# Patient Record
Sex: Female | Born: 1990 | Race: Black or African American | Hispanic: No | Marital: Single | Smoking: Former smoker
Health system: Southern US, Community
[De-identification: ages and names within clinical notes are randomized; demographics above are authoritative.]

## PROBLEM LIST (undated history)

## (undated) DIAGNOSIS — F329 Major depressive disorder, single episode, unspecified: Secondary | ICD-10-CM

## (undated) DIAGNOSIS — F4310 Post-traumatic stress disorder, unspecified: Secondary | ICD-10-CM

## (undated) DIAGNOSIS — S0300XA Dislocation of jaw, unspecified side, initial encounter: Secondary | ICD-10-CM

## (undated) DIAGNOSIS — J45909 Unspecified asthma, uncomplicated: Secondary | ICD-10-CM

## (undated) DIAGNOSIS — L309 Dermatitis, unspecified: Secondary | ICD-10-CM

## (undated) DIAGNOSIS — F909 Attention-deficit hyperactivity disorder, unspecified type: Secondary | ICD-10-CM

## (undated) DIAGNOSIS — F419 Anxiety disorder, unspecified: Secondary | ICD-10-CM

## (undated) DIAGNOSIS — F32A Depression, unspecified: Secondary | ICD-10-CM

## (undated) HISTORY — PX: FOOT SURGERY: SHX648

## (undated) HISTORY — PX: OTHER SURGICAL HISTORY: SHX169

## (undated) HISTORY — DX: Dermatitis, unspecified: L30.9

## (undated) HISTORY — DX: Major depressive disorder, single episode, unspecified: F32.9

## (undated) HISTORY — DX: Depression, unspecified: F32.A

## (undated) HISTORY — DX: Post-traumatic stress disorder, unspecified: F43.10

## (undated) HISTORY — DX: Anxiety disorder, unspecified: F41.9

## (undated) HISTORY — DX: Attention-deficit hyperactivity disorder, unspecified type: F90.9

## (undated) HISTORY — DX: Dislocation of jaw, unspecified side, initial encounter: S03.00XA

---
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
## 2009-03-04 ENCOUNTER — Ambulatory Visit: Payer: Self-pay | Admitting: Internal Medicine

## 2009-03-19 ENCOUNTER — Ambulatory Visit: Payer: Self-pay | Admitting: Internal Medicine

## 2009-04-17 ENCOUNTER — Ambulatory Visit: Payer: Self-pay | Admitting: Internal Medicine

## 2010-01-17 ENCOUNTER — Emergency Department (HOSPITAL_COMMUNITY): Admission: EM | Admit: 2010-01-17 | Discharge: 2010-01-17 | Payer: Self-pay | Admitting: Emergency Medicine

## 2011-09-14 ENCOUNTER — Telehealth: Payer: Self-pay

## 2011-09-14 NOTE — Telephone Encounter (Signed)
Pt is requesting refill on generic ritalin  Call [phone]

## 2011-09-15 MED ORDER — METHYLPHENIDATE HCL 10 MG PO TABS: 10.0000 mg | ORAL_TABLET | Freq: Two times a day (BID) | ORAL | Status: DC

## 2011-09-15 NOTE — Telephone Encounter (Signed)
LMOM THAT RX IS READY FOR PICKUP 

## 2011-09-15 NOTE — Telephone Encounter (Signed)
Pt last seen for this 07/04/11. OK to RF x 6 mos.

## 2011-09-15 NOTE — Telephone Encounter (Signed)
RF on Ritalin.

## 2011-10-09 ENCOUNTER — Ambulatory Visit (INDEPENDENT_AMBULATORY_CARE_PROVIDER_SITE_OTHER): Payer: 59 | Admitting: Family Medicine

## 2011-10-09 VITALS — BP 126/77 | HR 108 | Temp 99.8°F | Resp 16 | Ht 66.0 in | Wt 233.6 lb

## 2011-10-09 DIAGNOSIS — F329 Major depressive disorder, single episode, unspecified: Secondary | ICD-10-CM

## 2011-10-09 DIAGNOSIS — R059 Cough, unspecified: Secondary | ICD-10-CM

## 2011-10-09 DIAGNOSIS — J310 Chronic rhinitis: Secondary | ICD-10-CM

## 2011-10-09 DIAGNOSIS — F3289 Other specified depressive episodes: Secondary | ICD-10-CM

## 2011-10-09 DIAGNOSIS — R112 Nausea with vomiting, unspecified: Secondary | ICD-10-CM

## 2011-10-09 DIAGNOSIS — F32A Depression, unspecified: Secondary | ICD-10-CM

## 2011-10-09 DIAGNOSIS — J329 Chronic sinusitis, unspecified: Secondary | ICD-10-CM

## 2011-10-09 DIAGNOSIS — R05 Cough: Secondary | ICD-10-CM

## 2011-10-09 MED ORDER — BENZONATATE 100 MG PO CAPS: 100.0000 mg | ORAL_CAPSULE | Freq: Two times a day (BID) | ORAL | Status: AC | PRN

## 2011-10-09 MED ORDER — BUPROPION HCL ER (SR) 150 MG PO TB12: 150.0000 mg | ORAL_TABLET | Freq: Every day | ORAL | Status: DC

## 2011-10-09 MED ORDER — PROMETHAZINE HCL 12.5 MG PO TABS: 12.5000 mg | ORAL_TABLET | Freq: Three times a day (TID) | ORAL | Status: AC | PRN

## 2011-10-09 MED ORDER — AZITHROMYCIN 250 MG PO TABS: ORAL_TABLET | ORAL | Status: AC

## 2011-10-09 NOTE — Progress Notes (Signed)
[facility]:  Office Visit  Chief Complaint:  Chief Complaint  Patient presents with  . Cough  . Emesis  . Medication Problem    side effects from celexa    HPI: Morgan [name] is a 21 y.o. female who complains of   1.Flu like sxs  X 6 days, sore throat, coughing up white mucus, nausea and vomiting, subjective fevers/chills, sinus pressure.  LMP 09/11/11. Has not had sex in last 1 month.  2. She would like to change her Celexa because if sideeffects. She had spoken to Maralyn Sago about this and they ahad agreed that perhaps going back to her Wellbutrin would help with her depression and the SE of weight gain and derease libido with the Celexa. She has been on Wellbutrin before and it seemed to work, does not remember why she was taken off of it. Does not appear to be related to an allergy. Denies mania, SI/HI, hallucinations.   Past Medical History  Diagnosis Date  . Depression   . ADHD (attention deficit hyperactivity disorder)   . Anxiety   . PTSD (post-traumatic stress disorder)   . Eczema   . TMJ (dislocation of temporomandibular joint)    Past Surgical History  Procedure Date  . Addenoid    History   Social History  . Marital Status: Single    Spouse Name: N/A    Number of Children: N/A  . Years of Education: N/A   Social History Main Topics  . Smoking status: Never Smoker   . Smokeless tobacco: None  . Alcohol Use: No  . Drug Use: No  . Sexually Active: Not Currently    Birth Control/ Protection: Condom   Other Topics Concern  . None   Social History Narrative  . None   Family History  Problem Relation Age of Onset  . Depression Mother   . Hypertension Mother   . Hypertension Maternal Grandmother   . Hypertension Maternal Grandfather   . Diabetes Maternal Grandfather   . Hypertension Paternal Grandmother   . Diabetes Paternal Grandmother    Allergies  Allergen Reactions  . Sulfa Antibiotics Itching   Prior to Admission medications     Medication Sig Start Date End Date Taking? Authorizing Provider  citalopram (CELEXA) 20 MG tablet Take 20 mg by mouth daily.   Yes Historical Provider, MD  methylphenidate (RITALIN) 10 MG tablet Take 1 tablet (10 mg total) by mouth 2 (two) times daily. 09/15/11 10/15/11 Yes Rickard Patience, PA-C     ROS: The patient denies fevers, chills, night sweats, unintentional weight loss, chest pain, palpitations, wheezing, dyspnea on exertion, abdominal pain, dysuria, hematuria, melena, numbness, weakness, or tingling. + nausea/vomiting  All other systems have been reviewed and were otherwise negative with the exception of those mentioned in the HPI and as above.    PHYSICAL EXAM: Filed Vitals:   10/09/11 1351  BP: 126/77  Pulse: 108  Temp: 99.8 F (37.7 C)  Resp: 16   Filed Vitals:   10/09/11 1351  Height: 5\' 6"  (1.676 m)  Weight: 233 lb 9.6 oz (105.96 kg)   Body mass index is 37.70 kg/(m^2).  General: Alert, no acute distress, obese HEENT:  Normocephalic, atraumatic, oropharynx patent. TM nl, Sinus tender, boggy nares Cardiovascular:  Regular rate and rhythm, no rubs murmurs or gallops.  No Carotid bruits, radial pulse intact. No pedal edema.  Respiratory: Clear to auscultation bilaterally.  No wheezes, rales, or rhonchi.  No cyanosis, no use of accessory  musculature GI: No organomegaly, abdomen is soft and non-tender, positive bowel sounds.  No masses. Skin: No rashes. Neurologic: Facial musculature symmetric. Psychiatric: Patient is appropriate throughout our interaction. Lymphatic: No cervical lymphadenopathy Musculoskeletal: Gait intact.   LABS: No results found for this or any previous visit.   EKG/XRAY:   Primary read interpreted by Dr. Conley Rolls at [facility].   ASSESSMENT/PLAN: Encounter Diagnoses  Name Primary?  . Sinusitis Yes  . Rhinitis   . Cough   . Nausea & vomiting   . Depression     1. Rx Z pack 2. Nasal flushes 3. Rx Tessalon Perles 4. Rx. Phenergen 5. Change  meds from Celexa to Wellbutrin XR 150 mg daily  F/u in 1 month for reevaluation of Wellbutrin effectiveness and SEs if any   Taylie Helder PHUONG, DO 10/09/2011 2:45 PM

## 2011-10-26 ENCOUNTER — Telehealth: Payer: Self-pay

## 2011-10-26 NOTE — Telephone Encounter (Signed)
PT REQUESTING RITALIN REFILL    BEST PHONE [phone]

## 2011-10-28 MED ORDER — METHYLPHENIDATE HCL 10 MG PO TABS: 10.0000 mg | ORAL_TABLET | Freq: Two times a day (BID) | ORAL | Status: DC

## 2011-10-28 NOTE — Telephone Encounter (Signed)
Ritalin RX is ready.

## 2011-10-28 NOTE — Telephone Encounter (Signed)
Ritalin RX ready

## 2011-10-28 NOTE — Telephone Encounter (Signed)
[facility] notifying patient that rx is in p/up drawer.

## 2011-11-03 ENCOUNTER — Ambulatory Visit (INDEPENDENT_AMBULATORY_CARE_PROVIDER_SITE_OTHER): Payer: 59 | Admitting: Emergency Medicine

## 2011-11-03 VITALS — BP 106/72 | HR 118 | Temp 100.3°F | Resp 16 | Ht 65.5 in | Wt 228.0 lb

## 2011-11-03 DIAGNOSIS — F909 Attention-deficit hyperactivity disorder, unspecified type: Secondary | ICD-10-CM

## 2011-11-03 DIAGNOSIS — R1110 Vomiting, unspecified: Secondary | ICD-10-CM

## 2011-11-03 DIAGNOSIS — F329 Major depressive disorder, single episode, unspecified: Secondary | ICD-10-CM

## 2011-11-03 MED ORDER — ONDANSETRON 8 MG PO TBDP: 8.0000 mg | ORAL_TABLET | Freq: Three times a day (TID) | ORAL | Status: AC | PRN

## 2011-11-03 MED ORDER — ONDANSETRON 4 MG PO TBDP
8.0000 mg | ORAL_TABLET | Freq: Once | ORAL | Status: AC
  Administered 2011-11-03: 8 mg via ORAL

## 2011-11-03 NOTE — Progress Notes (Signed)
  Subjective:    Patient ID: Morgan [name], female    DOB: April 05, 1991, 21 y.o.   MRN: [id number]  HPI patient enters with onset approximately midnight of nausea vomiting followed by diarrhea. Her boyfriend was sick last week with a similar type illness she has not had any recent travel but she did take a course of Zithromax 10/09/1911. She's had frequent loose stools through the day today.    Review of Systems she has a history of ADHD and a history of depression on treatment.     Objective:   Physical Exam  Constitutional:       Patient appears ill but nontoxic. Mucous membranes are moist.  HENT:  Head: Normocephalic.  Right Ear: External ear normal.  Left Ear: External ear normal.  Eyes: Pupils are equal, round, and reactive to light.  Neck: Neck supple. No tracheal deviation present. No thyromegaly present.  Cardiovascular: Normal rate and regular rhythm.   Pulmonary/Chest: Breath sounds normal. No respiratory distress. She has no wheezes. She has no rales. She exhibits no tenderness.  Abdominal: Soft. She exhibits no distension and no mass. There is no tenderness. There is no rebound and no guarding.          Assessment & Plan:   Patient presents with fever myalgias vomiting and diarrhea. I doubt this represents C. difficile. She has not any blood or mucus in her diarrhea and her antibiotic was a Z-Pak. Her boyfriend was ill with a similar illness recently.

## 2011-11-03 NOTE — Patient Instructions (Signed)

## 2011-11-30 ENCOUNTER — Ambulatory Visit (INDEPENDENT_AMBULATORY_CARE_PROVIDER_SITE_OTHER): Payer: 59 | Admitting: Family Medicine

## 2011-11-30 VITALS — BP 115/74 | HR 98 | Temp 98.3°F | Resp 18 | Ht 66.0 in | Wt 232.0 lb

## 2011-11-30 DIAGNOSIS — F4310 Post-traumatic stress disorder, unspecified: Secondary | ICD-10-CM

## 2011-11-30 MED ORDER — LAMOTRIGINE 25 MG PO TABS: 25.0000 mg | ORAL_TABLET | Freq: Two times a day (BID) | ORAL | Status: DC

## 2011-11-30 NOTE — Patient Instructions (Signed)
Post-Traumatic Stress Disorder  If you have been diagnosed with post-traumatic stress disorder (PTSD), you have probably experienced a traumatic event in your life. These events are usually outside of the range of normal human experience and would negatively impact any normal person.   CAUSES   A person can get PTSD after living through or seeing a dangerous event such as:   An automobile accident.   War.   Natural disaster.   Rape.   Domestic violence.   Any event where there has been a threat to life.  PTSD is a real illness. PTSD Can happen to anyone at any age. Children get PTSD too. A doctor, or mental health professional with experience in treating PTSD can help you.  SYMPTOMS   Not all symptoms may be present in any one person.   Distressing dreams.   Flashback: feeling the frightening event is happening again.   Avoiding activities, places, and people that remind you of the event.   Avoiding thoughts and feelings associated with the event.   Having frightening thoughts you cannot control.   Feeling on the edge with increased alertness and vigilance.   Trouble sleeping.   Feeling alone, detached from others.   Angry outbursts.   Feeling worried, guilty, or sad.   Having thoughts of hurting yourself or others.  PTSD may start soon after a frightening event or months or years later. Many war veterans have PTSD. Drinking alcohol or using drugs will not help PTSD and may even make it worse.   TREATMENT   PTSD can be treated. Treatment may include "talk" therapy, medicine, or both. Either a doctor or a mental health professional who is experienced in treating PTSD can help you. Early diagnosis and treatment is best and can show more rapid improvement. Get help if you or a loved one are thinking of hurting yourself. Call your local emergency medical services if you need help immediately.  Document Released: 04/12/2001 Document Revised: 07/07/2011 Document Reviewed: 03/26/2008  [employer] Patient  Information 2012 [employer].
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
## 2011-12-18 ENCOUNTER — Ambulatory Visit (INDEPENDENT_AMBULATORY_CARE_PROVIDER_SITE_OTHER): Payer: 59 | Admitting: Family Medicine

## 2011-12-18 VITALS — BP 118/73 | HR 94 | Temp 98.0°F | Resp 16 | Ht 66.0 in | Wt 235.0 lb

## 2011-12-18 DIAGNOSIS — Z79899 Other long term (current) drug therapy: Secondary | ICD-10-CM

## 2011-12-18 DIAGNOSIS — F4310 Post-traumatic stress disorder, unspecified: Secondary | ICD-10-CM

## 2011-12-18 DIAGNOSIS — G4733 Obstructive sleep apnea (adult) (pediatric): Secondary | ICD-10-CM

## 2011-12-18 DIAGNOSIS — Z5189 Encounter for other specified aftercare: Secondary | ICD-10-CM

## 2011-12-18 MED ORDER — LAMOTRIGINE 25 MG PO TABS: 50.0000 mg | ORAL_TABLET | Freq: Two times a day (BID) | ORAL | Status: DC

## 2011-12-18 NOTE — Progress Notes (Signed)
Patient Name: Morgan [name] Date of Birth: January 20, 1991 Medical Record Number: [id number] Gender: female Date of Encounter: 12/18/2011  History of Present Illness:  Morgan [name] is a 21 y.o. very pleasant female patient who presents with the following:  Here to follow- up on her PTSD/ depression.  She was here on the 1st and started on lamictal.  She has still not called the psychiatrist whose contact information she was given by Dr. Milus Glazier.  She feels that the lamictal is helping to stabilize her moods some, but she still will fluctuate between feeling a bit "up" and then "down" every 3 or 4 days.  However, she does not feel that either her ups or her downs are very severe.  She denies any SI or HI.    Also concernred about snoring- her bed partner has noted that she snores a lot and seems to stop breathing sometimes at night.  She has been told that she had a deviated septum in the past.    Patient Active Problem List  Diagnoses  . ADHD (attention deficit hyperactivity disorder)  . Depression   Past Medical History  Diagnosis Date  . Depression   . ADHD (attention deficit hyperactivity disorder)   . Anxiety   . PTSD (post-traumatic stress disorder)   . Eczema   . TMJ (dislocation of temporomandibular joint)    Past Surgical History  Procedure Date  . Addenoid    History  Substance Use Topics  . Smoking status: Never Smoker   . Smokeless tobacco: Not on file  . Alcohol Use: No   Family History  Problem Relation Age of Onset  . Depression Mother   . Hypertension Mother   . Hypertension Maternal Grandmother   . Hypertension Maternal Grandfather   . Diabetes Maternal Grandfather   . Hypertension Paternal Grandmother   . Diabetes Paternal Grandmother    Allergies  Allergen Reactions  . Sulfa Antibiotics Itching    Medication list has been reviewed and updated.  Review of Systems: As per HPI- otherwise negative.   Physical Examination: Filed Vitals:   12/18/11 1047  BP: 118/73  Pulse: 94  Temp: 98 F (36.7 C)  TempSrc: Oral  Resp: 16  Height: 5\' 6"  (1.676 m)  Weight: 235 lb (106.595 kg)    Body mass index is 37.93 kg/(m^2).  GEN: WDWN, NAD, Non-toxic, A & O x 3, overweight- seems to give the impression of being heavier than she really is due to large breasts HEENT: Atraumatic, Normocephalic. Neck supple. No masses, No LAD.  TM, oropharynx wnl.  She has a small mouth and large tonsils.  Ears and Nose: No external deformity. CV: RRR, No M/G/R. No JVD. No thrill. No extra heart sounds. PULM: CTA B, no wheezes, crackles, rhonchi. No retractions. No resp. distress. No accessory muscle use. EXTR: No c/c/e NEURO Normal gait.  PSYCH: Normally interactive. Conversant. Not depressed or anxious appearing.  Calm demeanor.    Assessment and Plan: 1. PTSD (post-traumatic stress disorder)  lamoTRIgine (LAMICTAL) 25 MG tablet  2. OSA (obstructive sleep apnea)  Nocturnal polysomnography (NPSG)  3. Medication management  Basic metabolic panel   Did increase her lamictal to 50mg  BID today.  I explained that we do need to have a psychiatrist involved in her care, as managing depression with anti- epileptics should be done with at least the input of a psychiatrist.  She did agree to call tomorrow and work on setting up an appointment as soon as she can.  Wanted to  know about adding celexa to her regimen- again I let her know that a psychiatrist would be the best person to ask, and she understood  Check BMP to monitor creatinine (lamictal use) Referral for sleep study- suspect that she may have OSA.  Encouraged to lose a few pounds as well   Follow- up pending labs- Sooner if worse.

## 2011-12-19 LAB — BASIC METABOLIC PANEL
BUN: 10 mg/dL (ref 4–21)
BUN: 10 mg/dL (ref 4–21)
BUN: 10 mg/dL (ref 4–21)
Creatinine: 0.6 mg/dL (ref 0.5–1.1)
Potassium: 4.1 mmol/L (ref 3.4–5.3)

## 2011-12-23 ENCOUNTER — Encounter: Payer: Self-pay | Admitting: Family Medicine

## 2011-12-23 NOTE — Progress Notes (Signed)
These were sent to [facility]- will enter by hand

## 2012-01-30 ENCOUNTER — Telehealth: Payer: Self-pay

## 2012-01-30 MED ORDER — METHYLPHENIDATE HCL 10 MG PO TABS: 10.0000 mg | ORAL_TABLET | Freq: Two times a day (BID) | ORAL | Status: DC

## 2012-01-30 NOTE — Telephone Encounter (Signed)
Left message rx ready to be picked up

## 2012-01-30 NOTE — Telephone Encounter (Signed)
Done and printed

## 2012-01-30 NOTE — Telephone Encounter (Signed)
Pt saw Dr L 11/30/11 and addressed ADD. Pt last RF 10/28/11. Dr Patsy Lager did D/W pt need to start seeing a psychiatrist for her other issues at 5/19 OV.

## 2012-01-30 NOTE — Telephone Encounter (Signed)
PT IN NEED OF HER RITALIN PLEASE CALL [phone]

## 2012-02-11 ENCOUNTER — Encounter: Payer: Self-pay | Admitting: Family Medicine

## 2012-02-11 ENCOUNTER — Ambulatory Visit (INDEPENDENT_AMBULATORY_CARE_PROVIDER_SITE_OTHER): Payer: 59 | Admitting: Family Medicine

## 2012-02-11 VITALS — BP 116/74 | HR 81 | Temp 98.1°F | Resp 16 | Ht 65.5 in | Wt 244.0 lb

## 2012-02-11 DIAGNOSIS — F32A Depression, unspecified: Secondary | ICD-10-CM

## 2012-02-11 DIAGNOSIS — F419 Anxiety disorder, unspecified: Secondary | ICD-10-CM

## 2012-02-11 DIAGNOSIS — F411 Generalized anxiety disorder: Secondary | ICD-10-CM

## 2012-02-11 DIAGNOSIS — L309 Dermatitis, unspecified: Secondary | ICD-10-CM

## 2012-02-11 DIAGNOSIS — F329 Major depressive disorder, single episode, unspecified: Secondary | ICD-10-CM

## 2012-02-11 DIAGNOSIS — L259 Unspecified contact dermatitis, unspecified cause: Secondary | ICD-10-CM

## 2012-02-11 MED ORDER — CITALOPRAM HYDROBROMIDE 20 MG PO TABS: ORAL_TABLET | ORAL | Status: DC

## 2012-02-11 MED ORDER — TRIAMCINOLONE ACETONIDE 0.1 % EX LOTN: TOPICAL_LOTION | Freq: Two times a day (BID) | CUTANEOUS | Status: AC

## 2012-02-11 NOTE — Patient Instructions (Addendum)
Use the lotion twice daily on the areas of eczema. If it is not improving we will refer you to a dermatologist.  Take the Celexa one daily for 2 weeks, then increase to 2 daily if tolerated. In about 3-4 weeks I would like you to followup with Benny Lennert PA who has seen you in the past.  Consider getting some psychological counseling. I am giving you several names you can consider contacting, or else find someone else of your choosing.  Continue getting your regular physical exercise. Urge you to try to be physically, emotionally, relationally., and spiritually healthy.

## 2012-02-11 NOTE — Progress Notes (Signed)
Subjective: Patient has a history of extensive allergies. She's been allergy tested in the past and was positive for "everything". She has been having a lot of eczema in recent months. It is extensively on her right leg and on both thighs. She itches a lot. She does not know what causes the eczema to flare. She has used some triamcinolone cream in the past it appears.  She also is having problems with anxiety and depression. She wants to be back on her Celexa but says that it raised her weight. 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 She does try to walk for 3 miles in the evenings she has been more depressed. She is living with a significant other. She was raised in a Christian home, but apparently has had problems from the legalism from the church she was raised in. She would like a counselor, but doesn't feel she can afford a lot of the practices.  Objective: Eczema on legs as noted above.  Assessment: Eczema Anxiety Depression History of possible ADD  Plan:   Resume the Celexa 20 mg daily, but increase to 40 mg in 2 weeks. Followup with Ricka Burdock in one month. She considers her primary.  Triamcinolone lotion twice a day  If depression gets intensely worse anytime she is to come back. Recommended counseling.

## 2012-05-20 ENCOUNTER — Ambulatory Visit (INDEPENDENT_AMBULATORY_CARE_PROVIDER_SITE_OTHER): Payer: 59 | Admitting: Family Medicine

## 2012-05-20 VITALS — BP 119/77 | HR 90 | Temp 97.9°F | Resp 17 | Ht 66.5 in | Wt 232.0 lb

## 2012-05-20 DIAGNOSIS — J309 Allergic rhinitis, unspecified: Secondary | ICD-10-CM

## 2012-05-20 DIAGNOSIS — N946 Dysmenorrhea, unspecified: Secondary | ICD-10-CM

## 2012-05-20 DIAGNOSIS — Z3009 Encounter for other general counseling and advice on contraception: Secondary | ICD-10-CM

## 2012-05-20 DIAGNOSIS — R5383 Other fatigue: Secondary | ICD-10-CM

## 2012-05-20 DIAGNOSIS — R1114 Bilious vomiting: Secondary | ICD-10-CM

## 2012-05-20 LAB — GLUCOSE, POCT (MANUAL RESULT ENTRY): POC Glucose: 66 mg/dl — AB (ref 70–99)

## 2012-05-20 LAB — POCT CBC
Granulocyte percent: 59.5 %G (ref 37–80)
HCT, POC: 42.4 % (ref 37.7–47.9)
Hemoglobin: 12.9 g/dL (ref 12.2–16.2)
Lymph, poc: 2.6 (ref 0.6–3.4)
MCHC: 30.4 g/dL — AB (ref 31.8–35.4)
MCV: 84.3 fL (ref 80–97)
POC Granulocyte: 4.6 (ref 2–6.9)

## 2012-05-20 LAB — POCT URINE PREGNANCY: Preg Test, Ur: NEGATIVE

## 2012-05-20 MED ORDER — FLUTICASONE PROPIONATE 50 MCG/ACT NA SUSP: 2.0000 | Freq: Every day | NASAL | Status: DC

## 2012-05-20 MED ORDER — ETONOGESTREL-ETHINYL ESTRADIOL 0.12-0.015 MG/24HR VA RING: VAGINAL_RING | VAGINAL | Status: DC

## 2012-05-20 NOTE — Patient Instructions (Addendum)
Call an OB- GYN office of your choice and make an appointment.  You may want to research Implanon and the Mirena and Paraguard IUD prior to your call, as prior approval from your insurance may be required.  That way, when you call you can tell them what you are most interested in using.    [facility] [place].

## 2012-05-20 NOTE — Progress Notes (Signed)
[facility] [address], [place] [address] [phone]  Date:  05/20/2012   Name:  Morgan [name]   DOB:  [birth year]   MRN:  [id number]  PCP:  Tally Due, MD    Chief Complaint: Dizziness, Abdominal Cramping, Generalized Body Aches and Fatigue   History of Present Illness:  Morgan [name] is a 21 y.o. very pleasant female patient who presents with the following:  She has been "feeling terrible" for the last 5 or 6 days.  Her menses started this past Friday, and her flow has been waxing and waning.  Her menses came a couple of days earlier than expected but this is not unusual for her.   She has felt "faint, moody, fatigued and out of it."  She has noted abdominal cramps.   Also has noted diarrhea up to 3 times a day- this also is typical with her menses.    He current constellation of sympotms is similar to the symptoms that she usually has around the time of her menses.  However, this is worse than usual.  "I have not gotten up out of bed in days."   No urinary symtpoms.  No vomiting.  No breast tenderness.  No vaginal symptoms such as discharge.  She uses condoms for contraception.  She would like to go back on hormonal contraception if possible to help reduce her symptoms.    She last used OCP about 5 years ago and did not experience worsening of her headaches. See discussion of HA below.  She would also be interested in a more long- term form on contraception such as an IUD. She feels certain that she will never want to have children, but understands that she probably will not be given a BTL due to her age.    Patient Active Problem List  Diagnosis  . ADHD (attention deficit hyperactivity disorder)  . Depression    Past Medical History  Diagnosis Date  . Depression   . ADHD (attention deficit hyperactivity disorder)   . Anxiety   . PTSD (post-traumatic stress disorder)   . Eczema   . TMJ (dislocation of temporomandibular joint)     Past  Surgical History  Procedure Date  . Addenoid     History  Substance Use Topics  . Smoking status: Never Smoker   . Smokeless tobacco: Not on file  . Alcohol Use: No    Family History  Problem Relation Age of Onset  . Depression Mother   . Hypertension Mother   . Hypertension Maternal Grandmother   . Hypertension Maternal Grandfather   . Diabetes Maternal Grandfather   . Hypertension Paternal Grandmother   . Diabetes Paternal Grandmother     Allergies  Allergen Reactions  . Sulfa Antibiotics Itching    Medication list has been reviewed and updated.  Current Outpatient Prescriptions on File Prior to Visit  Medication Sig Dispense Refill  . citalopram (CELEXA) 20 MG tablet Take 1 daily for 2 weeks, then increase to 2 daily for anxiety and depression  60 tablet  2  . clonazePAM (KLONOPIN) 0.5 MG tablet Take 0.5 mg by mouth 2 (two) times daily as needed.      . Multiple Vitamin (MULTIVITAMIN) tablet Take 1 tablet by mouth daily.      Marland Kitchen triamcinolone lotion (KENALOG) 0.1 % Apply topically 2 (two) times daily.  60 mL  4  . buPROPion (WELLBUTRIN SR) 150 MG 12 hr tablet Take 1 tablet (150 mg total)  by mouth daily.  30 tablet  1  . lamoTRIgine (LAMICTAL) 25 MG tablet Take 2 tablets (50 mg total) by mouth 2 (two) times daily.  120 tablet  2  . methylphenidate (RITALIN) 10 MG tablet Take 1 tablet (10 mg total) by mouth 2 (two) times daily.  60 tablet  0    Review of Systems:  As per HPI- otherwise negative. Interested in the nuva- ring.  She does not smoke, no history of DVT/ PE, cancer or HTN.  She does get migraine HA, but it does not seem that she gets an aura. She does note sensitivity to light with her migraines, but only has visual changes "when I press on my eyes" which she does to relieve the HAs.    Physical Examination: Filed Vitals:   05/20/12 1034  BP: 119/77  Pulse: 90  Temp: 97.9 F (36.6 C)  Resp: 17   Filed Vitals:   05/20/12 1034  Height: 5' 6.5" (1.689  m)  Weight: 232 lb (105.235 kg)   Body mass index is 36.88 kg/(m^2). Ideal Body Weight: Weight in (lb) to have BMI = 25: 156.9   GEN: WDWN, NAD, Non-toxic, A & O x 3, obese HEENT: Atraumatic, Normocephalic. Neck supple. No masses, No LAD. PEERL, EOMI Ears and Nose: No external deformity. CV: RRR, No M/G/R. No JVD. No thrill. No extra heart sounds. PULM: CTA B, no wheezes, crackles, rhonchi. No retractions. No resp. distress. No accessory muscle use. ABD: S, NT, ND +BS. No rebound. No HSM.   abdominal exam is benign at this time EXTR: No c/c/e NEURO Normal gait.  PSYCH: Normally interactive. Conversant. Not depressed or anxious appearing.  Calm demeanor.   Results for orders placed in visit on 05/20/12  POCT CBC      Component Value Range   WBC 7.8  4.6 - 10.2 K/uL   Lymph, poc 2.6  0.6 - 3.4   POC LYMPH PERCENT 33.1  10 - 50 %L   MID (cbc) 0.6  0 - 0.9   POC MID % 7.4  0 - 12 %M   POC Granulocyte 4.6  2 - 6.9   Granulocyte percent 59.5  37 - 80 %G   RBC 5.03  4.04 - 5.48 M/uL   Hemoglobin 12.9  12.2 - 16.2 g/dL   HCT, POC 78.2  95.6 - 47.9 %   MCV 84.3  80 - 97 fL   MCH, POC 25.6 (*) 27 - 31.2 pg   MCHC 30.4 (*) 31.8 - 35.4 g/dL   RDW, POC 21.3     Platelet Count, POC 362  142 - 424 K/uL   MPV 9.9  0 - 99.8 fL  GLUCOSE, POCT (MANUAL RESULT ENTRY)      Component Value Range   POC Glucose 66 (*) 70 - 99 mg/dl  POCT URINE PREGNANCY      Component Value Range   Preg Test, Ur Negative     Given juice to drink in clinic- she felt better.  She is not driving home- she is being picked up.  Cautioned her to eat lunch before she tries to drive.    Assessment and Plan: 1. Fatigue  POCT CBC, POCT glucose (manual entry), POCT urine pregnancy  2. Dysmenorrhea    3. Counseling for birth control, oral contraceptives  etonogestrel-ethinyl estradiol (NUVARING) 0.12-0.015 MG/24HR vaginal ring  4. Allergic rhinitis  fluticasone (FLONASE) 50 MCG/ACT nasal spray   Morgan [name] is here today  with malaise associated with  menstruation.  She wishes to start nuvaring for birth control.  She denies any history of DVT/PE, migraine with aura, cancer, blood clotting disorder or tobacco use.  She can start the ring right away or wait until her menses are over.  Instructed as to importance of taking medication as directed, and encouraged to use condoms as well if at risk for STIs.   See pt instructions, she will talk to OBG about IUD, etc.    Also refilled her flonase per her request.    Abbe Amsterdam, MD

## 2012-06-14 ENCOUNTER — Emergency Department (HOSPITAL_COMMUNITY)
Admission: EM | Admit: 2012-06-14 | Discharge: 2012-06-14 | Disposition: A | Discharge status: Home / Self Care | Payer: 59 | Attending: Emergency Medicine | Admitting: Emergency Medicine

## 2012-06-14 ENCOUNTER — Encounter (HOSPITAL_COMMUNITY): Payer: Self-pay | Admitting: *Deleted

## 2012-06-14 DIAGNOSIS — Z79899 Other long term (current) drug therapy: Secondary | ICD-10-CM | POA: Insufficient documentation

## 2012-06-14 DIAGNOSIS — Y9389 Activity, other specified: Secondary | ICD-10-CM | POA: Insufficient documentation

## 2012-06-14 DIAGNOSIS — F3289 Other specified depressive episodes: Secondary | ICD-10-CM | POA: Insufficient documentation

## 2012-06-14 DIAGNOSIS — F329 Major depressive disorder, single episode, unspecified: Secondary | ICD-10-CM | POA: Insufficient documentation

## 2012-06-14 DIAGNOSIS — F4310 Post-traumatic stress disorder, unspecified: Secondary | ICD-10-CM | POA: Insufficient documentation

## 2012-06-14 DIAGNOSIS — S134XXA Sprain of ligaments of cervical spine, initial encounter: Secondary | ICD-10-CM

## 2012-06-14 DIAGNOSIS — S139XXA Sprain of joints and ligaments of unspecified parts of neck, initial encounter: Secondary | ICD-10-CM | POA: Insufficient documentation

## 2012-06-14 DIAGNOSIS — Z872 Personal history of diseases of the skin and subcutaneous tissue: Secondary | ICD-10-CM | POA: Insufficient documentation

## 2012-06-14 DIAGNOSIS — J45909 Unspecified asthma, uncomplicated: Secondary | ICD-10-CM | POA: Insufficient documentation

## 2012-06-14 DIAGNOSIS — Z8719 Personal history of other diseases of the digestive system: Secondary | ICD-10-CM | POA: Insufficient documentation

## 2012-06-14 DIAGNOSIS — F909 Attention-deficit hyperactivity disorder, unspecified type: Secondary | ICD-10-CM | POA: Insufficient documentation

## 2012-06-14 DIAGNOSIS — IMO0002 Reserved for concepts with insufficient information to code with codable children: Secondary | ICD-10-CM | POA: Insufficient documentation

## 2012-06-14 DIAGNOSIS — F411 Generalized anxiety disorder: Secondary | ICD-10-CM | POA: Insufficient documentation

## 2012-06-14 HISTORY — DX: Unspecified asthma, uncomplicated: J45.909

## 2012-06-14 MED ORDER — CYCLOBENZAPRINE HCL 10 MG PO TABS: 10.0000 mg | ORAL_TABLET | Freq: Two times a day (BID) | ORAL | Status: DC | PRN

## 2012-06-14 MED ORDER — OXYCODONE-ACETAMINOPHEN 5-325 MG PO TABS: 1.0000 | ORAL_TABLET | Freq: Four times a day (QID) | ORAL | Status: DC | PRN

## 2012-06-14 MED ORDER — OXYCODONE-ACETAMINOPHEN 5-325 MG PO TABS
1.0000 | ORAL_TABLET | Freq: Once | ORAL | Status: AC
  Administered 2012-06-14: 1 via ORAL
  Filled 2012-06-14: qty 1

## 2012-06-14 MED ORDER — LORAZEPAM 0.5 MG PO TABS
0.5000 mg | ORAL_TABLET | Freq: Once | ORAL | Status: AC
  Administered 2012-06-14: 0.5 mg via ORAL
  Filled 2012-06-14: qty 1

## 2012-06-14 MED ORDER — METHOCARBAMOL 500 MG PO TABS
500.0000 mg | ORAL_TABLET | Freq: Once | ORAL | Status: AC
  Administered 2012-06-14: 500 mg via ORAL
  Filled 2012-06-14: qty 1
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
## 2012-06-14 NOTE — ED Provider Notes (Signed)
Medical screening examination/treatment/procedure(s) were performed by non-physician practitioner and as supervising physician I was immediately available for consultation/collaboration.   Gwyneth Sprout, MD 06/14/12 1040
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
## 2012-09-09 ENCOUNTER — Ambulatory Visit (INDEPENDENT_AMBULATORY_CARE_PROVIDER_SITE_OTHER): Payer: 59 | Admitting: Family Medicine

## 2012-09-09 VITALS — BP 126/75 | HR 87 | Temp 99.1°F | Resp 16 | Ht 66.0 in | Wt 243.0 lb

## 2012-09-09 DIAGNOSIS — T7800XA Anaphylactic reaction due to unspecified food, initial encounter: Secondary | ICD-10-CM

## 2012-09-09 DIAGNOSIS — F329 Major depressive disorder, single episode, unspecified: Secondary | ICD-10-CM

## 2012-09-09 DIAGNOSIS — F411 Generalized anxiety disorder: Secondary | ICD-10-CM

## 2012-09-09 MED ORDER — CITALOPRAM HYDROBROMIDE 40 MG PO TABS: 60.0000 mg | ORAL_TABLET | Freq: Every day | ORAL | Status: DC

## 2012-09-09 MED ORDER — CLONAZEPAM 0.5 MG PO TABS: 0.5000 mg | ORAL_TABLET | Freq: Two times a day (BID) | ORAL | Status: DC | PRN

## 2012-09-09 MED ORDER — EPINEPHRINE 0.3 MG/0.3ML IJ DEVI: 0.3000 mg | Freq: Once | INTRAMUSCULAR | Status: DC

## 2012-09-09 NOTE — Progress Notes (Signed)
Subjective:    Patient ID: Morgan [name], female    DOB: 1990/09/17, 22 y.o.   MRN: [id number]  HPI  Has been seeing pyschaitrist - Trey Paula - at [facility] with Dr. Dub Mikes.   He increased her up to citalopram 60mg . She has not seen him recently due to expense. Does not think her medicaiton needs to be adjusted as she is doing a lot better since they increased her citalopram dose in Oct.  She still has occ anxiety and struggles w/ ADHD - still has trouble concentrated but coping.  Using klonopoin bid prn which just really depends on stress level in life. Does not sleep well but does get 8 hrs regularly - toss and turns all night and legs don't moving.  No improvement when she takes her klonopin at night.  Has tried melatonin which sometimes works but she is just used to it. Living with boyfriend of 3 yrs. Working full time as Manufacturing engineer.    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  Has bads periods - nuvaring and depo did not work from her. Wants to consider an IUD and will make an appt w/ gyn  No prob w/ asthma  Past Medical History  Diagnosis Date  . Depression   . ADHD (attention deficit hyperactivity disorder)   . Anxiety   . PTSD (post-traumatic stress disorder)   . Eczema   . TMJ (dislocation of temporomandibular joint)   . Asthma    Current Outpatient Prescriptions on File Prior to Visit  Medication Sig Dispense Refill  . citalopram (CELEXA) 40 MG tablet Take 60 mg by mouth daily.      . clonazePAM (KLONOPIN) 0.5 MG tablet Take 0.5 mg by mouth 2 (two) times daily as needed. For anxiety      . cyclobenzaprine (FLEXERIL) 10 MG tablet Take 1 tablet (10 mg total) by mouth 2 (two) times daily as needed for muscle spasms.  20 tablet  0  . cyclobenzaprine (FLEXERIL) 10 MG tablet Take 1 tablet (10 mg total) by mouth 2 (two) times daily as needed for muscle spasms.  20 tablet  0  . etonogestrel-ethinyl estradiol (NUVARING)  0.12-0.015 MG/24HR vaginal ring Insert vaginally and leave in place for 3 consecutive weeks, then remove for 1 week.  1 each  12  . fluticasone (FLONASE) 50 MCG/ACT nasal spray Place 2 sprays into the nose daily as needed. For allergies      . oxyCODONE-acetaminophen (PERCOCET) 5-325 MG per tablet Take 1 tablet by mouth every 6 (six) hours as needed for pain.  10 tablet  0  . oxyCODONE-acetaminophen (PERCOCET) 5-325 MG per tablet Take 1 tablet by mouth every 6 (six) hours as needed for pain.  15 tablet  0  . triamcinolone lotion (KENALOG) 0.1 % Apply topically 2 (two) times daily.  60 mL  4   No current facility-administered medications on file prior to visit.   Allergies  Allergen Reactions  . Food Anaphylaxis    Melons: any type in the melon family with cause anaphylaxis   . Peanut-Containing Drug Products Anaphylaxis  . Lactose Intolerance (Gi) Other (See Comments)    Reaction: GI upset  . Vitamin C Itching    Most food which have vitamin c causes itching  . Sulfa Antibiotics Rash    Childhood allergy      Review of Systems  Constitutional: Positive for fatigue. Negative for fever, chills, diaphoresis,  activity change, appetite change and unexpected weight change.  Respiratory: Negative for cough, shortness of breath and wheezing.   Cardiovascular: Negative for chest pain.  Gastrointestinal: Negative for nausea and vomiting.  Genitourinary: Positive for vaginal bleeding and menstrual problem.  Allergic/Immunologic: Positive for food allergies.  Psychiatric/Behavioral: Positive for sleep disturbance and decreased concentration. Negative for behavioral problems, confusion, dysphoric mood and agitation. The patient is nervous/anxious and is hyperactive.       BP 126/75  Pulse 87  Temp(Src) 99.1 F (37.3 C) (Oral)  Resp 16  Ht 5\' 6"  (1.676 m)  Wt 243 lb (110.224 kg)  BMI 39.24 kg/m2  SpO2 99% Objective:   Physical Exam  Constitutional: She is oriented to person, place, and  time. She appears well-developed and well-nourished. No distress.  HENT:  Head: Normocephalic and atraumatic.  Right Ear: External ear normal.  Left Ear: External ear normal.  Eyes: Conjunctivae are normal. No scleral icterus.  Neck: Normal range of motion. Neck supple. No thyromegaly present.  Cardiovascular: Normal rate, regular rhythm, normal heart sounds and intact distal pulses.   Pulmonary/Chest: Effort normal and breath sounds normal. No respiratory distress.  Musculoskeletal: She exhibits no edema.  Lymphadenopathy:    She has no cervical adenopathy.  Neurological: She is alert and oriented to person, place, and time.  Skin: Skin is warm and dry. She is not diaphoretic. No erythema.  Psychiatric: She has a normal mood and affect. Her behavior is normal.      Assessment & Plan:   1. Depression  citalopram (CELEXA) 40 MG tablet   citalopram (CELEXA) 40 MG tablet  2. Anxiety state, unspecified  clonazePAM (KLONOPIN) 0.5 MG tablet   clonazePAM (KLONOPIN) 0.5 MG tablet  3. Anaphylactic reaction due to food  EPINEPHrine (EPIPEN) 0.3 mg/0.3 mL DEVI   EPINEPHrine (EPIPEN) 0.3 mg/0.3 mL DEVI   Meds ordered this encounter  Medications  . citalopram (CELEXA) 40 MG tablet    Sig: Take 1.5 tablets (60 mg total) by mouth daily.    Dispense:  140 tablet    Refill:  1  . clonazePAM (KLONOPIN) 0.5 MG tablet    Sig: Take 1 tablet (0.5 mg total) by mouth 2 (two) times daily as needed. For anxiety    Dispense:  60 tablet    Refill:  3  . EPINEPHrine (EPIPEN) 0.3 mg/0.3 mL DEVI    Sig: Inject 0.3 mLs (0.3 mg total) into the muscle once.    Dispense:  2 Device    Refill:  2   Make appt w/ gyn for well-woman care and discuss IUD. If she needs meds adjusted, f/u w/ psych. If she needs refills, f/u in 6 mos.

## 2012-09-17 ENCOUNTER — Telehealth: Payer: Self-pay

## 2012-09-17 NOTE — Telephone Encounter (Signed)
I had completed a prior auth over the phone for pt's citalopram 40 mg, take 1 1/2 tabs QD on 09/12/12. Received denial of coverage for quantity over recommended dose. I have place denial notice in Dr Alver Fisher box for review.

## 2012-09-18 NOTE — Telephone Encounter (Signed)
Checked [facility] $4 list and Citalopram 40 mg is on their list as well.  Called pt and she stated that she could not really talk to me about her Rx at the time. Pt will call back when she is able to talk.

## 2012-09-18 NOTE — Telephone Encounter (Signed)
Citalopram is on the $4 list at [facility] - a likely [facility] as well so hopefully pt can just pay for it out of pocket - would be much easier than trying to taper her off and onto something else which might not work as well.

## 2012-09-26 NOTE — Telephone Encounter (Signed)
Called her again  Left detailed message

## 2012-11-20 ENCOUNTER — Telehealth: Payer: Self-pay

## 2012-11-20 NOTE — Telephone Encounter (Signed)
[facility] request refill on Klonopin 0.5 mg.

## 2012-11-20 NOTE — Telephone Encounter (Signed)
Pt given a 4 mo supply on 2/9 so shouldn't need refill until 6/9.  Has she been taking more than rx'ed or did she not know she had refills? If so, needs to f/u here or with psych to adjust dose and discuss importance of following directions on med bottle.

## 2012-11-21 NOTE — Telephone Encounter (Signed)
Called pharmacy and was advised that they did not have the RFs in the system. Pt has 2 RFs and they will fill it for her.

## 2013-04-05 ENCOUNTER — Ambulatory Visit (INDEPENDENT_AMBULATORY_CARE_PROVIDER_SITE_OTHER): Payer: 59 | Admitting: Emergency Medicine

## 2013-04-05 VITALS — BP 122/80 | HR 98 | Temp 99.8°F | Resp 16 | Ht 66.25 in | Wt 244.0 lb

## 2013-04-05 DIAGNOSIS — F329 Major depressive disorder, single episode, unspecified: Secondary | ICD-10-CM

## 2013-04-05 DIAGNOSIS — N912 Amenorrhea, unspecified: Secondary | ICD-10-CM

## 2013-04-05 DIAGNOSIS — F411 Generalized anxiety disorder: Secondary | ICD-10-CM

## 2013-04-05 LAB — POCT CBC
Hemoglobin: 12.1 g/dL — AB (ref 12.2–16.2)
MCH, POC: 25.9 pg — AB (ref 27–31.2)
MPV: 9.7 fL (ref 0–99.8)
POC MID %: 6.9 %M (ref 0–12)
RBC: 4.67 M/uL (ref 4.04–5.48)
WBC: 8.3 10*3/uL (ref 4.6–10.2)

## 2013-04-05 LAB — POCT URINE PREGNANCY: Preg Test, Ur: NEGATIVE

## 2013-04-05 MED ORDER — CLONAZEPAM 0.5 MG PO TABS: 0.5000 mg | ORAL_TABLET | Freq: Two times a day (BID) | ORAL | Status: DC | PRN

## 2013-04-05 MED ORDER — CITALOPRAM HYDROBROMIDE 40 MG PO TABS: 40.0000 mg | ORAL_TABLET | Freq: Every day | ORAL | Status: DC
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
## 2013-04-09 LAB — COMPREHENSIVE METABOLIC PANEL
AST: 13 IU/L (ref 0–40)
Albumin/Globulin Ratio: 1.5 (ref 1.1–2.5)
Albumin: 4.1 g/dL (ref 3.5–5.5)
BUN: 8 mg/dL (ref 6–20)
Calcium: 9.7 mg/dL (ref 8.7–10.2)
Creatinine, Ser: 0.71 mg/dL (ref 0.57–1.00)
GFR calc non Af Amer: 120 mL/min/{1.73_m2} (ref >59)
Globulin, Total: 2.8 g/dL (ref 1.5–4.5)
Sodium: 140 mmol/L (ref 134–144)
Total Protein: 6.9 g/dL (ref 6.0–8.5)

## 2013-04-16 ENCOUNTER — Ambulatory Visit (INDEPENDENT_AMBULATORY_CARE_PROVIDER_SITE_OTHER): Payer: 59 | Admitting: Family Medicine

## 2013-04-16 VITALS — BP 128/92 | HR 87 | Temp 98.8°F | Resp 18 | Ht 66.5 in | Wt 241.8 lb

## 2013-04-16 DIAGNOSIS — F32A Depression, unspecified: Secondary | ICD-10-CM

## 2013-04-16 DIAGNOSIS — F329 Major depressive disorder, single episode, unspecified: Secondary | ICD-10-CM

## 2013-04-16 DIAGNOSIS — F411 Generalized anxiety disorder: Secondary | ICD-10-CM

## 2013-04-16 NOTE — Progress Notes (Signed)
22 yo [employer] worker who lives with boyfriend.  She's had two months of insomnia, fatigue, and teary days.  No boyfriend problem.  No new stressors.  Her psychiatrist moved out of town, but she is still seeing a Veterinary surgeon.  She has psychiatric evaluation on Thursday.    She just cannot get motivated to go to work.  Positive F/H depression  Objective:  Crying during the interview. Results for orders placed in visit on 04/05/13  COMPREHENSIVE METABOLIC PANEL      Result Value Range   Glucose 70  65 - 99 mg/dL   BUN 8  6 - 20 mg/dL   Creatinine, Ser 1.61  0.57 - 1.00 mg/dL   GFR calc non Af Amer 120  >59 mL/min/1.73   GFR calc Af Amer 138  >59 mL/min/1.73   BUN/Creatinine Ratio 11  8 - 20   Sodium 140  134 - 144 mmol/L   Potassium 3.9  3.5 - 5.2 mmol/L   Chloride 103  97 - 108 mmol/L   CO2 23  18 - 29 mmol/L   Calcium 9.7  8.7 - 10.2 mg/dL   Total Protein 6.9  6.0 - 8.5 g/dL   Albumin 4.1  3.5 - 5.5 g/dL   Globulin, Total 2.8  1.5 - 4.5 g/dL   Albumin/Globulin Ratio 1.5  1.1 - 2.5   Total Bilirubin 0.2  0.0 - 1.2 mg/dL   Alkaline Phosphatase 76  39 - 117 IU/L   AST 13  0 - 40 IU/L   ALT 13  0 - 32 IU/L  TSH      Result Value Range   TSH 0.592  0.450 - 4.500 uIU/mL  POCT URINE PREGNANCY      Result Value Range   Preg Test, Ur Negative    POCT CBC      Result Value Range   WBC 8.3  4.6 - 10.2 K/uL   Lymph, poc 2.6  0.6 - 3.4   POC LYMPH PERCENT 31.1  10 - 50 %L   MID (cbc) 0.6  0 - 0.9   POC MID % 6.9  0 - 12 %M   POC Granulocyte 5.1  2 - 6.9   Granulocyte percent 62.0  37 - 80 %G   RBC 4.67  4.04 - 5.48 M/uL   Hemoglobin 12.1 (*) 12.2 - 16.2 g/dL   HCT, POC 09.6  04.5 - 47.9 %   MCV 83.6  80 - 97 fL   MCH, POC 25.9 (*) 27 - 31.2 pg   MCHC 31.0 (*) 31.8 - 35.4 g/dL   RDW, POC 40.9     Platelet Count, POC 319  142 - 424 K/uL   MPV 9.7  0 - 99.8 fL   Denies suicidal ideation  Assessment:  Major depression  Plan: psychiatric evaluation on Thursday OOW until  Monday.  Signed,  [name],  MD

## 2013-04-16 NOTE — Patient Instructions (Signed)
Results for orders placed in visit on 04/05/13  COMPREHENSIVE METABOLIC PANEL      Result Value Range   Glucose 70  65 - 99 mg/dL   BUN 8  6 - 20 mg/dL   Creatinine, Ser 1.61  0.57 - 1.00 mg/dL   GFR calc non Af Amer 120  >59 mL/min/1.73   GFR calc Af Amer 138  >59 mL/min/1.73   BUN/Creatinine Ratio 11  8 - 20   Sodium 140  134 - 144 mmol/L   Potassium 3.9  3.5 - 5.2 mmol/L   Chloride 103  97 - 108 mmol/L   CO2 23  18 - 29 mmol/L   Calcium 9.7  8.7 - 10.2 mg/dL   Total Protein 6.9  6.0 - 8.5 g/dL   Albumin 4.1  3.5 - 5.5 g/dL   Globulin, Total 2.8  1.5 - 4.5 g/dL   Albumin/Globulin Ratio 1.5  1.1 - 2.5   Total Bilirubin 0.2  0.0 - 1.2 mg/dL   Alkaline Phosphatase 76  39 - 117 IU/L   AST 13  0 - 40 IU/L   ALT 13  0 - 32 IU/L  TSH      Result Value Range   TSH 0.592  0.450 - 4.500 uIU/mL  POCT URINE PREGNANCY      Result Value Range   Preg Test, Ur Negative    POCT CBC      Result Value Range   WBC 8.3  4.6 - 10.2 K/uL   Lymph, poc 2.6  0.6 - 3.4   POC LYMPH PERCENT 31.1  10 - 50 %L   MID (cbc) 0.6  0 - 0.9   POC MID % 6.9  0 - 12 %M   POC Granulocyte 5.1  2 - 6.9   Granulocyte percent 62.0  37 - 80 %G   RBC 4.67  4.04 - 5.48 M/uL   Hemoglobin 12.1 (*) 12.2 - 16.2 g/dL   HCT, POC 09.6  04.5 - 47.9 %   MCV 83.6  80 - 97 fL   MCH, POC 25.9 (*) 27 - 31.2 pg   MCHC 31.0 (*) 31.8 - 35.4 g/dL   RDW, POC 40.9     Platelet Count, POC 319  142 - 424 K/uL   MPV 9.7  0 - 99.8 fL   Depression, Adult Depression refers to feeling sad, low, down in the dumps, blue, gloomy, or empty. In general, there are two kinds of depression: 1. Depression that we all experience from time to time because of upsetting life experiences, including the loss of a job or the ending of a relationship (normal sadness or normal grief). This kind of depression is considered normal, is short lived, and resolves within a few days to 2 weeks. (Depression experienced after the loss of a loved one is called  bereavement. Bereavement often lasts longer than 2 weeks but normally gets better with time.) 2. Clinical depression, which lasts longer than normal sadness or normal grief or interferes with your ability to function at home, at work, and in school. It also interferes with your personal relationships. It affects almost every aspect of your life. Clinical depression is an illness. Symptoms of depression also can be caused by conditions other than normal sadness and grief or clinical depression. Examples of these conditions are listed as follows:  Physical illness Some physical illnesses, including underactive thyroid gland (hypothyroidism), severe anemia, specific types of cancer, diabetes, uncontrolled seizures, heart and lung problems, strokes,  and chronic pain are commonly associated with symptoms of depression.  Side effects of some prescription medicine In some people, certain types of prescription medicine can cause symptoms of depression.  Substance abuse Abuse of alcohol and illicit drugs can cause symptoms of depression. SYMPTOMS Symptoms of normal sadness and normal grief include the following:  Feeling sad or crying for short periods of time.  Not caring about anything (apathy).  Difficulty sleeping or sleeping too much.  No longer able to enjoy the things you used to enjoy.  Desire to be by oneself all the time (social isolation).  Lack of energy or motivation.  Difficulty concentrating or remembering.  Change in appetite or weight.  Restlessness or agitation. Symptoms of clinical depression include the same symptoms of normal sadness or normal grief and also the following symptoms:  Feeling sad or crying all the time.  Feelings of guilt or worthlessness.  Feelings of hopelessness or helplessness.  Thoughts of suicide or the desire to harm yourself (suicidal ideation).  Loss of touch with reality (psychotic symptoms). Seeing or hearing things that are not real  (hallucinations) or having false beliefs about your life or the people around you (delusions and paranoia). DIAGNOSIS  The diagnosis of clinical depression usually is based on the severity and duration of the symptoms. Your caregiver also will ask you questions about your medical history and substance use to find out if physical illness, use of prescription medicine, or substance abuse is causing your depression. Your caregiver also may order blood tests. TREATMENT  Typically, normal sadness and normal grief do not require treatment. However, sometimes antidepressant medicine is prescribed for bereavement to ease the depressive symptoms until they resolve. The treatment for clinical depression depends on the severity of your symptoms but typically includes antidepressant medicine, counseling with a mental health professional, or a combination of both. Your caregiver will help to determine what treatment is best for you. Depression caused by physical illness usually goes away with appropriate medical treatment of the illness. If prescription medicine is causing depression, talk with your caregiver about stopping the medicine, decreasing the dose, or substituting another medicine. Depression caused by abuse of alcohol or illicit drugs abuse goes away with abstinence from these substances. Some adults need professional help in order to stop drinking or using drugs. SEEK IMMEDIATE CARE IF:  You have thoughts about hurting yourself or others.  You lose touch with reality (have psychotic symptoms).  You are taking medicine for depression and have a serious side effect. FOR MORE INFORMATION National Alliance on Mental Illness: www.nami.[employer]: http://www.maynard.net/ Document Released: 07/15/2000 Document Revised: 01/17/2012 Document Reviewed: 10/17/2011 [employer] Patient Information 2014 [place], [place].

## 2013-04-23 ENCOUNTER — Ambulatory Visit (INDEPENDENT_AMBULATORY_CARE_PROVIDER_SITE_OTHER): Payer: 59 | Admitting: Family Medicine

## 2013-04-23 VITALS — BP 114/72 | HR 112 | Temp 99.3°F | Resp 17 | Ht 66.0 in | Wt 238.0 lb

## 2013-04-23 DIAGNOSIS — F319 Bipolar disorder, unspecified: Secondary | ICD-10-CM

## 2013-04-23 DIAGNOSIS — R5383 Other fatigue: Secondary | ICD-10-CM

## 2013-04-23 DIAGNOSIS — R5381 Other malaise: Secondary | ICD-10-CM

## 2013-04-23 NOTE — Progress Notes (Signed)
22 yo woman with anxiety and depression issues.  Unable to go back to work or even drive.  She was recently put on Geodon 60 mg and she feels tired and "out of it"  all the time now.  Finds that she is more forgetful.  Seeing Dr. Tressie Stalker for bipolar disorder.  Still taking some clonazepam occasionally.  Next appt is October 7th.  She is seeing lights flicker and hearing her name being called.  Objective: NAD No more suicidal thoughts. HEENT:  Unremarkable Chest:  Clear Heart:  Reg, no murmur or gallop Skin:  Clear Patient is crying and quite upset  Assessment:  Overmedicated  Plan:  Discontinue the clonazepam and the citalopram.  Call [facility] and ask to reduce the Geodon. Recheck Saturday.  Signed, [name], MD   Note;  fmla FORMS COMPLETED

## 2013-04-23 NOTE — Patient Instructions (Addendum)
Discontinue clonazepam and citalopram Do not take more than 20 mg of Geodon in any 24 hour period.

## 2013-04-27 ENCOUNTER — Ambulatory Visit (INDEPENDENT_AMBULATORY_CARE_PROVIDER_SITE_OTHER): Payer: 59 | Admitting: Family Medicine

## 2013-04-27 VITALS — BP 130/80 | HR 66 | Temp 99.3°F | Resp 16 | Ht 66.0 in | Wt 234.0 lb

## 2013-04-27 DIAGNOSIS — F32A Depression, unspecified: Secondary | ICD-10-CM

## 2013-04-27 DIAGNOSIS — R5381 Other malaise: Secondary | ICD-10-CM

## 2013-04-27 DIAGNOSIS — F329 Major depressive disorder, single episode, unspecified: Secondary | ICD-10-CM

## 2013-04-27 DIAGNOSIS — IMO0001 Reserved for inherently not codable concepts without codable children: Secondary | ICD-10-CM

## 2013-04-27 LAB — POCT SEDIMENTATION RATE: POCT SED RATE: 45 mm/hr — AB (ref 0–22)

## 2013-04-27 MED ORDER — MELOXICAM 7.5 MG PO TABS: 7.5000 mg | ORAL_TABLET | Freq: Every day | ORAL | Status: DC

## 2013-04-27 NOTE — Progress Notes (Signed)
22 yo [employer] worker with recent overmedication syndrome.  She is still seeing geometric figures and hearing her name being called.  Her hypersomnolence is continuing despite quitting the Geodon 5 days ago.    Diffuse myalgia back and legs.  She couldn't sleep Tuesday night.  She continues on Celexa and depression continues.  Objective:  Much more alert, appropriate.   Assessment:  Much more alert.  Diffuse myalgia.  Plan: recheck Thursday at 12:30 pm.  Stay out of work until cleared by me. Myalgia and myositis - Plan: POCT SEDIMENTATION RATE, Comprehensive metabolic panel, CK, Rheumatoid factor, Follicle Stimulating Hormone, TSH  Other malaise and fatigue - Plan: POCT SEDIMENTATION RATE, Comprehensive metabolic panel, CK, Rheumatoid factor, Follicle Stimulating Hormone, TSH  Depression - Plan: POCT SEDIMENTATION RATE, Comprehensive metabolic panel, CK, Rheumatoid factor, Follicle Stimulating Hormone, TSH  Signed, [name], MD

## 2013-04-27 NOTE — Patient Instructions (Addendum)
12:30 pm Thursday October 2nd at [address]

## 2013-04-28 NOTE — Addendum Note (Signed)
Addended by: Johnnette Litter on: 04/28/2013 11:49 AM   Modules accepted: Orders

## 2013-04-30 LAB — COMPREHENSIVE METABOLIC PANEL
ALT: 16 IU/L (ref 0–32)
AST: 14 IU/L (ref 0–40)
Albumin/Globulin Ratio: 1.4 (ref 1.1–2.5)
Albumin: 4.3 g/dL (ref 3.5–5.5)
Alkaline Phosphatase: 76 IU/L (ref 39–117)
BUN/Creatinine Ratio: 15 (ref 8–20)
BUN: 10 mg/dL (ref 6–20)
CO2: 23 mmol/L (ref 18–29)
Calcium: 9.7 mg/dL (ref 8.7–10.2)
Chloride: 101 mmol/L (ref 97–108)
Creatinine, Ser: 0.67 mg/dL (ref 0.57–1.00)
GFR calc Af Amer: 142 mL/min/{1.73_m2} (ref >59)
GFR calc non Af Amer: 123 mL/min/{1.73_m2} (ref >59)
Globulin, Total: 3 g/dL (ref 1.5–4.5)
Glucose: 86 mg/dL (ref 65–99)
Potassium: 4 mmol/L (ref 3.5–5.2)
Sodium: 140 mmol/L (ref 134–144)
Total Bilirubin: 0.1 mg/dL (ref 0.0–1.2)
Total Protein: 7.3 g/dL (ref 6.0–8.5)

## 2013-04-30 LAB — ANA: Anti Nuclear Antibody(ANA): NEGATIVE

## 2013-04-30 LAB — FOLLICLE STIMULATING HORMONE: FSH: 1 m[IU]/mL

## 2013-04-30 LAB — TSH: TSH: 0.99 u[IU]/mL (ref 0.450–4.500)

## 2013-04-30 LAB — RHEUMATOID FACTOR: Rhuematoid fact SerPl-aCnc: 7.7 IU/mL (ref 0.0–13.9)

## 2013-04-30 LAB — CK: Total CK: 116 U/L (ref 24–173)

## 2013-05-01 NOTE — Progress Notes (Signed)
Overbook appt made for 10/2 at 12:30 as per provider.

## 2013-05-02 ENCOUNTER — Encounter: Payer: Self-pay | Admitting: Family Medicine

## 2013-05-02 ENCOUNTER — Ambulatory Visit (INDEPENDENT_AMBULATORY_CARE_PROVIDER_SITE_OTHER): Payer: 59 | Admitting: Family Medicine

## 2013-05-02 VITALS — BP 128/70 | HR 92 | Temp 97.9°F | Resp 16 | Ht 65.5 in | Wt 240.0 lb

## 2013-05-02 DIAGNOSIS — M797 Fibromyalgia: Secondary | ICD-10-CM

## 2013-05-02 DIAGNOSIS — G4700 Insomnia, unspecified: Secondary | ICD-10-CM

## 2013-05-02 DIAGNOSIS — IMO0001 Reserved for inherently not codable concepts without codable children: Secondary | ICD-10-CM

## 2013-05-02 DIAGNOSIS — Z23 Encounter for immunization: Secondary | ICD-10-CM

## 2013-05-02 MED ORDER — LORAZEPAM 0.5 MG PO TABS: 0.5000 mg | ORAL_TABLET | Freq: Every evening | ORAL | Status: DC | PRN

## 2013-05-02 NOTE — Progress Notes (Signed)
22 year old woman who works at [employer] who has multiple muscle soreness areas there is continued despite discontinuation of highly sedating medication. She's much more alert now and is now problematic.  She has taken the ativan before.  The diffuse myalgias continue in her impairing her activity. Her depression is lifting and she continues on the Celexa.  Objective: Much more alert, cheerful and appropriate HEENT: Unremarkable Neck: Supple no adenopathy or thyromegaly Chest: Clear Heart: Regular no murmur Gait: Stable  Patient sedimentation rate was 45, her ANA and CK and rheumatoid factors were all within normal limits.  Assessment: Patient is to have a fibromyalgia type picture and the elevated sedimentation rate suggests some moderate degree of inflammation in the connective tissues. I recommended that she stay out of work another 2 weeks at which time we will reevaluate and consider getting her back to work. I want her to continue the Celexa and if the depression is not significantly better, we can consider other medicines such as Abilify or Cymbalta.  Plan: Out of work for next 2 weeks. Continue Celexa, discontinue meloxicam. Recheck in 2 weeks.  We may need to consider a sleep apnea study if symptoms do not improve. Need for prophylactic vaccination and inoculation against influenza - Plan: Flu Vaccine QUAD 36+ mos IM  Insomnia - Plan: LORazepam (ATIVAN) 0.5 MG tablet  Fibromyalgia - Plan: LORazepam (ATIVAN) 0.5 MG tablet  Signed, [name], MD

## 2013-05-09 ENCOUNTER — Telehealth: Payer: Self-pay

## 2013-05-09 NOTE — Telephone Encounter (Signed)
[place] Disability paperwork is in Dr. Ellis Parents box to be comleted.

## 2013-05-16 ENCOUNTER — Ambulatory Visit: Payer: 59 | Admitting: Family Medicine

## 2013-05-19 ENCOUNTER — Ambulatory Visit (INDEPENDENT_AMBULATORY_CARE_PROVIDER_SITE_OTHER): Payer: 59 | Admitting: Family Medicine

## 2013-05-19 VITALS — BP 114/78 | HR 100 | Temp 98.7°F | Resp 18 | Ht 65.5 in | Wt 239.0 lb

## 2013-05-19 DIAGNOSIS — R351 Nocturia: Secondary | ICD-10-CM

## 2013-05-19 DIAGNOSIS — L299 Pruritus, unspecified: Secondary | ICD-10-CM

## 2013-05-19 DIAGNOSIS — L659 Nonscarring hair loss, unspecified: Secondary | ICD-10-CM

## 2013-05-19 DIAGNOSIS — E669 Obesity, unspecified: Secondary | ICD-10-CM

## 2013-05-19 DIAGNOSIS — R630 Anorexia: Secondary | ICD-10-CM

## 2013-05-19 DIAGNOSIS — F319 Bipolar disorder, unspecified: Secondary | ICD-10-CM

## 2013-05-19 DIAGNOSIS — R3581 Nocturnal polyuria: Secondary | ICD-10-CM

## 2013-05-19 LAB — POCT URINALYSIS DIPSTICK
Bilirubin, UA: NEGATIVE
Blood, UA: NEGATIVE
Glucose, UA: NEGATIVE
Ketones, UA: 15
Leukocytes, UA: NEGATIVE
Nitrite, UA: NEGATIVE
Protein, UA: NEGATIVE
Spec Grav, UA: >=1.03
Urobilinogen, UA: 0.2
pH, UA: 5.5

## 2013-05-19 LAB — POCT UA - MICROSCOPIC ONLY
Casts, Ur, LPF, POC: NEGATIVE
Crystals, Ur, HPF, POC: NEGATIVE
RBC, urine, microscopic: NEGATIVE
Yeast, UA: NEGATIVE

## 2013-05-19 LAB — POCT CBC
Granulocyte percent: 50.9 %G (ref 37–80)
HCT, POC: 41.8 % (ref 37.7–47.9)
Hemoglobin: 12.8 g/dL (ref 12.2–16.2)
Lymph, poc: 3.8 — AB (ref 0.6–3.4)
MCH, POC: 26 pg — AB (ref 27–31.2)
MCHC: 30.6 g/dL — AB (ref 31.8–35.4)
MCV: 84.7 fL (ref 80–97)
MID (cbc): 0.9 (ref 0–0.9)
MPV: 9.8 fL (ref 0–99.8)
POC Granulocyte: 4.8 (ref 2–6.9)
POC LYMPH PERCENT: 39.6 %L (ref 10–50)
POC MID %: 9.5 %M (ref 0–12)
Platelet Count, POC: 312 10*3/uL (ref 142–424)
RBC: 4.93 M/uL (ref 4.04–5.48)
RDW, POC: 16.5 %
WBC: 9.5 10*3/uL (ref 4.6–10.2)

## 2013-05-19 LAB — GLUCOSE, POCT (MANUAL RESULT ENTRY): POC Glucose: 88 mg/dl (ref 70–99)

## 2013-05-19 LAB — POCT SEDIMENTATION RATE: POCT SED RATE: 41 mm/hr — AB (ref 0–22)

## 2013-05-19 MED ORDER — PREDNISONE 20 MG PO TABS: ORAL_TABLET | ORAL | Status: DC

## 2013-05-19 NOTE — Progress Notes (Signed)
Subjective:  This chart was scribed for Morgan Sidle, MD by Carl Best, Medical Scribe. This patient was seen in Room 4 care was started at 10:24 AM.    Patient ID: Morgan [name], female    DOB: [birth year], 22 y.o.   MRN: [id number]  HPI HPI Comments: Morgan [name] is a 22 y.o. female who presents to [facility] complaining of constant itching, hair loss, an overactive bladder, and palpitations.  She states that she does not think that the itching is allergy-related.  The patient states that she has taken 8 Benadryl for her symptoms with no relief.  The patient denies rhinorrhea and itchy eyes as associated symptoms.  She lists intermittent sore throat and tingling in her arms and feet as a associated symptoms.  She states that she will get up about 4 times a night to use the bathroom.  She states that she is getting more sleep but is still very tired.  She states that she was seen in another doctor's office for her bipolar disorder and was prescribed lituda on Thursday.  She states that the lituda curbs her appetite and causes her to become drowzy. She states that her muscle soreness has not improved.  She states that she has very sensitive skin and allergies.  She denies any changes in her soap or detergent.  She states that she feels hot all the time.  The patient states that her psychiatrist is a Freight forwarder at Dr. Runell Gess office.  She states that she has a family history of DM.  She also states that her sugar will drop.    Past Medical History  Diagnosis Date  . Depression   . ADHD (attention deficit hyperactivity disorder)   . Anxiety   . PTSD (post-traumatic stress disorder)   . Eczema   . TMJ (dislocation of temporomandibular joint)   . Asthma    Past Surgical History  Procedure Laterality Date  . Addenoid    . Foot surgery     History   Social History  . Marital Status: Single    Spouse Name: N/A    Number of Children: N/A  . Years of Education: N/A   Occupational  History  . Not on file.   Social History Main Topics  . Smoking status: Never Smoker   . Smokeless tobacco: Never Used  . Alcohol Use: Yes     Comment: social  . Drug Use: No  . Sexual Activity: Yes    Birth Control/ Protection: Inserts   Other Topics Concern  . Not on file   Social History Narrative  . No narrative on file   Allergies  Allergen Reactions  . Food Anaphylaxis    Melons: any type in the melon family with cause anaphylaxis   . Peanut-Containing Drug Products Anaphylaxis  . Geodon [Ziprasidone Hcl] Other (See Comments)    Hypersomnolence   . Lactose Intolerance (Gi) Other (See Comments)    Reaction: GI upset  . Soy Allergy   . Vitamin C Itching    Most food which have vitamin c causes itching  . Sulfa Antibiotics Rash    Childhood allergy   Current outpatient prescriptions:citalopram (CELEXA) 40 MG tablet, Take 40 mg by mouth daily., Disp: , Rfl: ;  EPINEPHrine (EPIPEN) 0.3 mg/0.3 mL DEVI, Inject 0.3 mLs (0.3 mg total) into the muscle once., Disp: 2 Device, Rfl: 2;  fluticasone (FLONASE) 50 MCG/ACT nasal spray, Place 2 sprays into the nose daily as needed. For allergies, Disp: ,  Rfl:  LORazepam (ATIVAN) 0.5 MG tablet, Take 1 tablet (0.5 mg total) by mouth at bedtime as needed for anxiety., Disp: 30 tablet, Rfl: 1;  Lurasidone HCl (LATUDA) 20 MG TABS, Take 20 mg by mouth daily., Disp: , Rfl: ;  etonogestrel-ethinyl estradiol (NUVARING) 0.12-0.015 MG/24HR vaginal ring, Insert vaginally and leave in place for 3 consecutive weeks, then remove for 1 week., Disp: 1 each, Rfl: 12  Filed Vitals:   05/19/13 0856  BP: 114/78  Pulse: 100  Temp: 98.7 F (37.1 C)  TempSrc: Oral  Resp: 18  Height: 5' 5.5" (1.664 m)  Weight: 239 lb (108.41 kg)  SpO2: 100%     Review of Systems  HENT: Positive for sore throat. Negative for rhinorrhea.   Cardiovascular: Positive for palpitations.  Genitourinary: Positive for frequency.  Skin: Positive for rash.   Psychiatric/Behavioral: Positive for sleep disturbance.       Objective:   Physical Exam  Nursing note and vitals reviewed. Constitutional: She is oriented to person, place, and time. She appears well-developed and well-nourished.  HENT:  Head: Normocephalic and atraumatic.  Eyes: Conjunctivae and EOM are normal. Pupils are equal, round, and reactive to light.  Neck: Normal range of motion. Neck supple.  Cardiovascular: Normal rate, regular rhythm and normal heart sounds.   Pulmonary/Chest: Effort normal and breath sounds normal.  Abdominal: Soft. Bowel sounds are normal.  Musculoskeletal: Normal range of motion.  Neurological: She is alert and oriented to person, place, and time.  Skin: Skin is warm and dry. Rash noted.  Psychiatric: She has a normal mood and affect.   Results for orders placed in visit on 05/19/13  POCT CBC      Result Value Range   WBC 9.5  4.6 - 10.2 K/uL   Lymph, poc 3.8 (*) 0.6 - 3.4   POC LYMPH PERCENT 39.6  10 - 50 %L   MID (cbc) 0.9  0 - 0.9   POC MID % 9.5  0 - 12 %M   POC Granulocyte 4.8  2 - 6.9   Granulocyte percent 50.9  37 - 80 %G   RBC 4.93  4.04 - 5.48 M/uL   Hemoglobin 12.8  12.2 - 16.2 g/dL   HCT, POC 16.1  09.6 - 47.9 %   MCV 84.7  80 - 97 fL   MCH, POC 26.0 (*) 27 - 31.2 pg   MCHC 30.6 (*) 31.8 - 35.4 g/dL   RDW, POC 04.5     Platelet Count, POC 312  142 - 424 K/uL   MPV 9.8  0 - 99.8 fL  POCT UA - MICROSCOPIC ONLY      Result Value Range   WBC, Ur, HPF, POC 0-3     RBC, urine, microscopic neg     Bacteria, U Microscopic trace     Mucus, UA trace     Epithelial cells, urine per micros 6-15     Crystals, Ur, HPF, POC neg     Casts, Ur, LPF, POC neg     Yeast, UA neg    POCT URINALYSIS DIPSTICK      Result Value Range   Color, UA yellow     Clarity, UA clear     Glucose, UA neg     Bilirubin, UA neg     Ketones, UA 15     Spec Grav, UA >=1.030     Blood, UA neg     pH, UA 5.5     Protein, UA neg  Urobilinogen, UA 0.2      Nitrite, UA neg     Leukocytes, UA Negative    GLUCOSE, POCT (MANUAL RESULT ENTRY)      Result Value Range   POC Glucose 88  70 - 99 mg/dl       DIAGNOSTIC STUDIES: Oxygen Saturation is 100% on room air, normal by my interpretation.    COORDINATION OF CARE: 10:31 AM- Discussed obtaining blood work to check the patient's thyroid and a UA to rule out the clinical suspicion of infection.        Assessment & Plan:   Meds ordered this encounter  Medications  . Lurasidone HCl (LATUDA) 20 MG TABS    Sig: Take 20 mg by mouth daily.    I personally performed the services described in this documentation, which was scribed in my presence. The recorded information has been reviewed and is accurate.  Nocturnal polyuria - Plan: POCT CBC, POCT SEDIMENTATION RATE, POCT UA - Microscopic Only, POCT urinalysis dipstick, Comprehensive metabolic panel, TSH, POCT glucose (manual entry), CANCELED: Comprehensive metabolic panel, CANCELED: TSH, CANCELED: Glucose (CBG)  Pruritic condition - Plan: POCT CBC, POCT SEDIMENTATION RATE, POCT UA - Microscopic Only, POCT urinalysis dipstick, Comprehensive metabolic panel, TSH, POCT glucose (manual entry), predniSONE (DELTASONE) 20 MG tablet, CANCELED: Comprehensive metabolic panel, CANCELED: TSH, CANCELED: Glucose (CBG)  Loss of appetite - Plan: Comprehensive metabolic panel, TSH, POCT glucose (manual entry), CANCELED: Glucose (CBG)  Bipolar disorder, unspecified Follow up 8 days Signed, Morgan Sidle, MD

## 2013-05-21 LAB — COMPREHENSIVE METABOLIC PANEL
ALT: 14 IU/L (ref 0–32)
AST: 14 IU/L (ref 0–40)
Albumin/Globulin Ratio: 1.4 (ref 1.1–2.5)
Albumin: 4.3 g/dL (ref 3.5–5.5)
Alkaline Phosphatase: 77 IU/L (ref 39–117)
BUN/Creatinine Ratio: 16 (ref 8–20)
BUN: 11 mg/dL (ref 6–20)
CO2: 27 mmol/L (ref 18–29)
Calcium: 10.2 mg/dL (ref 8.7–10.2)
Chloride: 101 mmol/L (ref 97–108)
Creatinine, Ser: 0.7 mg/dL (ref 0.57–1.00)
GFR calc Af Amer: 140 mL/min/{1.73_m2} (ref >59)
GFR calc non Af Amer: 122 mL/min/{1.73_m2} (ref >59)
Globulin, Total: 3 g/dL (ref 1.5–4.5)
Glucose: 87 mg/dL (ref 65–99)
Potassium: 4.2 mmol/L (ref 3.5–5.2)
Sodium: 140 mmol/L (ref 134–144)
Total Bilirubin: <0.2 mg/dL (ref 0.0–1.2)
Total Protein: 7.3 g/dL (ref 6.0–8.5)

## 2013-05-21 LAB — TSH: TSH: 1.87 u[IU]/mL (ref 0.450–4.500)

## 2013-05-23 ENCOUNTER — Ambulatory Visit: Payer: 59 | Admitting: Family Medicine

## 2013-05-27 ENCOUNTER — Ambulatory Visit (INDEPENDENT_AMBULATORY_CARE_PROVIDER_SITE_OTHER): Payer: 59 | Admitting: Family Medicine

## 2013-05-27 VITALS — BP 126/70 | HR 88 | Temp 98.0°F | Resp 16 | Ht 65.0 in | Wt 239.0 lb

## 2013-05-27 DIAGNOSIS — R5381 Other malaise: Secondary | ICD-10-CM

## 2013-05-27 DIAGNOSIS — R5383 Other fatigue: Secondary | ICD-10-CM

## 2013-05-27 DIAGNOSIS — R0683 Snoring: Secondary | ICD-10-CM

## 2013-05-27 DIAGNOSIS — R0609 Other forms of dyspnea: Secondary | ICD-10-CM

## 2013-05-27 DIAGNOSIS — IMO0001 Reserved for inherently not codable concepts without codable children: Secondary | ICD-10-CM

## 2013-05-27 DIAGNOSIS — M791 Myalgia, unspecified site: Secondary | ICD-10-CM

## 2013-05-27 NOTE — Progress Notes (Signed)
22 yo [employer] worker with recent   22 yo [employer] worker with persisting achiness and tiredness.  She is on FMLA.  She has been out of work for about a month now.  She notes some clicking in knees, R>L .  Also notes some fasciculations in chest.  She notes occasional numbness in hands and feet.  Visual problems have resolved.  Prednisone didn't really help that much.  Patient recently tested positive for allergies to multiple agents and is waiting for approval for allergy shot treatment.  Not sleeping well.  Seeing psychologist.  Objective:  NAD Skin:  Clear Heart:  Reg, no murmur Lungs: clear Extrem:  Some crepitus right  Knee, no rash or edema   Plan:  Ok to extend FMLA two more weeks. Myalgia - Plan: Ambulatory referral to Rheumatology  Fatigue - Plan: Ambulatory referral to Rheumatology, Nocturnal polysomnography (NPSG)  Snoring - Plan: Nocturnal polysomnography (NPSG)     Signed, [name], MD

## 2013-05-29 DIAGNOSIS — Z0271 Encounter for disability determination: Secondary | ICD-10-CM

## 2013-06-10 ENCOUNTER — Other Ambulatory Visit: Payer: Self-pay | Admitting: Radiology

## 2013-06-10 DIAGNOSIS — R5381 Other malaise: Secondary | ICD-10-CM

## 2013-06-10 DIAGNOSIS — R0683 Snoring: Secondary | ICD-10-CM

## 2013-06-11 ENCOUNTER — Ambulatory Visit: Payer: 59

## 2013-06-11 ENCOUNTER — Ambulatory Visit (INDEPENDENT_AMBULATORY_CARE_PROVIDER_SITE_OTHER): Payer: 59 | Admitting: Family Medicine

## 2013-06-11 VITALS — BP 124/76 | HR 108 | Temp 98.5°F | Resp 17 | Ht 66.0 in | Wt 244.0 lb

## 2013-06-11 DIAGNOSIS — R079 Chest pain, unspecified: Secondary | ICD-10-CM

## 2013-06-11 MED ORDER — OMEPRAZOLE 20 MG PO CPDR: 20.0000 mg | DELAYED_RELEASE_CAPSULE | Freq: Every day | ORAL | Status: DC

## 2013-06-11 MED ORDER — CYCLOBENZAPRINE HCL 5 MG PO TABS: 5.0000 mg | ORAL_TABLET | Freq: Every day | ORAL | Status: DC

## 2013-06-11 NOTE — Progress Notes (Signed)
°  Subjective:    Patient ID: Morgan [name], female    DOB: 12-19-1990, 22 y.o.   MRN: [id number]  HPI  Morgan [name] is a 22 y.o. female who presents to office complaining of intermittent chest pain that she has described as something sitting on her chest. She describes the pain as chest wall tightness. She complains of associated shortness of breath with the episodes of chest pain. She states the pain began right after she increased the dose of her Latuda to 40 mg. She is not currently taking the medication. She also has had myalgias, fatigue and body stiffness recently. She has tried Aleve, Ibuprofen and Aspirin without relief. She denies any abdominal pain or decrease in appetite. She has an appointment with her Rheumatologist on 11/17.  She works at [employer], but has not been working for the past week due to the stiffness and fatigue.   Review of Systems  Constitutional: Positive for fatigue. Negative for fever.  HENT: Negative for drooling.   Eyes: Negative for discharge.  Respiratory: Positive for chest tightness and shortness of breath. Negative for cough.   Cardiovascular: Positive for chest pain. Negative for leg swelling.  Gastrointestinal: Negative for vomiting.  Endocrine: Negative for polyuria.  Genitourinary: Negative for hematuria.  Musculoskeletal: Positive for myalgias. Negative for gait problem.  Skin: Negative for rash.  Allergic/Immunologic: Negative for immunocompromised state.  Neurological: Negative for speech difficulty.  Hematological: Negative for adenopathy.  Psychiatric/Behavioral: Negative for confusion.       Objective:   Physical Exam  Nursing note and vitals reviewed. Constitutional: She is oriented to person, place, and time. She appears well-developed and well-nourished.  HENT:  Head: Normocephalic and atraumatic.  Eyes: EOM are normal. Pupils are equal, round, and reactive to light.  Neck: Normal range of motion. Neck supple.  Cardiovascular:  Normal rate, regular rhythm, normal heart sounds and intact distal pulses.   Pulmonary/Chest: Effort normal and breath sounds normal.  Abdominal: Bowel sounds are normal. She exhibits no distension. There is no tenderness.  Musculoskeletal: Normal range of motion. She exhibits no edema and no tenderness.  Neurological: She is alert and oriented to person, place, and time. She has normal strength. No cranial nerve deficit or sensory deficit.  Skin: Skin is warm and dry. No rash noted.  Psychiatric: She has a normal mood and affect.    [facility] reading (PRIMARY) by  Dr. Milus Glazier: CXR normal.        Assessment & Plan:   I personally performed the services described in this documentation, which was scribed in my presence. The recorded information has been reviewed and is accurate.  Chest pain, unspecified - Plan: DG Chest 2 View, cyclobenzaprine (FLEXERIL) 5 MG tablet, omeprazole (PRILOSEC) 20 MG capsule Keep rheumatologist appt.  Signed, [name], MD

## 2013-06-24 ENCOUNTER — Ambulatory Visit (INDEPENDENT_AMBULATORY_CARE_PROVIDER_SITE_OTHER): Payer: 59 | Admitting: Family Medicine

## 2013-06-24 VITALS — BP 122/70 | HR 101 | Temp 98.6°F | Resp 18 | Ht 66.5 in | Wt 244.0 lb

## 2013-06-24 DIAGNOSIS — IMO0001 Reserved for inherently not codable concepts without codable children: Secondary | ICD-10-CM

## 2013-06-24 DIAGNOSIS — M797 Fibromyalgia: Secondary | ICD-10-CM

## 2013-06-24 DIAGNOSIS — M549 Dorsalgia, unspecified: Secondary | ICD-10-CM | POA: Insufficient documentation

## 2013-06-24 MED ORDER — DULOXETINE HCL 30 MG PO CPEP: ORAL_CAPSULE | ORAL | Status: DC

## 2013-06-24 NOTE — Patient Instructions (Signed)

## 2013-06-24 NOTE — Progress Notes (Signed)
Morgan [name] is a 22 y.o. female who presents to office complaining of intermittent chest pain that she has described as something sitting on her chest. She describes the pain as chest wall tightness. She complains of associated shortness of breath with the episodes of chest pain. She states the pain began right after she increased the dose of her Latuda to 40 mg. She is not currently taking the medication. She also has had myalgias, fatigue and body stiffness recently. She has tried Aleve, Ibuprofen and Aspirin without relief. She denies any abdominal pain or decrease in appetite. She had an appointment with her Rheumatologist on 11/17.

## 2013-06-24 NOTE — Progress Notes (Signed)
Subjective:    Patient ID: Morgan [name], female    DOB: April 04, 1991, 22 y.o.   MRN: [id number] This chart was scribed for [name], MD by Valera Castle, ED Scribe. This patient was seen in room 9 and the patient's care was started at 9:35 AM.  HPI Morgan [name] is a 22 y.o. female  Per 06/11/2013 visit with Dr. Milus Glazier: Morgan [name] is a 22 y.o. female who presents to office complaining of intermittent chest pain that she has described as something sitting on her chest. She describes the pain as chest wall tightness. She complains of associated shortness of breath with the episodes of chest pain. She states the pain began right after she increased the dose of her Latuda to 40 mg. She is not currently taking the medication. She also has had myalgias, fatigue and body stiffness recently. She has tried Aleve, Ibuprofen and Aspirin without relief. She denies any abdominal pain or decrease in appetite. She had an appointment with her Rheumatologist on 11/17.  Current visit 06/24/2013: She reports visiting the Rheumatologist and being told she may have fibromyalgia. She states that the Rheumatologist informed her she was going to have to live with the pain. She is requesting some kind of pain medication to deal with the pain.   She reports her h/o depression flares up every now and then, and states she is wanting a different depression aid. She reports she has been on the anti-depressant for 8 years, and thinks she has not been responding to it for a some time now.  She is attempting to get back to work. She states she works at [employer] job, and is aiming to get back on Friday, 11/28. She reports working 40 hours a week. She requests a note to be able to return to work.   She is agreeable to f/u next week.   PCP [name], MD  Patient Active Problem List   Diagnosis Date Noted   ADHD (attention deficit hyperactivity disorder) 11/03/2011   Depression 11/03/2011   Past  Medical History  Diagnosis Date   Depression    ADHD (attention deficit hyperactivity disorder)    Anxiety    PTSD (post-traumatic stress disorder)    Eczema    TMJ (dislocation of temporomandibular joint)    Asthma    Past Surgical History  Procedure Laterality Date   Addenoid     Foot surgery     Allergies  Allergen Reactions   Food Anaphylaxis    Melons: any type in the melon family with cause anaphylaxis    Peanut-Containing Drug Products Anaphylaxis   Geodon [Ziprasidone Hcl] Other (See Comments)    Hypersomnolence    Lactose Intolerance (Gi) Other (See Comments)    Reaction: GI upset   Soy Allergy    Vitamin C Itching    Most food which have vitamin c causes itching   Sulfa Antibiotics Rash    Childhood allergy   Prior to Admission medications   Medication Sig Start Date End Date Taking? Authorizing Provider  citalopram (CELEXA) 40 MG tablet Take 40 mg by mouth daily.   Yes Historical Provider, MD  cyclobenzaprine (FLEXERIL) 5 MG tablet Take 1 tablet (5 mg total) by mouth at bedtime. 06/11/13  Yes [name], MD  EPINEPHrine (EPIPEN) 0.3 mg/0.3 mL DEVI Inject 0.3 mLs (0.3 mg total) into the muscle once. 09/09/12  Yes Sherren Mocha, MD  fluticasone (FLONASE) 50 MCG/ACT nasal spray Place 2 sprays into  the nose daily as needed. For allergies   Yes Historical Provider, MD  LORazepam (ATIVAN) 0.5 MG tablet Take 1 tablet (0.5 mg total) by mouth at bedtime as needed for anxiety. 05/02/13  Yes [name], MD  omeprazole (PRILOSEC) 20 MG capsule Take 1 capsule (20 mg total) by mouth daily. 06/11/13  Yes [name], MD  etonogestrel-ethinyl estradiol (NUVARING) 0.12-0.015 MG/24HR vaginal ring Insert vaginally and leave in place for 3 consecutive weeks, then remove for 1 week. 05/20/12   Gwenlyn Found Copland, MD  Lurasidone HCl (LATUDA) 20 MG TABS Take 20 mg by mouth daily.    Historical Provider, MD   Results for orders placed in visit on 05/19/13    COMPREHENSIVE METABOLIC PANEL      Result Value Range   Glucose 87  65 - 99 mg/dL   BUN 11  6 - 20 mg/dL   Creatinine, Ser 1.61  0.57 - 1.00 mg/dL   GFR calc non Af Amer 122  >59 mL/min/1.73   GFR calc Af Amer 140  >59 mL/min/1.73   BUN/Creatinine Ratio 16  8 - 20   Sodium 140  134 - 144 mmol/L   Potassium 4.2  3.5 - 5.2 mmol/L   Chloride 101  97 - 108 mmol/L   CO2 27  18 - 29 mmol/L   Calcium 10.2  8.7 - 10.2 mg/dL   Total Protein 7.3  6.0 - 8.5 g/dL   Albumin 4.3  3.5 - 5.5 g/dL   Globulin, Total 3.0  1.5 - 4.5 g/dL   Albumin/Globulin Ratio 1.4  1.1 - 2.5   Total Bilirubin <0.2  0.0 - 1.2 mg/dL   Alkaline Phosphatase 77  39 - 117 IU/L   AST 14  0 - 40 IU/L   ALT 14  0 - 32 IU/L  TSH      Result Value Range   TSH 1.870  0.450 - 4.500 uIU/mL  POCT CBC      Result Value Range   WBC 9.5  4.6 - 10.2 K/uL   Lymph, poc 3.8 (*) 0.6 - 3.4   POC LYMPH PERCENT 39.6  10 - 50 %L   MID (cbc) 0.9  0 - 0.9   POC MID % 9.5  0 - 12 %M   POC Granulocyte 4.8  2 - 6.9   Granulocyte percent 50.9  37 - 80 %G   RBC 4.93  4.04 - 5.48 M/uL   Hemoglobin 12.8  12.2 - 16.2 g/dL   HCT, POC 09.6  04.5 - 47.9 %   MCV 84.7  80 - 97 fL   MCH, POC 26.0 (*) 27 - 31.2 pg   MCHC 30.6 (*) 31.8 - 35.4 g/dL   RDW, POC 40.9     Platelet Count, POC 312  142 - 424 K/uL   MPV 9.8  0 - 99.8 fL  POCT SEDIMENTATION RATE      Result Value Range   POCT SED RATE 41 (*) 0 - 22 mm/hr  POCT UA - MICROSCOPIC ONLY      Result Value Range   WBC, Ur, HPF, POC 0-3     RBC, urine, microscopic neg     Bacteria, U Microscopic trace     Mucus, UA trace     Epithelial cells, urine per micros 6-15     Crystals, Ur, HPF, POC neg     Casts, Ur, LPF, POC neg     Yeast, UA neg    POCT URINALYSIS  DIPSTICK      Result Value Range   Color, UA yellow     Clarity, UA clear     Glucose, UA neg     Bilirubin, UA neg     Ketones, UA 15     Spec Grav, UA >=1.030     Blood, UA neg     pH, UA 5.5     Protein, UA neg      Urobilinogen, UA 0.2     Nitrite, UA neg     Leukocytes, UA Negative    GLUCOSE, POCT (MANUAL RESULT ENTRY)      Result Value Range   POC Glucose 88  70 - 99 mg/dl   Review of Systems  Constitutional: Positive for fatigue. Negative for appetite change.  Respiratory: Positive for chest tightness and shortness of breath.   Cardiovascular: Positive for chest pain.  Gastrointestinal: Negative for abdominal pain.  Musculoskeletal: Positive for myalgias (generalized).  Psychiatric/Behavioral: Negative for dysphoric mood.      Objective:   Physical Exam  Nursing note and vitals reviewed. Constitutional: She is oriented to person, place, and time. She appears well-developed and well-nourished. No distress.  HENT:  Head: Normocephalic and atraumatic.  Eyes: EOM are normal.  Neck: Neck supple. No tracheal deviation present.  Cardiovascular: Normal rate.   Pulmonary/Chest: Effort normal. No respiratory distress.  Musculoskeletal: Normal range of motion.  Neurological: She is alert and oriented to person, place, and time.  Skin: Skin is warm and dry.  Psychiatric: She has a normal mood and affect. Her behavior is normal.    BP 122/70   Pulse 101   Temp(Src) 98.6 F (37 C) (Oral)   Resp 18   Ht 5' 6.5" (1.689 m)   Wt 244 lb (110.678 kg)   BMI 38.80 kg/m2   SpO2 98%   LMP 06/02/2013    Assessment & Plan:   Fibromyalgia - Plan: DULoxetine (CYMBALTA) 30 MG capsule  Back pain  Recheck one week See work note    I personally performed the services described in this documentation, which was scribed in my presence. The recorded information has been reviewed and is accurate.

## 2013-08-08 ENCOUNTER — Encounter (HOSPITAL_COMMUNITY): Payer: Self-pay | Admitting: Emergency Medicine

## 2013-08-08 ENCOUNTER — Emergency Department (HOSPITAL_COMMUNITY)
Admission: EM | Admit: 2013-08-08 | Discharge: 2013-08-08 | Disposition: A | Discharge status: Other Acute Inpatient Hospital | Payer: 59 | Attending: Emergency Medicine | Admitting: Emergency Medicine

## 2013-08-08 ENCOUNTER — Encounter (HOSPITAL_COMMUNITY): Payer: Self-pay

## 2013-08-08 ENCOUNTER — Inpatient Hospital Stay (HOSPITAL_COMMUNITY)
Admission: AD | Admit: 2013-08-08 | Discharge: 2013-08-12 | DRG: 885 | Disposition: A | Discharge status: Home / Self Care | Payer: 59 | Source: Intra-hospital | Attending: Psychiatry | Admitting: Psychiatry

## 2013-08-08 DIAGNOSIS — J45909 Unspecified asthma, uncomplicated: Secondary | ICD-10-CM | POA: Insufficient documentation

## 2013-08-08 DIAGNOSIS — R450 Nervousness: Secondary | ICD-10-CM | POA: Insufficient documentation

## 2013-08-08 DIAGNOSIS — F909 Attention-deficit hyperactivity disorder, unspecified type: Secondary | ICD-10-CM | POA: Diagnosis present

## 2013-08-08 DIAGNOSIS — F319 Bipolar disorder, unspecified: Secondary | ICD-10-CM | POA: Diagnosis present

## 2013-08-08 DIAGNOSIS — T43502A Poisoning by unspecified antipsychotics and neuroleptics, intentional self-harm, initial encounter: Secondary | ICD-10-CM | POA: Insufficient documentation

## 2013-08-08 DIAGNOSIS — IMO0002 Reserved for concepts with insufficient information to code with codable children: Secondary | ICD-10-CM | POA: Insufficient documentation

## 2013-08-08 DIAGNOSIS — T438X2A Poisoning by other psychotropic drugs, intentional self-harm, initial encounter: Secondary | ICD-10-CM

## 2013-08-08 DIAGNOSIS — L259 Unspecified contact dermatitis, unspecified cause: Secondary | ICD-10-CM | POA: Diagnosis present

## 2013-08-08 DIAGNOSIS — F410 Panic disorder [episodic paroxysmal anxiety] without agoraphobia: Secondary | ICD-10-CM | POA: Diagnosis present

## 2013-08-08 DIAGNOSIS — F332 Major depressive disorder, recurrent severe without psychotic features: Secondary | ICD-10-CM | POA: Diagnosis present

## 2013-08-08 DIAGNOSIS — F32A Depression, unspecified: Secondary | ICD-10-CM

## 2013-08-08 DIAGNOSIS — R45851 Suicidal ideations: Secondary | ICD-10-CM

## 2013-08-08 DIAGNOSIS — F314 Bipolar disorder, current episode depressed, severe, without psychotic features: Principal | ICD-10-CM | POA: Diagnosis present

## 2013-08-08 DIAGNOSIS — F4310 Post-traumatic stress disorder, unspecified: Secondary | ICD-10-CM | POA: Diagnosis present

## 2013-08-08 DIAGNOSIS — T48204A Poisoning by unspecified drugs acting on muscles, undetermined, initial encounter: Secondary | ICD-10-CM | POA: Insufficient documentation

## 2013-08-08 DIAGNOSIS — F411 Generalized anxiety disorder: Secondary | ICD-10-CM | POA: Insufficient documentation

## 2013-08-08 DIAGNOSIS — M549 Dorsalgia, unspecified: Secondary | ICD-10-CM

## 2013-08-08 DIAGNOSIS — Z833 Family history of diabetes mellitus: Secondary | ICD-10-CM

## 2013-08-08 DIAGNOSIS — F3289 Other specified depressive episodes: Secondary | ICD-10-CM | POA: Insufficient documentation

## 2013-08-08 DIAGNOSIS — Z8719 Personal history of other diseases of the digestive system: Secondary | ICD-10-CM | POA: Insufficient documentation

## 2013-08-08 DIAGNOSIS — F329 Major depressive disorder, single episode, unspecified: Secondary | ICD-10-CM | POA: Insufficient documentation

## 2013-08-08 DIAGNOSIS — R000 Tachycardia, unspecified: Secondary | ICD-10-CM | POA: Insufficient documentation

## 2013-08-08 DIAGNOSIS — Z8249 Family history of ischemic heart disease and other diseases of the circulatory system: Secondary | ICD-10-CM

## 2013-08-08 DIAGNOSIS — T50992A Poisoning by other drugs, medicaments and biological substances, intentional self-harm, initial encounter: Secondary | ICD-10-CM | POA: Insufficient documentation

## 2013-08-08 DIAGNOSIS — Z79899 Other long term (current) drug therapy: Secondary | ICD-10-CM | POA: Insufficient documentation

## 2013-08-08 DIAGNOSIS — Z3202 Encounter for pregnancy test, result negative: Secondary | ICD-10-CM | POA: Insufficient documentation

## 2013-08-08 DIAGNOSIS — G4700 Insomnia, unspecified: Secondary | ICD-10-CM | POA: Diagnosis present

## 2013-08-08 DIAGNOSIS — G4710 Hypersomnia, unspecified: Secondary | ICD-10-CM | POA: Diagnosis present

## 2013-08-08 DIAGNOSIS — K219 Gastro-esophageal reflux disease without esophagitis: Secondary | ICD-10-CM | POA: Diagnosis present

## 2013-08-08 DIAGNOSIS — Z87891 Personal history of nicotine dependence: Secondary | ICD-10-CM

## 2013-08-08 DIAGNOSIS — T424X4A Poisoning by benzodiazepines, undetermined, initial encounter: Secondary | ICD-10-CM | POA: Insufficient documentation

## 2013-08-08 DIAGNOSIS — T50902A Poisoning by unspecified drugs, medicaments and biological substances, intentional self-harm, initial encounter: Secondary | ICD-10-CM

## 2013-08-08 DIAGNOSIS — Z872 Personal history of diseases of the skin and subcutaneous tissue: Secondary | ICD-10-CM | POA: Insufficient documentation

## 2013-08-08 LAB — COMPREHENSIVE METABOLIC PANEL
ALT: 22 U/L (ref 0–35)
AST: 19 U/L (ref 0–37)
Albumin: 3.9 g/dL (ref 3.5–5.2)
Alkaline Phosphatase: 85 U/L (ref 39–117)
BUN: 8 mg/dL (ref 6–23)
CALCIUM: 9.7 mg/dL (ref 8.4–10.5)
CO2: 25 meq/L (ref 19–32)
CREATININE: 0.65 mg/dL (ref 0.50–1.10)
Chloride: 100 mEq/L (ref 96–112)
GFR calc Af Amer: >90 mL/min (ref >90)
GLUCOSE: 86 mg/dL (ref 70–99)
Potassium: 3.5 mEq/L — ABNORMAL LOW (ref 3.7–5.3)
SODIUM: 139 meq/L (ref 137–147)
TOTAL PROTEIN: 8.3 g/dL (ref 6.0–8.3)
Total Bilirubin: <0.2 mg/dL — ABNORMAL LOW (ref 0.3–1.2)

## 2013-08-08 LAB — POCT PREGNANCY, URINE: Preg Test, Ur: NEGATIVE

## 2013-08-08 LAB — CBC
HCT: 38.7 % (ref 36.0–46.0)
HEMOGLOBIN: 12.9 g/dL (ref 12.0–15.0)
MCH: 27.1 pg (ref 26.0–34.0)
MCHC: 33.3 g/dL (ref 30.0–36.0)
MCV: 81.3 fL (ref 78.0–100.0)
Platelets: 356 10*3/uL (ref 150–400)
RBC: 4.76 MIL/uL (ref 3.87–5.11)
RDW: 14.9 % (ref 11.5–15.5)
WBC: 10.9 10*3/uL — ABNORMAL HIGH (ref 4.0–10.5)

## 2013-08-08 LAB — RAPID URINE DRUG SCREEN, HOSP PERFORMED
AMPHETAMINES: NOT DETECTED
BARBITURATES: NOT DETECTED
BENZODIAZEPINES: NOT DETECTED
COCAINE: NOT DETECTED
Opiates: NOT DETECTED
TETRAHYDROCANNABINOL: NOT DETECTED

## 2013-08-08 LAB — SALICYLATE LEVEL: Salicylate Lvl: <2 mg/dL — ABNORMAL LOW (ref 2.8–20.0)

## 2013-08-08 LAB — GLUCOSE, CAPILLARY
GLUCOSE-CAPILLARY: 104 mg/dL — AB (ref 70–99)
Glucose-Capillary: 83 mg/dL (ref 70–99)

## 2013-08-08 LAB — ACETAMINOPHEN LEVEL
Acetaminophen (Tylenol), Serum: <15 ug/mL (ref 10–30)
Acetaminophen (Tylenol), Serum: <15 ug/mL (ref 10–30)

## 2013-08-08 LAB — ETHANOL: Alcohol, Ethyl (B): <11 mg/dL (ref 0–11)

## 2013-08-08 MED ORDER — PANTOPRAZOLE SODIUM 40 MG PO TBEC: 40.0000 mg | DELAYED_RELEASE_TABLET | Freq: Every day | ORAL | Status: DC

## 2013-08-08 MED ORDER — ACETAMINOPHEN 325 MG PO TABS: 650.0000 mg | ORAL_TABLET | ORAL | Status: DC | PRN

## 2013-08-08 MED ORDER — NICOTINE 21 MG/24HR TD PT24
21.0000 mg | MEDICATED_PATCH | Freq: Every day | TRANSDERMAL | Status: DC
  Administered 2013-08-08: 21 mg via TRANSDERMAL
  Filled 2013-08-08: qty 1

## 2013-08-08 MED ORDER — IBUPROFEN 200 MG PO TABS: 600.0000 mg | ORAL_TABLET | Freq: Three times a day (TID) | ORAL | Status: DC | PRN

## 2013-08-08 MED ORDER — ZOLPIDEM TARTRATE 5 MG PO TABS: 5.0000 mg | ORAL_TABLET | Freq: Every evening | ORAL | Status: DC | PRN

## 2013-08-08 MED ORDER — ONDANSETRON HCL 4 MG PO TABS: 4.0000 mg | ORAL_TABLET | Freq: Three times a day (TID) | ORAL | Status: DC | PRN

## 2013-08-08 MED ORDER — MONTELUKAST SODIUM 10 MG PO TABS: 10.0000 mg | ORAL_TABLET | Freq: Every day | ORAL | Status: DC

## 2013-08-08 MED ORDER — HYDROXYZINE HCL 25 MG PO TABS: 25.0000 mg | ORAL_TABLET | Freq: Three times a day (TID) | ORAL | Status: DC | PRN

## 2013-08-08 MED ORDER — DULOXETINE HCL 30 MG PO CPEP: 30.0000 mg | ORAL_CAPSULE | Freq: Every day | ORAL | Status: DC

## 2013-08-08 MED ORDER — LORAZEPAM 0.5 MG PO TABS: 0.5000 mg | ORAL_TABLET | Freq: Every evening | ORAL | Status: DC | PRN

## 2013-08-08 NOTE — ED Notes (Signed)
Bed: WA11 Expected date:  Expected time:  Means of arrival:  Comments: EMS-OD? 

## 2013-08-08 NOTE — ED Provider Notes (Signed)
CSN: [id number]     Arrival date & time 08/08/13  1110 History   First MD Initiated Contact with Patient 08/08/13 1116     Chief Complaint  Patient presents with  . Suicidal  . Ingestion   (Consider location/radiation/quality/duration/timing/severity/associated sxs/prior Treatment) HPI Morgan [name] is a 23 y.o. female who presents to emergency department with complaint of overdose and suicidal ideations. Patient states that she has had a "incident with my boyfriend" and states she got upset with him and this morning took 25 of a 0.5 mg lorazepam since 7 of 10 mg cyclobenzaprine this. Patient states that she took these medications because "I do not want to live anymore." Patient states she has been battling depression for the last 6 years. She reports prior incidence of suicidal ideations. Patient denies any physical complaints. She states she's currently on Cymbalta and reports that she is taking it. She denies any homicidal ideations. She denies any other complaints.  Past Medical History  Diagnosis Date  . Depression   . ADHD (attention deficit hyperactivity disorder)   . Anxiety   . PTSD (post-traumatic stress disorder)   . Eczema   . TMJ (dislocation of temporomandibular joint)   . Asthma    Past Surgical History  Procedure Laterality Date  . Addenoid    . Foot surgery     Family History  Problem Relation Age of Onset  . Depression Mother   . Hypertension Mother   . Hypertension Maternal Grandmother   . Hypertension Maternal Grandfather   . Diabetes Maternal Grandfather   . Hypertension Paternal Grandmother   . Diabetes Paternal Grandmother   . Heart murmur Mother    History  Substance Use Topics  . Smoking status: Never Smoker   . Smokeless tobacco: Never Used  . Alcohol Use: Yes     Comment: social   OB History   Grav Para Term Preterm Abortions TAB SAB Ect Mult Living                 Review of Systems  Constitutional: Negative for fever and chills.   Respiratory: Negative for cough, chest tightness and shortness of breath.   Cardiovascular: Negative for chest pain, palpitations and leg swelling.  Gastrointestinal: Negative for nausea, vomiting, abdominal pain and diarrhea.  Genitourinary: Negative for dysuria, flank pain, vaginal bleeding, vaginal discharge, vaginal pain and pelvic pain.  Musculoskeletal: Negative for arthralgias, myalgias, neck pain and neck stiffness.  Skin: Negative for rash.  Neurological: Negative for dizziness, weakness and headaches.  Psychiatric/Behavioral: Positive for suicidal ideas and self-injury. The patient is nervous/anxious.   All other systems reviewed and are negative.    Allergies  Food; Peanut-containing drug products; Geodon; Lactose intolerance (gi); Soy allergy; Vitamin c; and Sulfa antibiotics  Home Medications   Current Outpatient Rx  Name  Route  Sig  Dispense  Refill  . cyclobenzaprine (FLEXERIL) 5 MG tablet   Oral   Take 1 tablet (5 mg total) by mouth at bedtime.   30 tablet   1   . LORazepam (ATIVAN) 0.5 MG tablet   Oral   Take 1 tablet (0.5 mg total) by mouth at bedtime as needed for anxiety.   30 tablet   1   . citalopram (CELEXA) 40 MG tablet   Oral   Take 40 mg by mouth daily.         . DULoxetine (CYMBALTA) 30 MG capsule      One daily for a week, then two daily  60 capsule   3   . EPINEPHrine (EPIPEN) 0.3 mg/0.3 mL DEVI   Intramuscular   Inject 0.3 mLs (0.3 mg total) into the muscle once.   2 Device   2   . etonogestrel-ethinyl estradiol (NUVARING) 0.12-0.015 MG/24HR vaginal ring      Insert vaginally and leave in place for 3 consecutive weeks, then remove for 1 week.   1 each   12   . fluticasone (FLONASE) 50 MCG/ACT nasal spray   Nasal   Place 2 sprays into the nose daily as needed. For allergies         . Lurasidone HCl (LATUDA) 20 MG TABS   Oral   Take 20 mg by mouth daily.         Marland Kitchen. omeprazole (PRILOSEC) 20 MG capsule   Oral   Take 1  capsule (20 mg total) by mouth daily.   30 capsule   3    BP 136/90  Pulse 110  Temp(Src) 99.1 F (37.3 C) (Oral)  Resp 20  SpO2 100% Physical Exam  Nursing note and vitals reviewed. Constitutional: She appears well-developed and well-nourished. No distress.  Appears drwosy  HENT:  Head: Normocephalic.  Eyes: Conjunctivae are normal.  Neck: Neck supple.  Cardiovascular: Regular rhythm and normal heart sounds.  Tachycardia present.   Pulmonary/Chest: Effort normal and breath sounds normal. No respiratory distress. She has no wheezes. She has no rales.  Abdominal: Soft. Bowel sounds are normal. She exhibits no distension. There is no tenderness. There is no rebound.  Musculoskeletal: She exhibits no edema.  Neurological: She is alert.  Skin: Skin is warm and dry.  Psychiatric:  Flat affect. Avoiding eye contact    ED Course  Procedures (including critical care time) Labs Review Labs Reviewed  CBC - Abnormal; Notable for the following:    WBC 10.9 (*)    All other components within normal limits  COMPREHENSIVE METABOLIC PANEL - Abnormal; Notable for the following:    Potassium 3.5 (*)    Total Bilirubin <0.2 (*)    All other components within normal limits  SALICYLATE LEVEL - Abnormal; Notable for the following:    Salicylate Lvl <2.0 (*)    All other components within normal limits  ETHANOL  ACETAMINOPHEN LEVEL  URINE RAPID DRUG SCREEN (HOSP PERFORMED)  GLUCOSE, CAPILLARY  ACETAMINOPHEN LEVEL  POCT PREGNANCY, URINE   Imaging Review No results found.  EKG Interpretation   None       MDM   1. Suicidal overdose, initial encounter      12:02 PM  Discussed with pharmacy. Advised acetaminophen level at 4 hrs, obs on the monitor for 4-6. If continues to be drowsy or appropriate, can be moved to psych.   3:57 PM Pt's 4 hr tylenol negative. Pt monitored for 5 hrs. Now medically cleared.   Filed Vitals:   08/08/13 1110 08/08/13 1516  BP: 136/90 139/79   Pulse: 110 100  Temp: 99.1 F (37.3 C) 98.9 F (37.2 C)  TempSrc: Oral Oral  Resp: 20 20  SpO2: 100% 100%     Myriam Jacobsonatyana A Amare Bail, PA-C 08/08/13 1559

## 2013-08-08 NOTE — ED Notes (Signed)
Pt. Received very sleepy but rousable, however difficult to keep awake.  Unable to fully assess at this time due to patient's ability to stay awake.  Pt. Does report intermittent SI, but contracts for safety.

## 2013-08-08 NOTE — ED Notes (Signed)
[facility]

## 2013-08-08 NOTE — ED Notes (Signed)
Pt endorses SI but agrees to contract.
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
## 2013-08-08 NOTE — Progress Notes (Signed)
Patient has been referred to [facility].  [facility] will seek placement at other hospitals.   

## 2013-08-08 NOTE — ED Notes (Addendum)
Pt arrived via EMS pt took 25 lorazepam and 7 cyclobenzaprine.Pt took these medications around 10am 08/08/2013.  Pt sts that she was SI and "did not want to live anymore". Pt had a recent argument with her boyfriend. Pt was ambulatory to ED. No distress noted.

## 2013-08-09 ENCOUNTER — Encounter (HOSPITAL_COMMUNITY): Payer: Self-pay | Admitting: Psychiatry

## 2013-08-09 DIAGNOSIS — F332 Major depressive disorder, recurrent severe without psychotic features: Secondary | ICD-10-CM

## 2013-08-09 DIAGNOSIS — F319 Bipolar disorder, unspecified: Secondary | ICD-10-CM | POA: Diagnosis present

## 2013-08-09 DIAGNOSIS — F4310 Post-traumatic stress disorder, unspecified: Secondary | ICD-10-CM | POA: Diagnosis present

## 2013-08-09 MED ORDER — NICOTINE 21 MG/24HR TD PT24
21.0000 mg | MEDICATED_PATCH | Freq: Every day | TRANSDERMAL | Status: DC
  Administered 2013-08-09 – 2013-08-11 (×2): 21 mg via TRANSDERMAL
  Filled 2013-08-09 (×6): qty 1

## 2013-08-09 MED ORDER — ALUM & MAG HYDROXIDE-SIMETH 200-200-20 MG/5ML PO SUSP: 30.0000 mL | ORAL | Status: DC | PRN

## 2013-08-09 MED ORDER — LITHIUM CARBONATE ER 300 MG PO TBCR
300.0000 mg | EXTENDED_RELEASE_TABLET | Freq: Two times a day (BID) | ORAL | Status: DC
  Administered 2013-08-09 – 2013-08-12 (×7): 300 mg via ORAL
  Filled 2013-08-09 (×5): qty 1
  Filled 2013-08-09: qty 28
  Filled 2013-08-09 (×3): qty 1
  Filled 2013-08-09: qty 28
  Filled 2013-08-09: qty 1

## 2013-08-09 MED ORDER — DULOXETINE HCL 30 MG PO CPEP
30.0000 mg | ORAL_CAPSULE | Freq: Every day | ORAL | Status: DC
  Administered 2013-08-09 – 2013-08-12 (×4): 30 mg via ORAL
  Filled 2013-08-09 (×2): qty 1
  Filled 2013-08-09: qty 14
  Filled 2013-08-09 (×3): qty 1

## 2013-08-09 MED ORDER — ACETAMINOPHEN 325 MG PO TABS
650.0000 mg | ORAL_TABLET | Freq: Four times a day (QID) | ORAL | Status: DC | PRN
Start: 2013-08-09 — End: 2013-08-12

## 2013-08-09 MED ORDER — MONTELUKAST SODIUM 10 MG PO TABS
10.0000 mg | ORAL_TABLET | Freq: Every day | ORAL | Status: DC
  Administered 2013-08-09 – 2013-08-11 (×2): 10 mg via ORAL
  Filled 2013-08-09 (×7): qty 1

## 2013-08-09 MED ORDER — PANTOPRAZOLE SODIUM 40 MG PO TBEC
40.0000 mg | DELAYED_RELEASE_TABLET | Freq: Every day | ORAL | Status: DC
  Administered 2013-08-09 – 2013-08-12 (×4): 40 mg via ORAL
  Filled 2013-08-09 (×6): qty 1

## 2013-08-09 MED ORDER — ONDANSETRON 4 MG PO TBDP
4.0000 mg | ORAL_TABLET | Freq: Three times a day (TID) | ORAL | Status: DC | PRN
  Filled 2013-08-09: qty 1

## 2013-08-09 MED ORDER — HYDROXYZINE HCL 25 MG PO TABS
25.0000 mg | ORAL_TABLET | Freq: Four times a day (QID) | ORAL | Status: DC | PRN
  Administered 2013-08-10: 25 mg via ORAL
  Filled 2013-08-09: qty 20
  Filled 2013-08-09: qty 1

## 2013-08-09 MED ORDER — ZOLPIDEM TARTRATE 10 MG PO TABS
10.0000 mg | ORAL_TABLET | Freq: Every evening | ORAL | Status: DC | PRN
Start: 2013-08-09 — End: 2013-08-12
  Administered 2013-08-09 – 2013-08-11 (×2): 10 mg via ORAL
  Filled 2013-08-09 (×2): qty 1

## 2013-08-09 MED ORDER — MAGNESIUM HYDROXIDE 400 MG/5ML PO SUSP: 30.0000 mL | Freq: Every day | ORAL | Status: DC | PRN
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
## 2013-08-09 NOTE — Progress Notes (Signed)
D.  Pt not feeling well went to bed early.  Pt did not feel well enough to attend evening AA group.  Interacting appropriately within milieu.  Complaint of insomnia, no other complaints noted.  Denies SI/HI/hallucinations at this time.  A.  Support and encouragement offered  R.  Pt remains safe on unit, will continue to monitor.

## 2013-08-09 NOTE — Tx Team (Signed)
Initial Interdisciplinary Treatment Plan  PATIENT STRENGTHS: (choose at least two) Active sense of humor Average or above average intelligence General fund of knowledge Motivation for treatment/growth Supportive family/friends  PATIENT STRESSORS: Health problems Marital or family conflict argument with BF that provokoked this admission   PROBLEM LIST: Problem List/Patient Goals Date to be addressed Date deferred Reason deferred Estimated date of resolution  Depression 1/9     Anxiety 1/9     Increased risk for SI 1/9                                          DISCHARGE CRITERIA:  Improved stabilization in mood, thinking, and/or behavior Motivation to continue treatment in a less acute level of care Need for constant or close observation no longer present Verbal commitment to aftercare and medication compliance  PRELIMINARY DISCHARGE PLAN: Attend PHP/IOP  PATIENT/FAMIILY INVOLVEMENT: This treatment plan has been presented to and reviewed with the patient, Morgan [name].  The patient and family have been given the opportunity to ask questions and make suggestions.  [name] A 08/09/2013, 6:47 AM

## 2013-08-09 NOTE — [facility] Group Notes (Signed)
Adult Psychoeducational Group Note  Date:  08/09/2013 Time:  10:42 PM  Group Topic/Focus:  Wrap-Up Group:   The focus of this group is to help patients review their daily goal of treatment and discuss progress on daily workbooks.  Participation Level:  Active  Participation Quality:  Appropriate  Affect:  Flat  Cognitive:  Appropriate  Insight: Appropriate  Engagement in Group:  Engaged  Modes of Intervention:  Discussion  Additional Comments:  Morgan [name] attended wrap-up group on 500.  Morgan RancherLindsay, Morgan [name] 08/09/2013, 10:42 PM
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
## 2013-08-09 NOTE — ED Provider Notes (Signed)
Medical screening examination/treatment/procedure(s) were performed by non-physician practitioner and as supervising physician I was immediately available for consultation/collaboration.  EKG Interpretation   None        Juliet RudeNathan R. Rubin PayorPickering, MD 08/09/13 [id number]
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
## 2013-08-09 NOTE — [facility] Group Notes (Signed)
[facility] Note   08/09/2013 11:50 AM  Participation Quality:  DID NOT ATTEND-pt meeting with MD during group time.   Smart, American FinancialHeather LCSWA

## 2013-08-09 NOTE — Progress Notes (Signed)
D Morgan StanfordJenna is seen OOB UAL on the 300 hall today...tolerated fair. She requests her nicotine be DC'd and this is done.     A SHe completes her AM self inventory and on it she writes she is positive for SI within the past 24 hrs, she rates her depression and hopelessness "10/9" and she writes  Her DC plan is " doctor  And go to therapy"    R Safety is in place and poc cont.

## 2013-08-09 NOTE — [facility] Group Notes (Signed)
[facility] LCSW Group Therapy  08/09/2013 3:08 PM  Type of Therapy:  Group Therapy  Participation Level:  Did Not Attend-pt should be programming on 500 hall.    Smart, Simren Popson LCSWA  08/09/2013, 3:08 PM

## 2013-08-09 NOTE — [facility] Suicide Risk Assessment (Signed)
[facility] INPATIENT:  Family/Significant Other Suicide Prevention Education  Suicide Prevention Education:  Education Completed; Mitzi HansenChris Walters (pt's boyfriend) [phone] has been identified by the patient as the family member/significant other with whom the patient will be residing, and identified as the person(s) who will aid the patient in the event of a mental health crisis (suicidal ideations/suicide attempt).  With written consent from the patient, the family member/significant other has been provided the following suicide prevention education, prior to the and/or following the discharge of the patient.  The suicide prevention education provided includes the following:  Suicide risk factors  Suicide prevention and interventions  National Suicide Hotline telephone number  [facility] assessment telephone number  [facility] Emergency Assistance 911  [facility] and/or Residential Mobile Crisis Unit telephone number  Request made of family/significant other to:  Remove weapons (e.g., guns, rifles, knives), all items previously/currently identified as safety concern.    Remove drugs/medications (over-the-counter, prescriptions, illicit drugs), all items previously/currently identified as a safety concern.  The family member/significant other verbalizes understanding of the suicide prevention education information provided.  The family member/significant other agrees to remove the items of safety concern listed above.  Smart, Georgeanna Radziewicz LCSWA  08/09/2013, 12:58 PM

## 2013-08-10 DIAGNOSIS — M549 Dorsalgia, unspecified: Secondary | ICD-10-CM

## 2013-08-10 DIAGNOSIS — F909 Attention-deficit hyperactivity disorder, unspecified type: Secondary | ICD-10-CM

## 2013-08-10 DIAGNOSIS — F3289 Other specified depressive episodes: Secondary | ICD-10-CM

## 2013-08-10 DIAGNOSIS — F329 Major depressive disorder, single episode, unspecified: Secondary | ICD-10-CM

## 2013-08-10 DIAGNOSIS — F4310 Post-traumatic stress disorder, unspecified: Secondary | ICD-10-CM

## 2013-08-10 DIAGNOSIS — F319 Bipolar disorder, unspecified: Secondary | ICD-10-CM

## 2013-08-10 NOTE — Progress Notes (Signed)
Seen and agreed. Neria Procter, MD 

## 2013-08-10 NOTE — Progress Notes (Signed)
Spoke with pt 1:1 who is flat, sad and depressed. Reports prn vistaril she received after dinner did not really help. "I have had bad thoughts. I have PTSD and a lot of flashbacks." Endorses passive SI but contracts for safety. Pt given support, encouragement. Medicated per orders. Denies AVH/HI and remains safe. Elected not to attend evening group. Presently resting in bed. Lawrence MarseillesFriedman, Nancee Brownrigg Eakes

## 2013-08-10 NOTE — [facility] Group Notes (Signed)
[facility] Group Notes: (Clinical Social Work)   08/10/2013      Type of Therapy:  Group Therapy   Participation Level:  Did Not Attend    Ambrose MantleMareida Grossman-Orr, LCSW 08/10/2013, 12:48 PM

## 2013-08-10 NOTE — [facility] Group Notes (Signed)
[facility] Group Notes:  (Nursing/MHT/Case Management/Adjunct)  Date:  08/10/2013  Time:  0900  Type of Therapy:  Nurse Education  Participation Level:  Did Not Attend  Participation Quality:  did not attend  Affect:  did not attend  Cognitive:  did not attend  Insight:  None  Engagement in Group:  did not attend  Modes of Intervention:  did not attend  Summary of Progress/Problems:  Morgan [name], Morgan [name] 08/10/2013, 10:40 AM

## 2013-08-10 NOTE — Progress Notes (Signed)
Patient ID: [name], female   DOB: [birth year], 23 y.o.   MRN: [id number]  D: Patient pleasant on approach. Reports depression "9" and hopelessness "8" at present. Reported some passive SI in the past 24 hours but denies at present. A: Staff will monitor on q 15 minute checks, follow treatment plan, and give meds as ordered. R: Cooperative at present.

## 2013-08-10 NOTE — Progress Notes (Signed)
[facility]  08/10/2013 12:35 PM Morgan [name]  MRN:  [id number] Subjective:  Patient was smiling but obviously depressed.  She became tearful and started crying when this was discussed.  Morgan [name] has poor self-esteem and feels "unlovable".   Diagnosis:   DSM5:  Axis I: ADHD, hyperactive type, Anxiety Disorder NOS, Bipolar, Depressed and Post Traumatic Stress Disorder Axis II: Deferred Axis III:  Past Medical History  Diagnosis Date  . Eczema   . TMJ (dislocation of temporomandibular joint)   . Asthma   . Anxiety   . Depression   . ADHD (attention deficit hyperactivity disorder)   . PTSD (post-traumatic stress disorder)    Axis IV: other psychosocial or environmental problems, problems related to social environment and problems with primary support group Axis V: 41-50 serious symptoms  ADL's:  Intact  Sleep: Fair  Appetite:  Fair  Suicidal Ideation:  Plan:  overdose Intent:  yes Means:  yes Homicidal Ideation:  Denies   Psychiatric Specialty Exam: Review of Systems  Constitutional: Negative.   HENT: Negative.   Eyes: Negative.   Respiratory: Negative.   Cardiovascular: Negative.   Gastrointestinal: Negative.   Genitourinary: Negative.   Musculoskeletal: Negative.   Skin: Negative.   Neurological: Negative.   Endo/Heme/Allergies: Negative.   Psychiatric/Behavioral: Positive for depression and suicidal ideas. The patient is nervous/anxious.     Blood pressure 117/82, pulse 99, temperature 98.1 F (36.7 C), temperature source Oral, resp. rate 16, last menstrual period 08/04/2013.There is no weight on file to calculate BMI.  General Appearance: Casual  Eye Contact::  Fair  Speech:  Slow  Volume:  Decreased  Mood:  Anxious and Depressed  Affect:  Non-Congruent  Thought Process:  Coherent  Orientation:  Full (Time, Place, and Person)  Thought Content:  Rumination  Suicidal Thoughts:  Yes.  with intent/plan  Homicidal Thoughts:  No  Memory:  Immediate;    Fair Recent;   Fair Remote;   Fair  Judgement:  Poor  Insight:  Lacking  Psychomotor Activity:  Decreased  Concentration:  Fair  Recall:  Fair  Akathisia:  No  Handed:  Right  AIMS (if indicated):     Assets:  Physical Health Resilience Social Support  Sleep:  Number of Hours: 6.75   Current Medications: Current Facility-Administered Medications  Medication Dose Route Frequency Provider Last Rate Last Dose  . acetaminophen (TYLENOL) tablet 650 mg  650 mg Oral Q6H PRN Kristeen MansFran E Hobson, NP      . alum & mag hydroxide-simeth (MAALOX/MYLANTA) 200-200-20 MG/5ML suspension 30 mL  30 mL Oral Q4H PRN Kristeen MansFran E Hobson, NP      . DULoxetine (CYMBALTA) DR capsule 30 mg  30 mg Oral Daily Kristeen MansFran E Hobson, NP   30 mg at 08/10/13 0816  . hydrOXYzine (ATARAX/VISTARIL) tablet 25 mg  25 mg Oral Q6H PRN Rachael FeeIrving A Lugo, MD      . lithium carbonate (LITHOBID) CR tablet 300 mg  300 mg Oral Q12H Rachael FeeIrving A Lugo, MD   300 mg at 08/10/13 0817  . magnesium hydroxide (MILK OF MAGNESIA) suspension 30 mL  30 mL Oral Daily PRN Kristeen MansFran E Hobson, NP      . montelukast (SINGULAIR) tablet 10 mg  10 mg Oral QHS Kristeen MansFran E Hobson, NP   10 mg at 08/09/13 2146  . nicotine (NICODERM CQ - dosed in mg/24 hours) patch 21 mg  21 mg Transdermal Daily Kristeen MansFran E Hobson, NP   21 mg at 08/09/13 0800  .  ondansetron (ZOFRAN-ODT) disintegrating tablet 4 mg  4 mg Oral Q8H PRN Kristeen Mans, NP      . pantoprazole (PROTONIX) EC tablet 40 mg  40 mg Oral Daily Kristeen Mans, NP   40 mg at 08/10/13 0816  . zolpidem (AMBIEN) tablet 10 mg  10 mg Oral QHS PRN Rachael Fee, MD   10 mg at 08/09/13 2146    Lab Results:  Results for orders placed during the hospital encounter of 08/08/13 (from the past 48 hour(s))  GLUCOSE, CAPILLARY     Status: Abnormal   Collection Time    08/08/13 11:39 PM      Result Value Range   Glucose-Capillary 104 (*) 70 - 99 mg/dL    Physical Findings: AIMS:  , ,  ,  ,    CIWA:  CIWA-Ar Total: 0 COWS:     Treatment Plan  Summary: Daily contact with patient to assess and evaluate symptoms and progress in treatment Medication management  Plan:  Review of chart, vital signs, medications, and notes. 1-Individual and group therapy 2-Medication management for depression and anxiety:  Medications reviewed with the patient and she stated no untoward effects, no changes made 3-Coping skills for depression, anxiety 4-Continue crisis stabilization and management 5-Address health issues--monitoring vital signs, stable 6-Treatment plan in progress to prevent relapse of depression and anxiety  Medical Decision Making Problem Points:  Established problem, stable/improving (1) and Review of psycho-social stressors (1) Data Points:  Review of medication regiment & side effects (2)  I certify that inpatient services furnished can reasonably be expected to improve the patient's condition.   Nanine Means, PMH-NP 08/10/2013, 12:35 PM

## 2013-08-10 NOTE — Progress Notes (Signed)
Psychoeducational Group Note  Date:  08/10/2013 Time: 2100  Group Topic/Focus:  wrap up group  Participation Level: Did Not Attend  Participation Quality:  Not Applicable  Affect:  Not Applicable  Cognitive:  Not Applicable  Insight:  Not Applicable  Engagement in Group: Not Applicable  Additional Comments:  Pt was notified that group was beginning but pt remained in bed.   Shelah LewandowskySquires, Madeeha Costantino Carol 08/10/2013, 10:01 PM

## 2013-08-11 NOTE — Progress Notes (Signed)
Patient ID: Morgan [name], female   DOB: 28-Aug-1990, 23 y.o.   MRN: [id number] [name] MD Progress Note  08/11/2013 7:00 PM Morgan [name]  MRN:  [id number]  Subjective:  Morgan [name] reports, "My PTSD is causing me a lot of flash backs . My mood is up and down over the last few days. But, I'm feeling some improvement today in my mood".  Diagnosis:   DSM5: Axis I: ADHD, hyperactive type, Anxiety Disorder NOS, Bipolar, Depressed and Post Traumatic Stress Disorder Axis II: Deferred Axis III:  Past Medical History  Diagnosis Date  . Eczema   . TMJ (dislocation of temporomandibular joint)   . Asthma   . Anxiety   . Depression   . ADHD (attention deficit hyperactivity disorder)   . PTSD (post-traumatic stress disorder)    Axis IV: other psychosocial or environmental problems, problems related to social environment and problems with primary support group Axis V: 41-50 serious symptoms  ADL's:  Intact  Sleep: Fair  Appetite:  Fair  Suicidal Ideation:  Plan:  overdose Intent:  yes Means:  yes Homicidal Ideation:  Denies   Psychiatric Specialty Exam: Review of Systems  Constitutional: Negative.   HENT: Negative.   Eyes: Negative.   Respiratory: Negative.   Cardiovascular: Negative.   Gastrointestinal: Negative.   Genitourinary: Negative.   Musculoskeletal: Negative.   Skin: Negative.   Neurological: Negative.   Endo/Heme/Allergies: Negative.   Psychiatric/Behavioral: Positive for depression and suicidal ideas. The patient is nervous/anxious.     Blood pressure 121/80, pulse 96, temperature 98.3 F (36.8 C), temperature source Oral, resp. rate 16, last menstrual period 08/04/2013.There is no weight on file to calculate BMI.  General Appearance: Casual  Eye Contact::  Fair  Speech:  Slow  Volume:  Decreased  Mood:  Anxious and Depressed  Affect:  Non-Congruent  Thought Process:  Coherent  Orientation:  Full (Time, Place, and Person)  Thought Content:  Rumination  Suicidal  Thoughts:  Yes.  with intent/plan  Homicidal Thoughts:  No  Memory:  Immediate;   Fair Recent;   Fair Remote;   Fair  Judgement:  Poor  Insight:  Lacking  Psychomotor Activity:  Decreased  Concentration:  Fair  Recall:  Fair  Akathisia:  No  Handed:  Right  AIMS (if indicated):     Assets:  Physical Health Resilience Social Support  Sleep:  Number of Hours: 6.5   Current Medications: Current Facility-Administered Medications  Medication Dose Route Frequency Provider Last Rate Last Dose  . acetaminophen (TYLENOL) tablet 650 mg  650 mg Oral Q6H PRN Kristeen Mans, NP      . alum & mag hydroxide-simeth (MAALOX/MYLANTA) 200-200-20 MG/5ML suspension 30 mL  30 mL Oral Q4H PRN Kristeen Mans, NP      . DULoxetine (CYMBALTA) DR capsule 30 mg  30 mg Oral Daily Kristeen Mans, NP   30 mg at 08/11/13 0806  . hydrOXYzine (ATARAX/VISTARIL) tablet 25 mg  25 mg Oral Q6H PRN Rachael Fee, MD   25 mg at 08/10/13 1817  . lithium carbonate (LITHOBID) CR tablet 300 mg  300 mg Oral Q12H Rachael Fee, MD   300 mg at 08/11/13 0806  . magnesium hydroxide (MILK OF MAGNESIA) suspension 30 mL  30 mL Oral Daily PRN Kristeen Mans, NP      . montelukast (SINGULAIR) tablet 10 mg  10 mg Oral QHS Kristeen Mans, NP   10 mg at 08/09/13 2146  . nicotine (NICODERM  CQ - dosed in mg/24 hours) patch 21 mg  21 mg Transdermal Daily Kristeen MansFran E Hobson, NP   21 mg at 08/11/13 0806  . ondansetron (ZOFRAN-ODT) disintegrating tablet 4 mg  4 mg Oral Q8H PRN Kristeen MansFran E Hobson, NP      . pantoprazole (PROTONIX) EC tablet 40 mg  40 mg Oral Daily Kristeen MansFran E Hobson, NP   40 mg at 08/11/13 0806  . zolpidem (AMBIEN) tablet 10 mg  10 mg Oral QHS PRN Rachael FeeIrving A Lugo, MD   10 mg at 08/09/13 2146    Lab Results:  No results found for this or any previous visit (from the past 48 hour(s)).  Physical Findings: AIMS:  , ,  ,  ,    CIWA:  CIWA-Ar Total: 0 COWS:     Treatment Plan Summary: Daily contact with patient to assess and evaluate symptoms and  progress in treatment Medication management  Plan:  Supportive approach/coping skills/relapse prevention. Encouraged out of room, participation in group sessions and application of coping skills when distressed. Will continue to monitor response to/adverse effects of medications in use to assure effectiveness. Continue to monitor mood, behavior and interaction with staff and other patients. Continue current plan of care.  Medical Decision Making Problem Points:  Established problem, stable/improving (1) and Review of psycho-social stressors (1) Data Points:  Review of medication regiment & side effects (2)  I certify that inpatient services furnished can reasonably be expected to improve the patient's condition.   Armandina Stammerwoko, Cleto Claggett I, PMH-NP 08/11/2013, 7:00 PM

## 2013-08-11 NOTE — Progress Notes (Signed)
Patient did attend the evening speaker AA meeting.  
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
## 2013-08-11 NOTE — Progress Notes (Signed)
Spoke with pt 1:1 who is smiling and more relaxed than last evening. Boyfriend who was visiting just left. She endorses flashbacks but overall feels her mood is improving. Some passive SI earlier in the day but is able to deny now. Pt offered support, encouragement. Verbally contracts for safety with sustained eye contact. No HI/AVH and pt is safe. Lawrence MarseillesFriedman, Mikisha Roseland Eakes

## 2013-08-11 NOTE — [facility] Group Notes (Signed)
[facility] Group Notes: (Clinical Social Work)   08/11/2013      Type of Therapy:  Group Therapy   Participation Level:  Did Not Attend    Ambrose MantleMareida Grossman-Orr, LCSW 08/11/2013, 12:45 PM

## 2013-08-12 MED ORDER — MONTELUKAST SODIUM 10 MG PO TABS: 10.0000 mg | ORAL_TABLET | Freq: Every day | ORAL | Status: DC

## 2013-08-12 MED ORDER — ZOLPIDEM TARTRATE 10 MG PO TABS: 10.0000 mg | ORAL_TABLET | Freq: Every evening | ORAL | Status: DC | PRN

## 2013-08-12 MED ORDER — DULOXETINE HCL 30 MG PO CPEP: 30.0000 mg | ORAL_CAPSULE | Freq: Every day | ORAL | Status: DC

## 2013-08-12 MED ORDER — LITHIUM CARBONATE ER 300 MG PO TBCR: 300.0000 mg | EXTENDED_RELEASE_TABLET | Freq: Two times a day (BID) | ORAL | Status: DC

## 2013-08-12 MED ORDER — HYDROXYZINE HCL 25 MG PO TABS: 25.0000 mg | ORAL_TABLET | Freq: Four times a day (QID) | ORAL | Status: DC | PRN

## 2013-08-12 MED ORDER — FLUTICASONE PROPIONATE 50 MCG/ACT NA SUSP: 2.0000 | Freq: Every day | NASAL | Status: DC | PRN

## 2013-08-12 MED ORDER — OMEPRAZOLE 20 MG PO CPDR: 20.0000 mg | DELAYED_RELEASE_CAPSULE | Freq: Every day | ORAL | Status: DC
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
## 2013-08-12 NOTE — [facility] Group Notes (Signed)
[facility]   08/12/2013 9:55 AM  Participation Quality:  Appropriate   Mood/Affect:  Appropriate  Depression Rating:  2  Anxiety Rating:  2  Thoughts of Suicide:  No Will you contract for safety?   NA  Current AVH:  No  Plan for Discharge/Comments:  Pt reports that she is d/cing today and plans to return home with her boyfriend. Pt to follow up with therapist from [facility] and will see Dr. Jannifer FranklinAkintayo for med management.   Transportation Means: boyfriend   Supports: boyfriend/family supports.   Smart, American FinancialHeather LCSWA
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
## 2013-08-12 NOTE — [facility] Group Notes (Signed)
[facility] LCSW Group Therapy  08/12/2013 2:24 PM  Type of Therapy:  Group Therapy  Participation Level:  Did Not Attend-pt in process of discharging/meeting with MD/RN  [name], Morgan Saye LCSWA  08/12/2013, 2:24 PM

## 2013-08-12 NOTE — Progress Notes (Signed)
[facility] :  Will you be returning to the same living situation after discharge: Yes,  home At discharge, do you have transportation home?:Yes,  boyfriend Do you have the ability to pay for your medications:Yes,  [employer]  Release of information consent forms completed and submitted to Medical Records by CSW.   Patient to Follow up at: Follow-up Information   Follow up with [facility] . (Voicemail left with Malcolm MetroJokena Islam. Social worker or Alexis FrockJokena will contact you direclty with appt date and time for therapy.  )    Contact information:   [address] [place], [place] [address] Phone: [phone] Fax: ?      Follow up with [facility]-Dr. Thedore MinsMojeed Akintayo. (The office will call you directly with appointment time and date. )    Contact information:   [address] [place], [place] [address] Phone: [phone] Fax: [phone]      Patient denies SI/HI:   Yes,  during group/self report.    Safety Planning and Suicide Prevention discussed:  Yes,  SPE completed with pt's boyfriend. Pt also provided with SPI pamphlet and encouraged to share information with support network, ask questions, and talk about any concerns relating to SPE.   Smart, Brindle Leyba LCSWA  08/12/2013, 11:08 AM

## 2013-08-12 NOTE — Progress Notes (Signed)
Pt is D/C home. Pt denies SI/HI/AV. Pt follow-up and medications were reviewed and pt verbalized understanding. Pt pleasant and cooperative. Pt belongings were returned.   

## 2013-08-16 NOTE — Progress Notes (Addendum)
Patient Discharge Instructions:  After Visit Summary (AVS):   Faxed to:  08/16/13 Discharge Summary Note:   Faxed to:  08/16/13 Psychiatric Admission Assessment Note:   Faxed to:  08/16/13 Suicide Risk Assessment - Discharge Assessment:   Faxed to:  08/16/13 Faxed/Sent to the Next Level Care provider:  08/16/13 Faxed to [facility] @ [phone] Faxed to [facility] @ [phone]  Jerelene ReddenSheena E Avon, 08/16/2013, 3:06 PM

## 2013-08-17 ENCOUNTER — Ambulatory Visit (INDEPENDENT_AMBULATORY_CARE_PROVIDER_SITE_OTHER): Payer: 59 | Admitting: Family Medicine

## 2013-08-17 VITALS — BP 156/90 | HR 101 | Temp 97.9°F | Resp 18 | Ht 65.5 in | Wt 251.6 lb

## 2013-08-17 DIAGNOSIS — F411 Generalized anxiety disorder: Secondary | ICD-10-CM

## 2013-08-17 DIAGNOSIS — Z79899 Other long term (current) drug therapy: Secondary | ICD-10-CM

## 2013-08-17 LAB — POCT CBC
Granulocyte percent: 68.5 %G (ref 37–80)
HCT, POC: 42.3 % (ref 37.7–47.9)
Hemoglobin: 12.9 g/dL (ref 12.2–16.2)
Lymph, poc: 3 (ref 0.6–3.4)
MCH, POC: 26.4 pg — AB (ref 27–31.2)
MCHC: 30.5 g/dL — AB (ref 31.8–35.4)
MCV: 86.6 fL (ref 80–97)
MID (cbc): 0.6 (ref 0–0.9)
MPV: 9.4 fL (ref 0–99.8)
POC Granulocyte: 7.9 — AB (ref 2–6.9)
POC LYMPH PERCENT: 26.1 %L (ref 10–50)
POC MID %: 5.4 %M (ref 0–12)
Platelet Count, POC: 344 10*3/uL (ref 142–424)
RBC: 4.88 M/uL (ref 4.04–5.48)
RDW, POC: 15.5 %
WBC: 11.6 10*3/uL — AB (ref 4.6–10.2)

## 2013-08-17 MED ORDER — BUSPIRONE HCL 10 MG PO TABS: 10.0000 mg | ORAL_TABLET | Freq: Three times a day (TID) | ORAL | Status: DC

## 2013-08-17 NOTE — Progress Notes (Signed)
23 yo woman who had recent suicide attempt with hospitalization for 3 days and discharged on lithium 300 mg bid  4 days ago.  She has a follow up appointment on Feb 9th (4 weeks after discharge)  Sleep:  poor Emotions:   numb Thinking:  Having flashbacks, recurring thoughts of the suicide and negative thoughts about self. Appetite:  Varies  Social:  Back with boyfriend.  Renting apartment.  Boyfriend works at [employer].  Has not had any lithium level drawn.  She works at [employer] and needs short term disability forms filled out  She is curious about a betablocker for flashbacks.  Objective:  Appropriate.  Denies suicidal ideation  Chest:  Clear Heart: reg, no murmur  Assessment:  23 yo with significant psychiatric problems and apparently loose follow up plans.    Plan:  Recheck Wednesday. High risk medication use - Plan: Lithium level, busPIRone (BUSPAR) 10 MG tablet  Anxiety state, unspecified - Plan: busPIRone (BUSPAR) 10 MG tablet  Signed, Elvina SidleKurt Javante Nilsson, MD

## 2013-08-21 ENCOUNTER — Encounter: Payer: Self-pay | Admitting: Family Medicine

## 2013-08-21 LAB — LITHIUM LEVEL: Lithium Lvl: 0.4 mmol/L — ABNORMAL LOW (ref 0.6–1.4)

## 2013-10-03 DIAGNOSIS — Z0271 Encounter for disability determination: Secondary | ICD-10-CM

## 2013-10-09 ENCOUNTER — Ambulatory Visit: Payer: 59

## 2013-10-09 ENCOUNTER — Ambulatory Visit (INDEPENDENT_AMBULATORY_CARE_PROVIDER_SITE_OTHER): Payer: 59 | Admitting: Family Medicine

## 2013-10-09 VITALS — BP 132/76 | HR 84 | Temp 98.8°F | Resp 18 | Ht 66.0 in | Wt 250.0 lb

## 2013-10-09 DIAGNOSIS — R079 Chest pain, unspecified: Secondary | ICD-10-CM

## 2013-10-09 DIAGNOSIS — F319 Bipolar disorder, unspecified: Secondary | ICD-10-CM

## 2013-10-09 DIAGNOSIS — Z9119 Patient's noncompliance with other medical treatment and regimen: Secondary | ICD-10-CM

## 2013-10-09 DIAGNOSIS — F410 Panic disorder [episodic paroxysmal anxiety] without agoraphobia: Secondary | ICD-10-CM

## 2013-10-09 DIAGNOSIS — Z91199 Patient's noncompliance with other medical treatment and regimen due to unspecified reason: Secondary | ICD-10-CM

## 2013-10-09 DIAGNOSIS — H61009 Unspecified perichondritis of external ear, unspecified ear: Secondary | ICD-10-CM

## 2013-10-09 LAB — POCT CBC
Granulocyte percent: 70 %G (ref 37–80)
HEMATOCRIT: 37.7 % (ref 37.7–47.9)
HEMOGLOBIN: 11.7 g/dL — AB (ref 12.2–16.2)
LYMPH, POC: 2.5 (ref 0.6–3.4)
MCH: 26.1 pg — AB (ref 27–31.2)
MCHC: 31 g/dL — AB (ref 31.8–35.4)
MCV: 83.9 fL (ref 80–97)
MID (cbc): 0.5 (ref 0–0.9)
MPV: 10.1 fL (ref 0–99.8)
POC Granulocyte: 7 — AB (ref 2–6.9)
POC LYMPH PERCENT: 25.1 %L (ref 10–50)
POC MID %: 4.9 %M (ref 0–12)
Platelet Count, POC: 285 10*3/uL (ref 142–424)
RBC: 4.49 M/uL (ref 4.04–5.48)
RDW, POC: 14.6 %
WBC: 10 10*3/uL (ref 4.6–10.2)

## 2013-10-09 MED ORDER — CLONAZEPAM 0.5 MG PO TABS: 0.5000 mg | ORAL_TABLET | Freq: Two times a day (BID) | ORAL | Status: DC | PRN

## 2013-10-09 NOTE — Patient Instructions (Addendum)
Your EKG, blood count and xray appear ok tonight. You likely are having some anxiety or panic attacks, but this is complicated by coming off your usual psychiatric medications.  I will write for a few days of klonopin until you can be seen by your psychiatrist and therapist, but you need to call their offices tomorrow to arrange follow up and to advise them that you stopped your other medicines and to discuss the plan. Return to the clinic or go to the nearest emergency room if any of your symptoms worsen or new symptoms occur. If any worsening of chest pain, suicidal thoughts, or any other worsening symptoms - go to an emergency room.   Panic Attacks Panic attacks are sudden, short-livedsurges of severe anxiety, fear, or discomfort. They may occur for no reason when you are relaxed, when you are anxious, or when you are sleeping. Panic attacks may occur for a number of reasons:   Healthy people occasionally have panic attacks in extreme, life-threatening situations, such as war or natural disasters. Normal anxiety is a protective mechanism of the body that helps [place] react to danger (fight or flight response).  Panic attacks are often seen with anxiety disorders, such as panic disorder, social anxiety disorder, generalized anxiety disorder, and phobias. Anxiety disorders cause excessive or uncontrollable anxiety. They may interfere with your relationships or other life activities.  Panic attacks are sometimes seen with other mental illnesses such as depression and posttraumatic stress disorder.  Certain medical conditions, prescription medicines, and drugs of abuse can cause panic attacks. SYMPTOMS  Panic attacks start suddenly, peak within 20 minutes, and are accompanied by four or more of the following symptoms:  Pounding heart or fast heart rate (palpitations).  Sweating.  Trembling or shaking.  Shortness of breath or feeling smothered.  Feeling choked.  Chest pain or  discomfort.  Nausea or strange feeling in your stomach.  Dizziness, lightheadedness, or feeling like you will faint.  Chills or hot flushes.  Numbness or tingling in your lips or hands and feet.  Feeling that things are not real or feeling that you are not yourself.  Fear of losing control or going crazy.  Fear of dying. Some of these symptoms can mimic serious medical conditions. For example, you may think you are having a heart attack. Although panic attacks can be very scary, they are not life threatening. DIAGNOSIS  Panic attacks are diagnosed through an assessment by your health care provider. Your health care provider will ask questions about your symptoms, such as where and when they occurred. Your health care provider will also ask about your medical history and use of alcohol and drugs, including prescription medicines. Your health care provider may order blood tests or other studies to rule out a serious medical condition. Your health care provider may refer you to a mental health professional for further evaluation. TREATMENT   Most healthy people who have one or two panic attacks in an extreme, life-threatening situation will not require treatment.  The treatment for panic attacks associated with anxiety disorders or other mental illness typically involves counseling with a mental health professional, medicine, or a combination of both. Your health care provider will help determine what treatment is best for you.  Panic attacks due to physical illness usually goes away with treatment of the illness. If prescription medicine is causing panic attacks, talk with your health care provider about stopping the medicine, decreasing the dose, or substituting another medicine.  Panic attacks due to alcohol or  drug abuse goes away with abstinence. Some adults need professional help in order to stop drinking or using drugs. HOME CARE INSTRUCTIONS   Take all your medicines as prescribed.    Check with your health care provider before starting new prescription or over-the-counter medicines.  Keep all follow up appointments with your health care provider. SEEK MEDICAL CARE IF:  You are not able to take your medicines as prescribed.  Your symptoms do not improve or get worse. SEEK IMMEDIATE MEDICAL CARE IF:   You experience panic attack symptoms that are different than your usual symptoms.  You have serious thoughts about hurting yourself or others.  You are taking medicine for panic attacks and have a serious side effect. MAKE SURE YOU:  Understand these instructions.  Will watch your condition.  Will get help right away if you are not doing well or get worse. Document Released: 07/18/2005 Document Revised: 05/08/2013 Document Reviewed: 03/01/2013 [facility] Patient Information 2014 [place], [place].   Chest Pain (Nonspecific) It is often hard to give a specific diagnosis for the cause of chest pain. There is always a chance that your pain could be related to something serious, such as a heart attack or a blood clot in the lungs. You need to follow up with your caregiver for further evaluation. CAUSES   Heartburn.  Pneumonia or bronchitis.  Anxiety or stress.  Inflammation around your heart (pericarditis) or lung (pleuritis or pleurisy).  A blood clot in the lung.  A collapsed lung (pneumothorax). It can develop suddenly on its own (spontaneous pneumothorax) or from injury (trauma) to the chest.  Shingles infection (herpes zoster virus). The chest wall is composed of bones, muscles, and cartilage. Any of these can be the source of the pain.  The bones can be bruised by injury.  The muscles or cartilage can be strained by coughing or overwork.  The cartilage can be affected by inflammation and become sore (costochondritis). DIAGNOSIS  Lab tests or other studies, such as X-rays, electrocardiography, stress testing, or cardiac imaging, may be needed to  find the cause of your pain.  TREATMENT   Treatment depends on what may be causing your chest pain. Treatment may include:  Acid blockers for heartburn.  Anti-inflammatory medicine.  Pain medicine for inflammatory conditions.  Antibiotics if an infection is present.  You may be advised to change lifestyle habits. This includes stopping smoking and avoiding alcohol, caffeine, and chocolate.  You may be advised to keep your head raised (elevated) when sleeping. This reduces the chance of acid going backward from your stomach into your esophagus.  Most of the time, nonspecific chest pain will improve within 2 to 3 days with rest and mild pain medicine. HOME CARE INSTRUCTIONS   If antibiotics were prescribed, take your antibiotics as directed. Finish them even if you start to feel better.  For the next few days, avoid physical activities that bring on chest pain. Continue physical activities as directed.  Do not smoke.  Avoid drinking alcohol.  Only take over-the-counter or prescription medicine for pain, discomfort, or fever as directed by your caregiver.  Follow your caregiver's suggestions for further testing if your chest pain does not go away.  Keep any follow-up appointments you made. If you do not go to an appointment, you could develop lasting (chronic) problems with pain. If there is any problem keeping an appointment, you must call to reschedule. SEEK MEDICAL CARE IF:   You think you are having problems from the medicine you are  taking. Read your medicine instructions carefully.  Your chest pain does not go away, even after treatment.  You develop a rash with blisters on your chest. SEEK IMMEDIATE MEDICAL CARE IF:   You have increased chest pain or pain that spreads to your arm, neck, jaw, back, or abdomen.  You develop shortness of breath, an increasing cough, or you are coughing up blood.  You have severe back or abdominal pain, feel nauseous, or vomit.  You  develop severe weakness, fainting, or chills.  You have a fever. THIS IS AN EMERGENCY. Do not wait to see if the pain will go away. Get medical help at once. Call your local emergency services (911 in [place] Do not drive yourself to the hospital. MAKE SURE YOU:   Understand these instructions.  Will watch your condition.  Will get help right away if you are not doing well or get worse. Document Released: 04/27/2005 Document Revised: 10/10/2011 Document Reviewed: 02/21/2008 [facility] Patient Information 2014 [place], [place].

## 2013-10-09 NOTE — Progress Notes (Addendum)
This chart was scribed for Meredith Staggers, MD by Luisa Dago, ED Scribe. This patient was seen in room 13 and the patient's care was started at 6:40 PM. Subjective:    Patient ID: Morgan [name], female    DOB: 03-Aug-1990, 23 y.o.   MRN: [id number]  Chief Complaint  Patient presents with  . Chest Pain    jaw pain x yesterday and today got worse  . Nausea    HPI Morgan [name] is a 23 y.o. female PCP: [name], MD  Pt was last seen by Dr. Milus Glazier on 08/17/2013 complaining of suicidal ideation. She was hospitalized for a suicidal attempt in January. She reports taking a mouthful of pills. Pt states that the pills were hers.   Today, pt is complaining of chest pain that started 2 days ago. Pt states that she was anxious when the pain started. She describes the pain as a burning sensation. The pain is located in the center of her chest. She is also complaining of jaw and bilateral arm paraesthesia. Pt states that when the onset started the pain would last about 30 minutes and go away. She states that she has been quite anxious lately. Reports trying to get off of Cymbalta for 3 weeks. She states that she was having associated suicidal ideations from the prescribed Cymbalta. She was prescribed Cymbalta for depression and fibromyalgia. Pt has a history of bipolar disorder. But she has been off of Lithium for 3 weeks. She states that she plans on getting back to her Lithium. Her psychiatrist does not know about her recent stoppage of prescribed medications. Advised pt to call her psychiatrist as soon as possible for evaluations.   She has been prescribed Ativan and Xanax in the past; she states that these drugs make her feel worse. Her last prescription for these drugs were 6 weeks ago. Pt states that her mother has a history of hypertension and heart murmur but not stents. Denies any family history of MIs. Pt denies any personal history of heart murmur. Morgan [name] has a history of PTSD  and she is currently taking Beta blockers to control her symptoms.   Pt states that she is working with a Veterinary surgeon. She states that due to finances she is unable to keep up with her normal drug regiment. Denies any alcohol use. Denies any IV drug use. She also denies smoking tobacco or other recreational drugs.   Psychiatrist: Dr. Laurian Brim    Patient Active Problem List   Diagnosis Date Noted  . Recurrent major depression-severe 08/09/2013  . Bipolar disorder, unspecified 08/09/2013  . PTSD (post-traumatic stress disorder) 08/09/2013  . Fibromyalgia 06/24/2013  . Back pain 06/24/2013  . ADHD (attention deficit hyperactivity disorder) 11/03/2011  . Depression 11/03/2011   Past Medical History  Diagnosis Date  . Eczema   . TMJ (dislocation of temporomandibular joint)   . Asthma   . Anxiety   . Depression   . ADHD (attention deficit hyperactivity disorder)   . PTSD (post-traumatic stress disorder)    Past Surgical History  Procedure Laterality Date  . Addenoid    . Foot surgery     Allergies  Allergen Reactions  . Food Anaphylaxis    Melons: any type in the melon family with cause anaphylaxis   . Peanut-Containing Drug Products Anaphylaxis  . Geodon [Ziprasidone Hcl] Other (See Comments)    Hypersomnolence   . Lactose Intolerance (Gi) Other (See Comments)    Reaction: GI upset  . Soy Allergy   .  Vitamin C Itching    Most food which have vitamin c causes itching  . Sulfa Antibiotics Rash    Childhood allergy   Prior to Admission medications   Medication Sig Start Date End Date Taking? Authorizing Provider  fluticasone (FLONASE) 50 MCG/ACT nasal spray Place 2 sprays into both nostrils daily as needed. For allergies 08/12/13  Yes Sanjuana KavaAgnes I Nwoko, NP  hydrOXYzine (ATARAX/VISTARIL) 25 MG tablet Take 1 tablet (25 mg total) by mouth every 6 (six) hours as needed for itching or anxiety. 08/12/13  Yes Sanjuana KavaAgnes I Nwoko, NP  montelukast (SINGULAIR) 10 MG tablet Take 1 tablet (10 mg  total) by mouth at bedtime. For asthma 08/12/13  Yes Sanjuana KavaAgnes I Nwoko, NP  omeprazole (PRILOSEC) 20 MG capsule Take 1 capsule (20 mg total) by mouth daily. For acid reflux 08/12/13  Yes Sanjuana KavaAgnes I Nwoko, NP  zolpidem (AMBIEN) 5 MG tablet Take 5 mg by mouth at bedtime as needed for sleep.   Yes Historical Provider, MD  zolpidem (AMBIEN) 10 MG tablet Take 1 tablet (10 mg total) by mouth at bedtime as needed for sleep. 08/12/13 09/11/13  Sanjuana KavaAgnes I Nwoko, NP   History   Social History  . Marital Status: Single    Spouse Name: N/A    Number of Children: N/A  . Years of Education: N/A   Occupational History  . Not on file.   Social History Main Topics  . Smoking status: Former Smoker -- 1.00 packs/day    Types: Cigarettes  . Smokeless tobacco: Never Used  . Alcohol Use: Yes     Comment: social  . Drug Use: No     Comment: occasional etoh use  . Sexual Activity: Yes    Birth Control/ Protection: Inserts   Other Topics Concern  . Not on file   Social History Narrative  . No narrative on file   Review of Systems  Constitutional: Negative for fatigue and unexpected weight change.  HENT: Positive for facial swelling (Jaw pain).   Respiratory: Negative for chest tightness.   Cardiovascular: Positive for chest pain. Negative for leg swelling.  Gastrointestinal: Negative for blood in stool.  Genitourinary: Negative for frequency.  Neurological: Negative for syncope and light-headedness.       Objective:   Physical Exam  Vitals reviewed. Constitutional: She is oriented to person, place, and time. She appears well-developed and well-nourished.  HENT:  Head: Normocephalic and atraumatic.  Eyes: Conjunctivae and EOM are normal. Pupils are equal, round, and reactive to light.  Neck: Carotid bruit is not present.  Cardiovascular: Normal rate, regular rhythm, normal heart sounds and intact distal pulses.  Exam reveals no gallop and no friction rub.   No murmur heard. Pulmonary/Chest: Effort  normal and breath sounds normal. No respiratory distress. She has no wheezes. She has no rales.  Abdominal: Soft. She exhibits no pulsatile midline mass. There is no tenderness.  Neurological: She is alert and oriented to person, place, and time.  Skin: Skin is warm and dry.  Psychiatric: She has a normal mood and affect. Her behavior is normal.    Last TSH normal 1.8 on 05/2013 Lithium level 0.4 on 1/17 EKG: Sinus Rhythm no acute findings, rate 75.   Filed Vitals:   10/09/13 1815  BP: 132/76  Pulse: 84  Temp: 98.8 F (37.1 C)  TempSrc: Oral  Resp: 18  Height: 5\' 6"  (1.676 m)  Weight: 250 lb (113.399 kg)  SpO2: 100%   Results for orders placed in visit on  10/09/13  POCT CBC      Result Value Ref Range   WBC 10.0  4.6 - 10.2 K/uL   Lymph, poc 2.5  0.6 - 3.4   POC LYMPH PERCENT 25.1  10 - 50 %L   MID (cbc) 0.5  0 - 0.9   POC MID % 4.9  0 - 12 %M   POC Granulocyte 7.0 (*) 2 - 6.9   Granulocyte percent 70.0  37 - 80 %G   RBC 4.49  4.04 - 5.48 M/uL   Hemoglobin 11.7 (*) 12.2 - 16.2 g/dL   HCT, POC 16.1  09.6 - 47.9 %   MCV 83.9  80 - 97 fL   MCH, POC 26.1 (*) 27 - 31.2 pg   MCHC 31.0 (*) 31.8 - 35.4 g/dL   RDW, POC 04.5     Platelet Count, POC 285  142 - 424 K/uL   MPV 10.1  0 - 99.8 fL   [facility] reading (PRIMARY) by  Dr. Neva Seat: CXR: NAD.      Assessment & Plan:   Morgan [name] is a 23 y.o. female Chest pain - Plan: POCT CBC, TSH, EKG 12-Lead, DG Chest 2 View  Panic attacks - Plan: clonazePAM (KLONOPIN) 0.5 MG tablet  Bipolar disorder, unspecified  History of nonadherence to medical treatment  Hx of Bipolar d/o and depression with prior suicidal attempt. Denies SI in office or recently.  Suspect panic attacks, or anxiety attacks as cause of chest sx's as accompanied by hand and perioral dysesthesias, along with association to stress. Complicated by self discontinuation of routine psychiatric meds including mood stabilizer and SNRI. Stressed importance of discussing  any concerns in medicine with her care providers, prior to stopping on own in future.  Plans on calling their office tomorrow to discuss meds and follow up. Will prescribe short course of klonopin or anxiety sx's as no SI and plans to discuss plan with psychiatrist tomorrow.  ER/RTC precautions discussed and understanding expressed.    Meds ordered this encounter  Medications  . zolpidem (AMBIEN) 5 MG tablet    Sig: Take 5 mg by mouth at bedtime as needed for sleep.  . clonazePAM (KLONOPIN) 0.5 MG tablet    Sig: Take 1 tablet (0.5 mg total) by mouth 2 (two) times daily as needed for anxiety.    Dispense:  10 tablet    Refill:  0   Patient Instructions  Your EKG, blood count and xray appear ok tonight. You likely are having some anxiety or panic attacks, but this is complicated by coming off your usual psychiatric medications.  I will write for a few days of klonopin until you can be seen by your psychiatrist and therapist, but you need to call their offices tomorrow to arrange follow up and to advise them that you stopped your other medicines and to discuss the plan. Return to the clinic or go to the nearest emergency room if any of your symptoms worsen or new symptoms occur. If any worsening of chest pain, suicidal thoughts, or any other worsening symptoms - go to an emergency room.   Panic Attacks Panic attacks are sudden, short-livedsurges of severe anxiety, fear, or discomfort. They may occur for no reason when you are relaxed, when you are anxious, or when you are sleeping. Panic attacks may occur for a number of reasons:   Healthy people occasionally have panic attacks in extreme, life-threatening situations, such as war or natural disasters. Normal anxiety is a protective mechanism  of the body that helps [place] react to danger (fight or flight response).  Panic attacks are often seen with anxiety disorders, such as panic disorder, social anxiety disorder, generalized anxiety disorder, and  phobias. Anxiety disorders cause excessive or uncontrollable anxiety. They may interfere with your relationships or other life activities.  Panic attacks are sometimes seen with other mental illnesses such as depression and posttraumatic stress disorder.  Certain medical conditions, prescription medicines, and drugs of abuse can cause panic attacks. SYMPTOMS  Panic attacks start suddenly, peak within 20 minutes, and are accompanied by four or more of the following symptoms:  Pounding heart or fast heart rate (palpitations).  Sweating.  Trembling or shaking.  Shortness of breath or feeling smothered.  Feeling choked.  Chest pain or discomfort.  Nausea or strange feeling in your stomach.  Dizziness, lightheadedness, or feeling like you will faint.  Chills or hot flushes.  Numbness or tingling in your lips or hands and feet.  Feeling that things are not real or feeling that you are not yourself.  Fear of losing control or going crazy.  Fear of dying. Some of these symptoms can mimic serious medical conditions. For example, you may think you are having a heart attack. Although panic attacks can be very scary, they are not life threatening. DIAGNOSIS  Panic attacks are diagnosed through an assessment by your health care provider. Your health care provider will ask questions about your symptoms, such as where and when they occurred. Your health care provider will also ask about your medical history and use of alcohol and drugs, including prescription medicines. Your health care provider may order blood tests or other studies to rule out a serious medical condition. Your health care provider may refer you to a mental health professional for further evaluation. TREATMENT   Most healthy people who have one or two panic attacks in an extreme, life-threatening situation will not require treatment.  The treatment for panic attacks associated with anxiety disorders or other mental illness  typically involves counseling with a mental health professional, medicine, or a combination of both. Your health care provider will help determine what treatment is best for you.  Panic attacks due to physical illness usually goes away with treatment of the illness. If prescription medicine is causing panic attacks, talk with your health care provider about stopping the medicine, decreasing the dose, or substituting another medicine.  Panic attacks due to alcohol or drug abuse goes away with abstinence. Some adults need professional help in order to stop drinking or using drugs. HOME CARE INSTRUCTIONS   Take all your medicines as prescribed.   Check with your health care provider before starting new prescription or over-the-counter medicines.  Keep all follow up appointments with your health care provider. SEEK MEDICAL CARE IF:  You are not able to take your medicines as prescribed.  Your symptoms do not improve or get worse. SEEK IMMEDIATE MEDICAL CARE IF:   You experience panic attack symptoms that are different than your usual symptoms.  You have serious thoughts about hurting yourself or others.  You are taking medicine for panic attacks and have a serious side effect. MAKE SURE YOU:  Understand these instructions.  Will watch your condition.  Will get help right away if you are not doing well or get worse. Document Released: 07/18/2005 Document Revised: 05/08/2013 Document Reviewed: 03/01/2013 [facility] Patient Information 2014 [place], [place].   Chest Pain (Nonspecific) It is often hard to give a specific diagnosis for the cause  of chest pain. There is always a chance that your pain could be related to something serious, such as a heart attack or a blood clot in the lungs. You need to follow up with your caregiver for further evaluation. CAUSES   Heartburn.  Pneumonia or bronchitis.  Anxiety or stress.  Inflammation around your heart (pericarditis) or lung  (pleuritis or pleurisy).  A blood clot in the lung.  A collapsed lung (pneumothorax). It can develop suddenly on its own (spontaneous pneumothorax) or from injury (trauma) to the chest.  Shingles infection (herpes zoster virus). The chest wall is composed of bones, muscles, and cartilage. Any of these can be the source of the pain.  The bones can be bruised by injury.  The muscles or cartilage can be strained by coughing or overwork.  The cartilage can be affected by inflammation and become sore (costochondritis). DIAGNOSIS  Lab tests or other studies, such as X-rays, electrocardiography, stress testing, or cardiac imaging, may be needed to find the cause of your pain.  TREATMENT   Treatment depends on what may be causing your chest pain. Treatment may include:  Acid blockers for heartburn.  Anti-inflammatory medicine.  Pain medicine for inflammatory conditions.  Antibiotics if an infection is present.  You may be advised to change lifestyle habits. This includes stopping smoking and avoiding alcohol, caffeine, and chocolate.  You may be advised to keep your head raised (elevated) when sleeping. This reduces the chance of acid going backward from your stomach into your esophagus.  Most of the time, nonspecific chest pain will improve within 2 to 3 days with rest and mild pain medicine. HOME CARE INSTRUCTIONS   If antibiotics were prescribed, take your antibiotics as directed. Finish them even if you start to feel better.  For the next few days, avoid physical activities that bring on chest pain. Continue physical activities as directed.  Do not smoke.  Avoid drinking alcohol.  Only take over-the-counter or prescription medicine for pain, discomfort, or fever as directed by your caregiver.  Follow your caregiver's suggestions for further testing if your chest pain does not go away.  Keep any follow-up appointments you made. If you do not go to an appointment, you could  develop lasting (chronic) problems with pain. If there is any problem keeping an appointment, you must call to reschedule. SEEK MEDICAL CARE IF:   You think you are having problems from the medicine you are taking. Read your medicine instructions carefully.  Your chest pain does not go away, even after treatment.  You develop a rash with blisters on your chest. SEEK IMMEDIATE MEDICAL CARE IF:   You have increased chest pain or pain that spreads to your arm, neck, jaw, back, or abdomen.  You develop shortness of breath, an increasing cough, or you are coughing up blood.  You have severe back or abdominal pain, feel nauseous, or vomit.  You develop severe weakness, fainting, or chills.  You have a fever. THIS IS AN EMERGENCY. Do not wait to see if the pain will go away. Get medical help at once. Call your local emergency services (911 in [place] Do not drive yourself to the hospital. MAKE SURE YOU:   Understand these instructions.  Will watch your condition.  Will get help right away if you are not doing well or get worse. Document Released: 04/27/2005 Document Revised: 10/10/2011 Document Reviewed: 02/21/2008 [facility] Patient Information 2014 [place], [place].       I personally performed the services described in  this documentation, which was scribed in my presence. The recorded information has been reviewed and considered, and addended by me as needed.

## 2013-10-15 LAB — TSH: TSH: 1.36 u[IU]/mL (ref 0.450–4.500)

## 2013-10-16 ENCOUNTER — Encounter: Payer: Self-pay | Admitting: Radiology

## 2013-11-29 ENCOUNTER — Ambulatory Visit (HOSPITAL_COMMUNITY)
Admission: AD | Admit: 2013-11-29 | Discharge: 2013-11-29 | Disposition: A | Discharge status: Home / Self Care | Payer: 59 | Attending: Psychiatry | Admitting: Psychiatry

## 2013-11-30 ENCOUNTER — Emergency Department (HOSPITAL_COMMUNITY)
Admission: EM | Admit: 2013-11-30 | Discharge: 2013-11-30 | Discharge status: Against Medical Advice | Payer: Self-pay | Attending: Emergency Medicine | Admitting: Emergency Medicine

## 2013-11-30 ENCOUNTER — Encounter (HOSPITAL_COMMUNITY): Payer: Self-pay | Admitting: Emergency Medicine

## 2013-11-30 ENCOUNTER — Encounter (HOSPITAL_COMMUNITY): Payer: Self-pay | Admitting: Licensed Clinical Social Worker

## 2013-11-30 DIAGNOSIS — J45909 Unspecified asthma, uncomplicated: Secondary | ICD-10-CM | POA: Insufficient documentation

## 2013-11-30 DIAGNOSIS — Z008 Encounter for other general examination: Secondary | ICD-10-CM | POA: Insufficient documentation

## 2013-11-30 DIAGNOSIS — Z3202 Encounter for pregnancy test, result negative: Secondary | ICD-10-CM | POA: Insufficient documentation

## 2013-11-30 LAB — COMPREHENSIVE METABOLIC PANEL
ALT: 28 U/L (ref 0–35)
AST: 23 U/L (ref 0–37)
Albumin: 4.1 g/dL (ref 3.5–5.2)
Alkaline Phosphatase: 71 U/L (ref 39–117)
BUN: 11 mg/dL (ref 6–23)
CALCIUM: 9.6 mg/dL (ref 8.4–10.5)
CO2: 25 meq/L (ref 19–32)
CREATININE: 0.75 mg/dL (ref 0.50–1.10)
Chloride: 100 mEq/L (ref 96–112)
GFR calc Af Amer: >90 mL/min (ref >90)
GFR calc non Af Amer: >90 mL/min (ref >90)
Glucose, Bld: 86 mg/dL (ref 70–99)
Potassium: 3.4 mEq/L — ABNORMAL LOW (ref 3.7–5.3)
Sodium: 141 mEq/L (ref 137–147)
Total Bilirubin: 0.4 mg/dL (ref 0.3–1.2)
Total Protein: 8 g/dL (ref 6.0–8.3)

## 2013-11-30 LAB — RAPID URINE DRUG SCREEN, HOSP PERFORMED
Amphetamines: NOT DETECTED
Barbiturates: NOT DETECTED
Benzodiazepines: NOT DETECTED
Cocaine: NOT DETECTED
OPIATES: NOT DETECTED
TETRAHYDROCANNABINOL: POSITIVE — AB

## 2013-11-30 LAB — CBC
HEMATOCRIT: 41 % (ref 36.0–46.0)
HEMOGLOBIN: 13.8 g/dL (ref 12.0–15.0)
MCH: 27 pg (ref 26.0–34.0)
MCHC: 33.7 g/dL (ref 30.0–36.0)
MCV: 80.2 fL (ref 78.0–100.0)
Platelets: 290 10*3/uL (ref 150–400)
RBC: 5.11 MIL/uL (ref 3.87–5.11)
RDW: 14.9 % (ref 11.5–15.5)
WBC: 9.4 10*3/uL (ref 4.0–10.5)

## 2013-11-30 LAB — ACETAMINOPHEN LEVEL: Acetaminophen (Tylenol), Serum: <15 ug/mL (ref 10–30)

## 2013-11-30 LAB — SALICYLATE LEVEL: Salicylate Lvl: <2 mg/dL — ABNORMAL LOW (ref 2.8–20.0)

## 2013-11-30 LAB — ETHANOL: Alcohol, Ethyl (B): <11 mg/dL (ref 0–11)

## 2013-11-30 LAB — POC URINE PREG, ED: PREG TEST UR: NEGATIVE

## 2013-11-30 NOTE — ED Notes (Signed)
Pt states she is uable to stay, states she has meds at home she can take. Pt called boyfriend to pick her up.

## 2013-11-30 NOTE — ED Notes (Signed)
[facility] pt presents with rambling speech, pt states she is staying at the [facility] at this time d/t mold in her apartment. Pt states her family is causing her to be stressed and lose sleep because of constant nagging. Pt states she has been off her meds x 2 months. Pt asking for Klonopin or Ambien to help her sleep.
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
## 2013-12-01 ENCOUNTER — Emergency Department (HOSPITAL_COMMUNITY)
Admission: EM | Admit: 2013-12-01 | Discharge: 2013-12-01 | Disposition: A | Discharge status: Home / Self Care | Payer: 59 | Attending: Emergency Medicine | Admitting: Emergency Medicine

## 2013-12-01 ENCOUNTER — Encounter (HOSPITAL_COMMUNITY): Payer: Self-pay | Admitting: Emergency Medicine

## 2013-12-01 DIAGNOSIS — Z79899 Other long term (current) drug therapy: Secondary | ICD-10-CM | POA: Insufficient documentation

## 2013-12-01 DIAGNOSIS — M797 Fibromyalgia: Secondary | ICD-10-CM

## 2013-12-01 DIAGNOSIS — Z87891 Personal history of nicotine dependence: Secondary | ICD-10-CM | POA: Insufficient documentation

## 2013-12-01 DIAGNOSIS — M549 Dorsalgia, unspecified: Secondary | ICD-10-CM

## 2013-12-01 DIAGNOSIS — F909 Attention-deficit hyperactivity disorder, unspecified type: Secondary | ICD-10-CM

## 2013-12-01 DIAGNOSIS — F3289 Other specified depressive episodes: Secondary | ICD-10-CM | POA: Insufficient documentation

## 2013-12-01 DIAGNOSIS — Z872 Personal history of diseases of the skin and subcutaneous tissue: Secondary | ICD-10-CM | POA: Insufficient documentation

## 2013-12-01 DIAGNOSIS — F22 Delusional disorders: Secondary | ICD-10-CM | POA: Insufficient documentation

## 2013-12-01 DIAGNOSIS — F329 Major depressive disorder, single episode, unspecified: Secondary | ICD-10-CM

## 2013-12-01 DIAGNOSIS — F32A Depression, unspecified: Secondary | ICD-10-CM

## 2013-12-01 DIAGNOSIS — F4310 Post-traumatic stress disorder, unspecified: Secondary | ICD-10-CM | POA: Diagnosis present

## 2013-12-01 DIAGNOSIS — Z87828 Personal history of other (healed) physical injury and trauma: Secondary | ICD-10-CM | POA: Insufficient documentation

## 2013-12-01 DIAGNOSIS — J45909 Unspecified asthma, uncomplicated: Secondary | ICD-10-CM | POA: Insufficient documentation

## 2013-12-01 DIAGNOSIS — F319 Bipolar disorder, unspecified: Secondary | ICD-10-CM

## 2013-12-01 DIAGNOSIS — G4700 Insomnia, unspecified: Secondary | ICD-10-CM | POA: Insufficient documentation

## 2013-12-01 DIAGNOSIS — F29 Unspecified psychosis not due to a substance or known physiological condition: Secondary | ICD-10-CM

## 2013-12-01 DIAGNOSIS — F419 Anxiety disorder, unspecified: Secondary | ICD-10-CM

## 2013-12-01 DIAGNOSIS — F411 Generalized anxiety disorder: Secondary | ICD-10-CM | POA: Insufficient documentation

## 2013-12-01 DIAGNOSIS — F332 Major depressive disorder, recurrent severe without psychotic features: Secondary | ICD-10-CM | POA: Diagnosis present

## 2013-12-01 LAB — RAPID URINE DRUG SCREEN, HOSP PERFORMED
AMPHETAMINES: NOT DETECTED
Barbiturates: NOT DETECTED
Benzodiazepines: NOT DETECTED
Cocaine: NOT DETECTED
Opiates: NOT DETECTED
TETRAHYDROCANNABINOL: POSITIVE — AB

## 2013-12-01 MED ORDER — ONDANSETRON HCL 4 MG PO TABS: 4.0000 mg | ORAL_TABLET | Freq: Three times a day (TID) | ORAL | Status: DC | PRN

## 2013-12-01 MED ORDER — PRAZOSIN HCL 1 MG PO CAPS: 1.0000 mg | ORAL_CAPSULE | Freq: Two times a day (BID) | ORAL | Status: DC

## 2013-12-01 MED ORDER — CLONAZEPAM 0.5 MG PO TABS: 0.5000 mg | ORAL_TABLET | Freq: Every evening | ORAL | Status: DC | PRN

## 2013-12-01 MED ORDER — ALUM & MAG HYDROXIDE-SIMETH 200-200-20 MG/5ML PO SUSP
30.0000 mL | ORAL | Status: DC | PRN
  Administered 2013-12-01: 30 mL via ORAL
  Filled 2013-12-01: qty 30

## 2013-12-01 MED ORDER — LITHIUM CARBONATE ER 450 MG PO TBCR: 450.0000 mg | EXTENDED_RELEASE_TABLET | Freq: Two times a day (BID) | ORAL | Status: DC

## 2013-12-01 MED ORDER — ZOLPIDEM TARTRATE 5 MG PO TABS: 5.0000 mg | ORAL_TABLET | Freq: Every evening | ORAL | Status: DC | PRN

## 2013-12-01 MED ORDER — CITALOPRAM HYDROBROMIDE 10 MG PO TABS: 10.0000 mg | ORAL_TABLET | Freq: Every day | ORAL | Status: DC

## 2013-12-01 MED ORDER — IBUPROFEN 200 MG PO TABS: 600.0000 mg | ORAL_TABLET | Freq: Three times a day (TID) | ORAL | Status: DC | PRN

## 2013-12-01 MED ORDER — LORAZEPAM 1 MG PO TABS
1.0000 mg | ORAL_TABLET | Freq: Three times a day (TID) | ORAL | Status: DC | PRN
Start: 2013-12-01 — End: 2013-12-01
  Administered 2013-12-01: 1 mg via ORAL
  Filled 2013-12-01: qty 1

## 2013-12-01 MED ORDER — NICOTINE 21 MG/24HR TD PT24: 21.0000 mg | MEDICATED_PATCH | Freq: Every day | TRANSDERMAL | Status: DC

## 2013-12-01 NOTE — ED Notes (Signed)
Written dc instructions reviewed w/ patient.  Pt encouraged to follow up with her doctor as instructed, take medications as directed, drink fluids, and eat regular meals .  Pt verbalized understanding.  Pt ambulatory to dc w/ mHt, belongings returned after leaving the unit.

## 2013-12-01 NOTE — ED Notes (Signed)
Up to the bathroom 

## 2013-12-01 NOTE — ED Notes (Addendum)
Pt reports that she felt dizzy when she stood up, ortho's as recorded, PO fluids encouraged.  Pt encouraged to eat lunch and move slowly. EDP notified-push fluids and continue to monitor

## 2013-12-01 NOTE — Consult Note (Signed)
[facility]   Reason for Consult:  Hallucinations Referring Physician:  EDP  Morgan ProfferJenna A [name] is an 23 y.o. female. Total Time spent with patient: 45 minutes  Assessment: AXIS I:  Psychotic Disorder NOS AXIS II:  Deferred AXIS III:   Past Medical History  Diagnosis Date  . Eczema   . TMJ (dislocation of temporomandibular joint)   . Asthma   . Anxiety   . Depression   . ADHD (attention deficit hyperactivity disorder)   . PTSD (post-traumatic stress disorder)    AXIS IV:  other psychosocial or environmental problems AXIS V:  61-70 mild symptoms  Plan:  No evidence of imminent risk to self or others at present.   Patient does not meet criteria for psychiatric inpatient admission. Supportive therapy provided about ongoing stressors. Discussed crisis plan, support from social network, calling 911, coming to the Emergency Department, and calling Suicide Hotline.  Subjective:   Morgan [name] is a 23 y.o. female patient.  HPI:  Patient states "I didn't have any medications and I need my Klonopin and Abient to help me sleep.  I need my Cymbalta and Lithium. I ran out of med's in March and I don't have the money to see Dr. Erle CrockerLongstien or Dr. Jannifer FranklinAkintayo.  No I am not depressed.  No I'm not hearing voices; only when I don't sleep.  I'm suppose to be going on vacation and wanted to make sure I was healthy enough to go and to get my medications.   Discussed with patient that emergency department should not be use for medication refills; discussed call doctor office for refills.    HPI Elements:   Location:  Ran out of medication. Quality:  Need refills on medicine. Severity:  Needs refills on medicine. Timing:  1 month.  Past Psychiatric History: Past Medical History  Diagnosis Date  . Eczema   . TMJ (dislocation of temporomandibular joint)   . Asthma   . Anxiety   . Depression   . ADHD (attention deficit hyperactivity disorder)   . PTSD (post-traumatic stress  disorder)     reports that she has quit smoking. Her smoking use included Cigarettes. She smoked 1.00 pack per day. She has never used smokeless tobacco. She reports that she drinks alcohol. She reports that she uses illicit drugs (Marijuana). Family History  Problem Relation Age of Onset  . Depression Mother   . Hypertension Mother   . Hypertension Maternal Grandmother   . Hypertension Maternal Grandfather   . Diabetes Maternal Grandfather   . Hypertension Paternal Grandmother   . Diabetes Paternal Grandmother   . Heart murmur Mother            Allergies:   Allergies  Allergen Reactions  . Food Anaphylaxis    Melons: any type in the melon family with cause anaphylaxis   . Peanut-Containing Drug Products Anaphylaxis  . Geodon [Ziprasidone Hcl] Other (See Comments)    Hypersomnolence   . Lactose Intolerance (Gi) Other (See Comments)    Reaction: GI upset  . Soy Allergy   . Vitamin C Itching    Most food which have vitamin c causes itching  . Sulfa Antibiotics Rash    Childhood allergy    ACT Assessment Complete:  Yes:    Educational Status    Risk to Self: Risk to self Is patient at risk for suicide?: No Substance abuse history and/or treatment for substance abuse?: No  Risk to Others:    Abuse:  Prior Inpatient Therapy:    Prior Outpatient Therapy:    Additional Information:                    Objective: Blood pressure 125/76, pulse 82, temperature 97.9 F (36.6 C), temperature source Oral, resp. rate 18, height 5' 5.5" (1.664 m), weight 103.137 kg (227 lb 6 oz), last menstrual period 11/27/2013, SpO2 99.00%.Body mass index is 37.25 kg/(m^2). Results for orders placed during the hospital encounter of 12/01/13 (from the past 72 hour(s))  URINE RAPID DRUG SCREEN (HOSP PERFORMED)     Status: Abnormal   Collection Time    12/01/13  9:57 AM      Result Value Ref Range   Opiates NONE DETECTED  NONE DETECTED   Cocaine NONE DETECTED  NONE DETECTED    Benzodiazepines NONE DETECTED  NONE DETECTED   Amphetamines NONE DETECTED  NONE DETECTED   Tetrahydrocannabinol POSITIVE (*) NONE DETECTED   Barbiturates NONE DETECTED  NONE DETECTED   Comment:            DRUG SCREEN FOR MEDICAL PURPOSES     ONLY.  IF CONFIRMATION IS NEEDED     FOR ANY PURPOSE, NOTIFY LAB     WITHIN 5 DAYS.                LOWEST DETECTABLE LIMITS     FOR URINE DRUG SCREEN     Drug Class       Cutoff (ng/mL)     Amphetamine      1000     Barbiturate      200     Benzodiazepine   200     Tricyclics       300     Opiates          300     Cocaine          300     THC              50   Labs are reviewed and are pertinent for no critical values.  Current Facility-Administered Medications  Medication Dose Route Frequency Provider Last Rate Last Dose  . alum & mag hydroxide-simeth (MAALOX/MYLANTA) 200-200-20 MG/5ML suspension 30 mL  30 mL Oral PRN Arie Sabina Schinlever, PA-C      . ibuprofen (ADVIL,MOTRIN) tablet 600 mg  600 mg Oral Q8H PRN Otilio Miu, PA-C      . LORazepam (ATIVAN) tablet 1 mg  1 mg Oral Q8H PRN Arie Sabina Schinlever, PA-C   1 mg at 12/01/13 0805  . nicotine (NICODERM CQ - dosed in mg/24 hours) patch 21 mg  21 mg Transdermal Daily Catherine E Schinlever, PA-C      . ondansetron (ZOFRAN) tablet 4 mg  4 mg Oral Q8H PRN Otilio Miu, PA-C       Current Outpatient Prescriptions  Medication Sig Dispense Refill  . citalopram (CELEXA) 10 MG tablet Take 10 mg by mouth daily.      . clonazePAM (KLONOPIN) 0.5 MG tablet Take 0.5 mg by mouth at bedtime as needed (sleep).       Marland Kitchen lithium carbonate (ESKALITH) 450 MG CR tablet Take 450 mg by mouth 2 (two) times daily.      . montelukast (SINGULAIR) 10 MG tablet Take 1 tablet (10 mg total) by mouth at bedtime. For asthma      . prazosin (MINIPRESS) 1 MG capsule Take 1 mg by mouth 2 (two) times daily.      Marland Kitchen  zolpidem (AMBIEN) 5 MG tablet Take 5 mg by mouth at bedtime as needed for sleep.         Psychiatric Specialty Exam:     Blood pressure 125/76, pulse 82, temperature 97.9 F (36.6 C), temperature source Oral, resp. rate 18, height 5' 5.5" (1.664 m), weight 103.137 kg (227 lb 6 oz), last menstrual period 11/27/2013, SpO2 99.00%.Body mass index is 37.25 kg/(m^2).  General Appearance: Casual and Disheveled  Eye Contact::  Good  Speech:  Clear and Coherent and Normal Rate  Volume:  Normal  Mood:  Depressed  Affect:  Appropriate and Congruent  Thought Process:  Circumstantial, Coherent and Goal Directed  Orientation:  Full (Time, Place, and Person)  Thought Content:  Rumination  Suicidal Thoughts:  No  Homicidal Thoughts:  No  Memory:  Immediate;   Good Recent;   Good Remote;   Good  Judgement:  Fair  Insight:  Good  Psychomotor Activity:  Normal  Concentration:  Fair  Recall:  Good  Fund of Knowledge:Good  Language: Good  Akathisia:  No  Handed:  Right  AIMS (if indicated):     Assets:  Communication Skills Desire for Improvement Housing Social Support  Sleep:      Musculoskeletal: Strength & Muscle Tone: within normal limits Gait & Station: normal Patient leans: N/A  Treatment Plan Summary: Follow up with outpatient provider.  Referrals for assistance  Disposition:  Discharge home. Patient to follow up with primary outpatient provider.  Referral to be given to patient that will help with medications.    Discharge Assessment     Demographic Factors:  Female  Total Time spent with patient: 15 minutes  Psychiatric Specialty Exam: Same as above  Musculoskeletal: Same as above   Mental Status Per Nursing Assessment::   On Admission:     Current Mental Status by Physician: Patient denies suicidal/homicidal ideation, psychosis, paranoia  Loss Factors: NA  Historical Factors: NA  Risk Reduction Factors:   Positive social support  Continued Clinical Symptoms:  Previous Psychiatric Diagnoses and Treatments  Cognitive Features That  Contribute To Risk:  Thought constriction (tunnel vision)    Suicide Risk:  Minimal: No identifiable suicidal ideation.  Patients presenting with no risk factors but with morbid ruminations; may be classified as minimal risk based on the severity of the depressive symptoms  Discharge Diagnoses:  Same as above  Plan Of Care/Follow-up recommendations:  Activity:  Resume usual activity Diet:  Resume usual diet  Is patient on multiple antipsychotic therapies at discharge:  No   Has Patient had three or more failed trials of antipsychotic monotherapy by history:  No  Recommended Plan for Multiple Antipsychotic Therapies: NA   Shuvon Rankin, FNP-BC 12/01/2013 1:02 PM  Patient was seen face to face for psychiatric evaluation, safety assessment and case discussed with physician extender and formulated treatment plan.Reviewed the information documented and agree with the treatment plan.  Randal BubaJanardhaha R Nadim Malia 12/01/2013 1:38 PM

## 2013-12-01 NOTE — ED Notes (Signed)
Report called to OceanaJanie, RN pt to move to room 36

## 2013-12-01 NOTE — Discharge Instructions (Signed)
Hallucinations and Delusions You seem to be having hallucinations and/or delusions. You may be hearing voices that no one else can hear. This can seem very real to you. You may be having thoughts and fears that do not make sense to others. This condition can be due to mental disease like schizophrenia. It may be caused by a medical condition, such as an infection or electrolyte disturbance. These symptoms are also seen in drug abusers, especially those who use crack cocaine and amphetamines. Drugs like PCP, LSD, MDMA, peyote, and psilocybin can also cause frightening hallucinations and loss of control. If your symptoms are due to drug abuse, your mental state should improve as the drug(s) leave your system. Someone you trust should be with you until you are better to protect you and calm your fears. Often tranquilizers are very helpful at controlling hallucinations, anxiety, and destructive behavior. Getting a proper diet and enough sleep is important to recovery. If your symptoms are not due to drugs, or do not improve over several days after stopping drug use, you need further medical or mental health care. SEEK IMMEDIATE MEDICAL CARE IF:   Your symptoms get worse, especially if you think your life is in danger  You have violent or destructive thoughts. Recovery is possible, but you have to get proper treatment and avoid drugs that are known to cause you trouble. Document Released: 08/25/2004 Document Revised: 10/10/2011 Document Reviewed: 07/18/2005 [facility] Patient Information 2014 [place].  Insomnia Insomnia is frequent trouble falling and/or staying asleep. Insomnia can be a long term problem or a short term problem. Both are common. Insomnia can be a short term problem when the wakefulness is related to a certain stress or worry. Long term insomnia is often related to ongoing stress during waking hours and/or poor sleeping habits. Overtime, sleep deprivation itself can make the problem  worse. Every little thing feels more severe because you are overtired and your ability to cope is decreased. CAUSES   Stress, anxiety, and depression.  Poor sleeping habits.  Distractions such as TV in the bedroom.  Naps close to bedtime.  Engaging in emotionally charged conversations before bed.  Technical reading before sleep.  Alcohol and other sedatives. They may make the problem worse. They can hurt normal sleep patterns and normal dream activity.  Stimulants such as caffeine for several hours prior to bedtime.  Pain syndromes and shortness of breath can cause insomnia.  Exercise late at night.  Changing time zones may cause sleeping problems (jet lag). It is sometimes helpful to have someone observe your sleeping patterns. They should look for periods of not breathing during the night (sleep apnea). They should also look to see how long those periods last. If you live alone or observers are uncertain, you can also be observed at a sleep clinic where your sleep patterns will be professionally monitored. Sleep apnea requires a checkup and treatment. Give your caregivers your medical history. Give your caregivers observations your family has made about your sleep.  SYMPTOMS   Not feeling rested in the morning.  Anxiety and restlessness at bedtime.  Difficulty falling and staying asleep. TREATMENT   Your caregiver may prescribe treatment for an underlying medical disorders. Your caregiver can give advice or help if you are using alcohol or other drugs for self-medication. Treatment of underlying problems will usually eliminate insomnia problems.  Medications can be prescribed for short time use. They are generally not recommended for lengthy use.  Over-the-counter sleep medicines are not recommended for  lengthy use. They can be habit forming.  You can promote easier sleeping by making lifestyle changes such as:  Using relaxation techniques that help with breathing and  reduce muscle tension.  Exercising earlier in the day.  Changing your diet and the time of your last meal. No night time snacks.  Establish a regular time to go to bed.  Counseling can help with stressful problems and worry.  Soothing music and white noise may be helpful if there are background noises you cannot remove.  Stop tedious detailed work at least one hour before bedtime. HOME CARE INSTRUCTIONS   Keep a diary. Inform your caregiver about your progress. This includes any medication side effects. See your caregiver regularly. Take note of:  Times when you are asleep.  Times when you are awake during the night.  The quality of your sleep.  How you feel the next day. This information will help your caregiver care for you.  Get out of bed if you are still awake after 15 minutes. Read or do some quiet activity. Keep the lights down. Wait until you feel sleepy and go back to bed.  Keep regular sleeping and waking hours. Avoid naps.  Exercise regularly.  Avoid distractions at bedtime. Distractions include watching television or engaging in any intense or detailed activity like attempting to balance the household checkbook.  Develop a bedtime ritual. Keep a familiar routine of bathing, brushing your teeth, climbing into bed at the same time each night, listening to soothing music. Routines increase the success of falling to sleep faster.  Use relaxation techniques. This can be using breathing and muscle tension release routines. It can also include visualizing peaceful scenes. You can also help control troubling or intruding thoughts by keeping your mind occupied with boring or repetitive thoughts like the old concept of counting sheep. You can make it more creative like imagining planting one beautiful flower after another in your backyard garden.  During your day, work to eliminate stress. When this is not possible use some of the previous suggestions to help reduce the  anxiety that accompanies stressful situations. MAKE SURE YOU:   Understand these instructions.  Will watch your condition.  Will get help right away if you are not doing well or get worse. Document Released: 07/15/2000 Document Revised: 10/10/2011 Document Reviewed: 08/15/2007 [facility] Patient Information 2014 [place].  Insomnia Insomnia is frequent trouble falling and/or staying asleep. Insomnia can be a long term problem or a short term problem. Both are common. Insomnia can be a short term problem when the wakefulness is related to a certain stress or worry. Long term insomnia is often related to ongoing stress during waking hours and/or poor sleeping habits. Overtime, sleep deprivation itself can make the problem worse. Every little thing feels more severe because you are overtired and your ability to cope is decreased. CAUSES   Stress, anxiety, and depression.  Poor sleeping habits.  Distractions such as TV in the bedroom.  Naps close to bedtime.  Engaging in emotionally charged conversations before bed.  Technical reading before sleep.  Alcohol and other sedatives. They may make the problem worse. They can hurt normal sleep patterns and normal dream activity.  Stimulants such as caffeine for several hours prior to bedtime.  Pain syndromes and shortness of breath can cause insomnia.  Exercise late at night.  Changing time zones may cause sleeping problems (jet lag). It is sometimes helpful to have someone observe your sleeping patterns. They should look for periods  of not breathing during the night (sleep apnea). They should also look to see how long those periods last. If you live alone or observers are uncertain, you can also be observed at a sleep clinic where your sleep patterns will be professionally monitored. Sleep apnea requires a checkup and treatment. Give your caregivers your medical history. Give your caregivers observations your family has made about your  sleep.  SYMPTOMS   Not feeling rested in the morning.  Anxiety and restlessness at bedtime.  Difficulty falling and staying asleep. TREATMENT   Your caregiver may prescribe treatment for an underlying medical disorders. Your caregiver can give advice or help if you are using alcohol or other drugs for self-medication. Treatment of underlying problems will usually eliminate insomnia problems.  Medications can be prescribed for short time use. They are generally not recommended for lengthy use.  Over-the-counter sleep medicines are not recommended for lengthy use. They can be habit forming.  You can promote easier sleeping by making lifestyle changes such as:  Using relaxation techniques that help with breathing and reduce muscle tension.  Exercising earlier in the day.  Changing your diet and the time of your last meal. No night time snacks.  Establish a regular time to go to bed.  Counseling can help with stressful problems and worry.  Soothing music and white noise may be helpful if there are background noises you cannot remove.  Stop tedious detailed work at least one hour before bedtime. HOME CARE INSTRUCTIONS   Keep a diary. Inform your caregiver about your progress. This includes any medication side effects. See your caregiver regularly. Take note of:  Times when you are asleep.  Times when you are awake during the night.  The quality of your sleep.  How you feel the next day. This information will help your caregiver care for you.  Get out of bed if you are still awake after 15 minutes. Read or do some quiet activity. Keep the lights down. Wait until you feel sleepy and go back to bed.  Keep regular sleeping and waking hours. Avoid naps.  Exercise regularly.  Avoid distractions at bedtime. Distractions include watching television or engaging in any intense or detailed activity like attempting to balance the household checkbook.  Develop a bedtime ritual. Keep  a familiar routine of bathing, brushing your teeth, climbing into bed at the same time each night, listening to soothing music. Routines increase the success of falling to sleep faster.  Use relaxation techniques. This can be using breathing and muscle tension release routines. It can also include visualizing peaceful scenes. You can also help control troubling or intruding thoughts by keeping your mind occupied with boring or repetitive thoughts like the old concept of counting sheep. You can make it more creative like imagining planting one beautiful flower after another in your backyard garden.  During your day, work to eliminate stress. When this is not possible use some of the previous suggestions to help reduce the anxiety that accompanies stressful situations. MAKE SURE YOU:   Understand these instructions.  Will watch your condition.  Will get help right away if you are not doing well or get worse. Document Released: 07/15/2000 Document Revised: 10/10/2011 Document Reviewed: 08/15/2007 [facility] Patient Information 2014 [place].  Major Depressive Disorder Major depressive disorder (MDD) is a mental illness. It also may be called clinical depression or unipolar depression. MDD usually causes feelings of sadness, hopelessness, or helplessness. Some people with MDD do not feel particularly sad but  lose interest in doing things they used to enjoy (anhedonia). MDD also can cause physical symptoms. It can interfere with work, school, relationships, and other normal everyday activities. MDD varies in severity but is longer lasting and more serious than the sadness we all feel from time to time in our lives. MDD often is triggered by stressful life events or major life changes. Examples of these triggers include divorce, loss of your job or home, a move, and the death of a family member or close friend. Sometimes MDD occurs for no obvious reason at all. People who have family members with MDD  or bipolar disorder are at higher risk for developing MDD, with or without life stressors. MDD can occur at any age. It may occur just once in your life (single episode MDD). It may occur multiple times (recurrent MDD). SYMPTOMS People with MDD have either anhedonia or depressed mood on nearly a daily basis for at least 2 weeks or longer. Symptoms of depressed mood include:  Feelings of sadness (blue or down in the dumps) or emptiness.  Feelings of hopelessness or helplessness.  Tearfulness or episodes of crying (may be observed by others).  Irritability (children and adolescents). In addition to depressed mood or anhedonia or both, people with MDD have at least four of the following symptoms:  Difficulty sleeping or sleeping too much.   Significant change (increase or decrease) in appetite or weight.   Lack of energy or motivation.  Feelings of guilt and worthlessness.   Difficulty concentrating, remembering, or making decisions.  Unusually slow movement (psychomotor retardation) or restlessness (as observed by others).   Recurrent wishes for death, recurrent thoughts of self-harm (suicide), or a suicide attempt. People with MDD commonly have persistent negative thoughts about themselves, other people, and the world. People with severe MDD may experiencedistorted beliefs or perceptions about the world (psychotic delusions). They also may see or hear things that are not real (psychotic hallucinations). DIAGNOSIS MDD is diagnosed through an assessment by your caregiver. Your caregiver will ask aboutaspects of your daily life, such as mood,sleep, and appetite, to see if you have the diagnostic symptoms of MDD. Your caregiver may ask about your medical history and use of alcohol or drugs, including prescription medications. Your caregiver also may do a physical exam and blood work. This is because certain medical conditions and the use of certain substances can cause MDD-like symptoms  (secondary depression). Your caregiver also may refer you to a mental health specialist for further evaluation and treatment. TREATMENT It is important to recognize the symptoms of MDD and seek treatment. The following treatments can be prescribed for MDD:   Medication Antidepressant medications usually are prescribed. Antidepressant medications are thought to correct chemical imbalances in the brain that are commonly associated with MDD. Other types of medication may be added if MDD symptoms do not respond to antidepressant medications alone or if psychotic delusions or hallucinations occur.  Talk therapy Talk therapy can be helpful in treating MDD by providing support, education, and guidance. Certain types of talk therapy also can help with negative thinking (cognitive behavioral therapy) and with relationship issues that trigger MDD (interpersonal therapy). A mental health specialist can help determine which treatment is best for you. Most people with MDD do well with a combination of medication and talk therapy. Treatments involving electrical stimulation of the brain can be used in situations with extremely severe symptoms or when medication and talk therapy do not work over time. These treatments include electroconvulsive therapy, transcranial  magnetic stimulation, and vagal nerve stimulation. Document Released: 11/12/2012 Document Reviewed: 11/12/2012 [facility] Patient Information 2014 [place], [place].  Mood Disorders Mood disorders are conditions that affect the way a person feels emotionally. The main mood disorders include:  Depression.  Bipolar disorder.  Dysthymia. Dysthymia is a mild, lasting (chronic) depression. Symptoms of dysthymia are similar to depression, but not as severe.  Cyclothymia. Cyclothymia includes mood swings, but the highs and lows are not as severe as they are in bipolar disorder. Symptoms of cyclothymia are similar to those of bipolar disorder, but less  extreme. CAUSES  Mood disorders are probably caused by a combination of factors. People with mood disorders seem to have physical and chemical changes in their brains. Mood disorders run in families, so there may be genetic causes. Severe trauma or stressful life events may also increase the risk of mood disorders.  SYMPTOMS  Symptoms of mood disorders depend on the specific type of condition. Depression symptoms include:  Feeling sad, worthless, or hopeless.  Negative thoughts.  Inability to enjoy one's usual activities.  Low energy.  Sleeping too much or too little.  Appetite changes.  Crying.  Concentration problems.  Thoughts of harming oneself. Bipolar disorder symptoms include:  Periods of depression (see above symptoms).  Mood swings, from sadness and depression, to abnormal elation and excitement.  Periods of mania:  Racing thoughts.  Fast speech.  Poor judgment, and careless, dangerous choices.  Decreased need for sleep.  Risky behavior.  Difficulty concentrating.  Irritability.  Increased energy.  Increased sex drive. DIAGNOSIS  There are no blood tests or X-rays that can confirm a mood disorder. However, your caregiver may choose to run some tests to make sure that there is not another physical cause for your symptoms. A mood disorder is usually diagnosed after an in-depth interview with a caregiver. TREATMENT  Mood disorders can be treated with one or more of the following:  Medicine. This may include antidepressants, mood-stabilizers, or anti-psychotics.  Psychotherapy (talk therapy).  Cognitive behavioral therapy. You are taught to recognize negative thoughts and behavior patterns, and replace them with healthy thoughts and behaviors.  Electroconvulsive therapy. For very severe cases of deep depression, a series of treatments in which an electrical current is applied to the brain.  Vagus nerve stimulation. A pulse of electricity is applied  to a portion of the brain.  Transcranial magnetic stimulation. Powerful magnets are placed on the head that produce electrical currents.  Hospitalization. In severe situations, or when someone is having serious thoughts of harming him or herself, hospitalization may be necessary in order to keep the person safe. This is also done to quickly start and monitor treatment. HOME CARE INSTRUCTIONS   Take your medicine exactly as directed.  Attend all of your therapy sessions.  Try to eat regular, healthy meals.  Exercise daily. Exercise may improve mood symptoms.  Get good sleep.  Do not drink alcohol or use pot or other drugs. These can worsen mood symptoms and cause anxiety and psychosis.  Tell your caregiver if you develop any side effects, such as feeling sick to your stomach (nauseous), dry mouth, dizziness, constipation, drowsiness, tremor, weight gain, or sexual symptoms. He or she may suggest things you can do to improve symptoms.  Learn ways to cope with the stress of having a chronic illness. This includes yoga, meditation, tai chi, or participating in a support group.  Drink enough water to keep your urine clear or pale yellow. Eat a high-fiber diet. These habits  may help you avoid constipation from your medicine. SEEK IMMEDIATE MEDICAL CARE IF:  Your mood worsens.  You have thoughts of hurting yourself or others.  You cannot care for yourself.  You develop the sensation of hearing or seeing something that is not actually present (auditory or visual hallucinations).  You develop abnormal thoughts. Document Released: 05/15/2009 Document Revised: 10/10/2011 Document Reviewed: 05/15/2009 [facility] Patient Information 2014 [place], [place].

## 2013-12-01 NOTE — ED Notes (Signed)
Dr J and Shuvon NP into see 

## 2013-12-01 NOTE — ED Notes (Signed)
sleeping
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
## 2013-12-01 NOTE — ED Notes (Signed)
EDP updated w/ repeat ortho's-OK to dc
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
## 2013-12-01 NOTE — ED Notes (Signed)
Pt ambulatory to unit, up to the desk requesting klonipin

## 2013-12-01 NOTE — ED Notes (Signed)
Up in the hall walking around, reports relief from stomach pain w/ maalox

## 2013-12-01 NOTE — ED Provider Notes (Signed)
Medical screening examination/treatment/procedure(s) were performed by non-physician practitioner and as supervising physician I was immediately available for consultation/collaboration.   EKG Interpretation None       Sunnie NielsenBrian Omni Dunsworth, MD 12/01/13 2253

## 2013-12-31 ENCOUNTER — Encounter (HOSPITAL_COMMUNITY): Payer: Self-pay | Admitting: Emergency Medicine

## 2013-12-31 ENCOUNTER — Emergency Department (HOSPITAL_COMMUNITY)
Admission: EM | Admit: 2013-12-31 | Discharge: 2013-12-31 | Disposition: A | Discharge status: Home / Self Care | Payer: Self-pay | Attending: Emergency Medicine | Admitting: Emergency Medicine

## 2013-12-31 ENCOUNTER — Emergency Department (HOSPITAL_COMMUNITY): Payer: Self-pay

## 2013-12-31 DIAGNOSIS — R000 Tachycardia, unspecified: Secondary | ICD-10-CM | POA: Insufficient documentation

## 2013-12-31 DIAGNOSIS — F909 Attention-deficit hyperactivity disorder, unspecified type: Secondary | ICD-10-CM | POA: Insufficient documentation

## 2013-12-31 DIAGNOSIS — Z3202 Encounter for pregnancy test, result negative: Secondary | ICD-10-CM | POA: Insufficient documentation

## 2013-12-31 DIAGNOSIS — G478 Other sleep disorders: Secondary | ICD-10-CM | POA: Insufficient documentation

## 2013-12-31 DIAGNOSIS — Z872 Personal history of diseases of the skin and subcutaneous tissue: Secondary | ICD-10-CM | POA: Insufficient documentation

## 2013-12-31 DIAGNOSIS — J45901 Unspecified asthma with (acute) exacerbation: Secondary | ICD-10-CM | POA: Insufficient documentation

## 2013-12-31 DIAGNOSIS — F419 Anxiety disorder, unspecified: Secondary | ICD-10-CM

## 2013-12-31 DIAGNOSIS — Z79899 Other long term (current) drug therapy: Secondary | ICD-10-CM | POA: Insufficient documentation

## 2013-12-31 DIAGNOSIS — R112 Nausea with vomiting, unspecified: Secondary | ICD-10-CM | POA: Insufficient documentation

## 2013-12-31 DIAGNOSIS — F410 Panic disorder [episodic paroxysmal anxiety] without agoraphobia: Secondary | ICD-10-CM | POA: Insufficient documentation

## 2013-12-31 DIAGNOSIS — F329 Major depressive disorder, single episode, unspecified: Secondary | ICD-10-CM | POA: Insufficient documentation

## 2013-12-31 DIAGNOSIS — F4310 Post-traumatic stress disorder, unspecified: Secondary | ICD-10-CM | POA: Insufficient documentation

## 2013-12-31 DIAGNOSIS — F3289 Other specified depressive episodes: Secondary | ICD-10-CM | POA: Insufficient documentation

## 2013-12-31 DIAGNOSIS — Z87828 Personal history of other (healed) physical injury and trauma: Secondary | ICD-10-CM | POA: Insufficient documentation

## 2013-12-31 DIAGNOSIS — Z87891 Personal history of nicotine dependence: Secondary | ICD-10-CM | POA: Insufficient documentation

## 2013-12-31 LAB — BASIC METABOLIC PANEL
BUN: 7 mg/dL (ref 6–23)
CALCIUM: 9.8 mg/dL (ref 8.4–10.5)
CO2: 22 mEq/L (ref 19–32)
Chloride: 104 mEq/L (ref 96–112)
Creatinine, Ser: 0.68 mg/dL (ref 0.50–1.10)
Glucose, Bld: 86 mg/dL (ref 70–99)
POTASSIUM: 3.4 meq/L — AB (ref 3.7–5.3)
SODIUM: 141 meq/L (ref 137–147)

## 2013-12-31 LAB — CBC
HCT: 40.2 % (ref 36.0–46.0)
Hemoglobin: 13.6 g/dL (ref 12.0–15.0)
MCH: 27.1 pg (ref 26.0–34.0)
MCHC: 33.8 g/dL (ref 30.0–36.0)
MCV: 80.1 fL (ref 78.0–100.0)
PLATELETS: 278 10*3/uL (ref 150–400)
RBC: 5.02 MIL/uL (ref 3.87–5.11)
RDW: 15 % (ref 11.5–15.5)
WBC: 9 10*3/uL (ref 4.0–10.5)

## 2013-12-31 LAB — D-DIMER, QUANTITATIVE: D-Dimer, Quant: <0.27 ug/mL-FEU (ref 0.00–0.48)

## 2013-12-31 LAB — I-STAT TROPONIN, ED
Troponin i, poc: 0 ng/mL (ref 0.00–0.08)
Troponin i, poc: 0.01 ng/mL (ref 0.00–0.08)

## 2013-12-31 LAB — RAPID URINE DRUG SCREEN, HOSP PERFORMED
AMPHETAMINES: NOT DETECTED
BENZODIAZEPINES: NOT DETECTED
Barbiturates: NOT DETECTED
COCAINE: NOT DETECTED
Opiates: NOT DETECTED
Tetrahydrocannabinol: POSITIVE — AB

## 2013-12-31 LAB — ACETAMINOPHEN LEVEL: Acetaminophen (Tylenol), Serum: <15 ug/mL (ref 10–30)

## 2013-12-31 LAB — ETHANOL: Alcohol, Ethyl (B): <11 mg/dL (ref 0–11)

## 2013-12-31 LAB — SALICYLATE LEVEL: Salicylate Lvl: 2 mg/dL — ABNORMAL LOW (ref 2.8–20.0)

## 2013-12-31 LAB — CBG MONITORING, ED: GLUCOSE-CAPILLARY: 96 mg/dL (ref 70–99)

## 2013-12-31 LAB — POC URINE PREG, ED: PREG TEST UR: NEGATIVE

## 2013-12-31 MED ORDER — SODIUM CHLORIDE 0.9 % IV BOLUS (SEPSIS)
1000.0000 mL | Freq: Once | INTRAVENOUS | Status: AC
  Administered 2013-12-31: 1000 mL via INTRAVENOUS

## 2013-12-31 NOTE — ED Notes (Signed)
Pt has taken off all cardiac leads, and gown and gotten dressed. Pt told that she has now been medically cleared in order to have a psych consult. Pt agreeing to stay for psych consult. Mother at bedside. PA made aware and will come talk to Pt.

## 2013-12-31 NOTE — ED Notes (Signed)
Unable to get sitter at this time. PA made aware.

## 2013-12-31 NOTE — ED Notes (Signed)
Pt has taken off all cardiac leads, gown and attempting to remove IV. Encouraged pt to leave IV in place until the PA could come back in to talk to her, pt agreed. Pt has visitor at bedside.

## 2013-12-31 NOTE — ED Notes (Signed)
Pt eating turkey sandwich at bedside 

## 2013-12-31 NOTE — ED Notes (Signed)
Pt reports palpitations, elevated BP and low blood sugar.  Pt denies hx of HTN or DM.  Pt reports cp, dizziness and lightheadedness today.
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
## 2013-12-31 NOTE — ED Notes (Signed)
Patient transported to X-ray 

## 2013-12-31 NOTE — Discharge Instructions (Signed)
Please call your doctor for a followup appointment within 24-48 hours. When you talk to your doctor please let them know that you were seen in the emergency department and have them acquire all of your records so that they can discuss the findings with you and formulate a treatment plan to fully care for your new and ongoing problems. Please call and set-up an appointment with your primary care provider to be seen and assessed tomorrow Please call and set-up an appointment with your therapist tomorrow to be seen Please rest and stay hydrated Please continue to monitor symptoms closely and if symptoms are to worsen or change (fever greater than 101, chills, sweating, nausea, vomiting, chest pain, shortness of breath, difficulty breathing, headache, dizziness, increased depression, thoughts of hurting yourself or others, overwhelming sensations) please report back to the ED immediately   Panic Attacks Panic attacks are sudden, short-livedsurges of severe anxiety, fear, or discomfort. They may occur for no reason when you are relaxed, when you are anxious, or when you are sleeping. Panic attacks may occur for a number of reasons:   Healthy people occasionally have panic attacks in extreme, life-threatening situations, such as war or natural disasters. Normal anxiety is a protective mechanism of the body that helps [place] react to danger (fight or flight response).  Panic attacks are often seen with anxiety disorders, such as panic disorder, social anxiety disorder, generalized anxiety disorder, and phobias. Anxiety disorders cause excessive or uncontrollable anxiety. They may interfere with your relationships or other life activities.  Panic attacks are sometimes seen with other mental illnesses such as depression and posttraumatic stress disorder.  Certain medical conditions, prescription medicines, and drugs of abuse can cause panic attacks. SYMPTOMS  Panic attacks start suddenly, peak within 20  minutes, and are accompanied by four or more of the following symptoms:  Pounding heart or fast heart rate (palpitations).  Sweating.  Trembling or shaking.  Shortness of breath or feeling smothered.  Feeling choked.  Chest pain or discomfort.  Nausea or strange feeling in your stomach.  Dizziness, lightheadedness, or feeling like you will faint.  Chills or hot flushes.  Numbness or tingling in your lips or hands and feet.  Feeling that things are not real or feeling that you are not yourself.  Fear of losing control or going crazy.  Fear of dying. Some of these symptoms can mimic serious medical conditions. For example, you may think you are having a heart attack. Although panic attacks can be very scary, they are not life threatening. DIAGNOSIS  Panic attacks are diagnosed through an assessment by your health care provider. Your health care provider will ask questions about your symptoms, such as where and when they occurred. Your health care provider will also ask about your medical history and use of alcohol and drugs, including prescription medicines. Your health care provider may order blood tests or other studies to rule out a serious medical condition. Your health care provider may refer you to a mental health professional for further evaluation. TREATMENT   Most healthy people who have one or two panic attacks in an extreme, life-threatening situation will not require treatment.  The treatment for panic attacks associated with anxiety disorders or other mental illness typically involves counseling with a mental health professional, medicine, or a combination of both. Your health care provider will help determine what treatment is best for you.  Panic attacks due to physical illness usually goes away with treatment of the illness. If prescription medicine is  causing panic attacks, talk with your health care provider about stopping the medicine, decreasing the dose, or  substituting another medicine.  Panic attacks due to alcohol or drug abuse goes away with abstinence. Some adults need professional help in order to stop drinking or using drugs. HOME CARE INSTRUCTIONS   Take all your medicines as prescribed.   Check with your health care provider before starting new prescription or over-the-counter medicines.  Keep all follow up appointments with your health care provider. SEEK MEDICAL CARE IF:  You are not able to take your medicines as prescribed.  Your symptoms do not improve or get worse. SEEK IMMEDIATE MEDICAL CARE IF:   You experience panic attack symptoms that are different than your usual symptoms.  You have serious thoughts about hurting yourself or others.  You are taking medicine for panic attacks and have a serious side effect. MAKE SURE YOU:  Understand these instructions.  Will watch your condition.  Will get help right away if you are not doing well or get worse. Document Released: 07/18/2005 Document Revised: 05/08/2013 Document Reviewed: 03/01/2013 [facility] Patient Information 2014 [place], [place].   Emergency Department Resource Guide 1) Find a Doctor and Pay Out of Pocket Although you won't have to find out who is covered by your insurance plan, it is a good idea to ask around and get recommendations. You will then need to call the office and see if the doctor you have chosen will accept you as a new patient and what types of options they offer for patients who are self-pay. Some doctors offer discounts or will set up payment plans for their patients who do not have insurance, but you will need to ask so you aren't surprised when you get to your appointment.  2) Contact Your Local Health Department Not all health departments have doctors that can see patients for sick visits, but many do, so it is worth a call to see if yours does. If you don't know where your local health department is, you can check in your phone book. The  CDC also has a tool to help you locate your state's health department, and many state websites also have listings of all of their local health departments.  3) Find a Walk-in Clinic If your illness is not likely to be very severe or complicated, you may want to try a walk in clinic. These are popping up all over the country in pharmacies, drugstores, and shopping centers. They're usually staffed by nurse practitioners or physician assistants that have been trained to treat common illnesses and complaints. They're usually fairly quick and inexpensive. However, if you have serious medical issues or chronic medical problems, these are probably not your best option.  No Primary Care Doctor: - Call Health Connect at  [phone] - they can help you locate a primary care doctor that  accepts your insurance, provides certain services, etc. - Physician Referral Service- [phone]  Chronic Pain Problems: Organization         Address  Phone   Notes  [facility]  [phone] Patients need to be referred by their primary care doctor.   Medication Assistance: Organization         Address  Phone   Notes  [facility] Medication [facility] [address] [place] [place], [place] [address] [phone] --Must be a resident of [facility] -- Must have NO insurance coverage whatsoever (no Medicaid/ Medicare, etc.) -- The pt. MUST have a  primary care doctor that directs their care regularly and follows them in the community   Med[facility]ist  [phone]   [employer]  [phone]    Agencies that provide inexpensive medical care: Organization         Address  Phone   Notes  [facility]  [phone]   [facility]    [phone]   [facility] [address] [place], [place] [id number] [phone]   [facility] [place] [address], [place] [phone]   Planned Parenthood    [phone](336)  [phone]   [facility]    [phone]   [facility]  [address], [place] Phone:  [phone]) [phone], Fax:  [phone] Hours of Operation:  9 am - 6 pm, M-F.  Also accepts Medicaid/Medicare and self-pay.  [facility]  [address], [place] Phone: [phone](336) [phone], Fax: [phone]) [phone]. Hours of Operation:  8:30 am - 5:30 pm, M-F.  Also accepts Medicaid and self-pay.  [facility] [place] [address], [place] Phone: [phone](336) [phone]   [facility] [address], [place], [place] [phone], Ext. [phone] Mondays & Thursdays: 7-9 AM.  First 15 patients are seen on a first come, first serve basis.    Medicaid-accepting [facility] Providers:  Organization         Address  Phone   Notes  [facility] [address], [place] [phone] Also accepts self-pay patients.  [facility] [address], [place]  [phone]) [phone]   [facility] [address], [place] [phone]   [facility] [address] [place] [address], [place] [phone]   Renaye RakersVeita Bland [address], [place]   [phone] Only accepts [place] Access [place] patients after they have their name applied to their card.   Self-Pay (no insurance) in [facility]:  Organization         Address  Phone   Notes  Sickle Cell Patients, [facility] [address], [place] [phone]   [facility] [address], [place] [phone]   [facility]  [address], [place] [phone]   [facility]/Dr. Osei-Bonsu  [address] [place] [address], [place] or [phone], [place] [phone] Phone number for both [place] and [place] locations is the same.  [facility] [address], [place] [phone]   [facility] [address] [place] [address], [place] or [address] [phone]) [phone]) [phone]   [facility] [address], [place] [phone]) [phone], phone; [phone](336) [phone], fax Sees patients 1st and 3rd Saturday of every month.  Must not qualify for public or private insurance (i.e. Medicaid, Medicare,  Health Choice, Veterans' Benefits)  Household income should be no more than 200% of the poverty level The clinic cannot treat you if you are pregnant or think you are pregnant  Sexually transmitted diseases are not treated at the clinic.    Dental Care: Organization         Address  Phone  Notes  [facility] [address], [place] [phone] Accepts children up to age 76 who are enrolled in Medicaid or [place] Health Choice; pregnant women with a Medicaid card; and children who have applied for Medicaid or [place] Health Choice, but were declined, whose parents can pay a reduced fee at time of service.  [facility]  [address], [place] [phone] Accepts children up to age 35 who are enrolled in [place] or [place] Health Choice; pregnant women with a Medicaid card; and children who have applied for Medicaid or [place] Health Choice, but were declined, whose parents can pay a reduced fee at time of service.  [facility]  [address] [place], [place] [phone] Patients are seen by appointment only. Walk-ins are not accepted. [facility] will see patients 36 years of age and older. Monday - Tuesday (8am-5pm) Most Wednesdays (8:30-5pm) $30 per visit, cash only  [facility]  [address], [facility] [phone] Patients are seen by appointment only. Walk-ins are not accepted. [facility] will see patients 25 years of age and older. One  Wednesday Evening (Monthly: Volunteer Based).  $30 per visit, cash only  [employer]  [phone] for adults; Children under age 30, call Graduate Pediatric Dentistry at [phone]. Children aged 53-14, please call [phone] to request a pediatric application.  Dental services are provided in all areas of dental care including fillings, crowns and bridges, complete and partial dentures, implants, gum treatment, root canals, and extractions. Preventive care is also provided. Treatment is provided to both adults and children. Patients are selected via a lottery and there is often a waiting list.   [facility] [address], [place]  [phone] [email or web address]   [facility] [address] [place], [place] [phone], Ext. [phone] Second and Fourth Thursday of each month, opens at 6:30 AM; Clinic ends at 9 AM.  Patients are seen on a first-come first-served basis, and a limited number are seen during each clinic.   [facility]  [address] [place], [place] [phone]   Eligibility Requirements You must have lived in [place], [place], or [place] counties for at least the last three months.   You cannot be eligible for state or federal sponsored [place], including [employer], [place], or [employer].   You generally cannot be eligible for healthcare insurance through your employer.    How to apply: Eligibility screenings are held every Tuesday and Wednesday afternoon from 1:00 pm until 4:00 pm. You do not need an appointment for the interview!  [facility] [address] [place], [place] [phone]   [facility] Health Department  [phone]   [facility] Health Department  [phone]   [facility] Health Department  [phone]    Behavioral Health Resources in the Community: Intensive Outpatient Programs Organization          Address  Phone  Notes  [facility] [address], [place], [place] [phone]   [facility] [address], [place], [place] [phone]   [facility] [address], [place], [place]  [phone]   [facility] [address],  [place], [place] [phone] or [phone]   Substance Abuse Resources Organization         Address  Phone  Notes  Alcohol [facility] Drug Services  [phone]  [facility]  [phone]   [facility]  [phone]   Floydene Flock  [phone]   Residential & Outpatient Substance Abuse Program  [phone]   Psychological Services Organization         Address  Phone  Notes  [facility]  [phone]   [facility]  [phone]   [facility] [address], [place] [phone] or [phone]    Mobile Crisis Teams Organization         Address  Phone  Notes  [facility], Mobile Crisis Care Unit  [phone]   [facility]  [address] [place], [place] [phone]   Doristine Locks [address] [place] [phone]    Self-Help/Support Groups Organization         Address  Phone             Notes  [facility] [place] - variety of support groups  [phone]- I7437963 Call for more information  Narcotics Anonymous (NA), [facility] [address], [employer] [place]  2 meetings at this location   Statistician         Address  Phone  Notes  [facility] [address],    [place]  [phone]   [facility]  [address] [place] [address], [place], [place] [phone]   [facility] [address] [place], [place] [phone] Admissions: 8am-3pm M-F  [facility] [address]    [place], [place] [phone]   [facility] [address] [place], [place], [place] [phone]   [facility] [address]  [place], [place] [phone]   [facility] - Intensive Outpatient [address], [place], [place] [phone]   [facility] ([facility] [address] [place].,  [place], [place] [phone] or [phone]   [facility] (RT[facility]) [address] [place], [place] [phone] Accepts Medicaid  [facility] [address]  [place] [phone] Substance Abuse/Addiction Treatment   [facility] Organization         Address  Phone  Notes  [facility]  [phone]   Angie Fava, PhD [address]vin Kn[address] [place], [place]   [phone] or [phone]   [facility] Behavioral   [address] [place], [place] [phone]   [facility] [address], [place], [place] [phone] Insurance/Medicaid/sponsorship through [facility] and Families [address]                                    [place], [place] [phone] Therapy/tele-psych/case  [facility] [address], [place] [phone]    Dr. Lolly Mustache  [phone]   [facility] [place]  [employer] [facility] Dept. 1) [address], [place] 2) [address], [place] 3)  [address], [place] [phone]  [phone]   [facility] Child Abuse Hotline (224) 682-4692 or [phone] (After Hours)

## 2014-01-02 NOTE — ED Provider Notes (Signed)
Medical screening examination/treatment/procedure(s) were performed by non-physician practitioner and as supervising physician I was immediately available for consultation/collaboration.   EKG Interpretation   Date/Time:  Tuesday December 31 2013 17:06:04 EDT Ventricular Rate:  121 PR Interval:  141 QRS Duration: 79 QT Interval:  334 QTC Calculation: 474 R Axis:   60 Text Interpretation:  Sinus tachycardia Borderline T wave abnormalities  Confirmed by Wilkie Aye  MD, COURTNEY ([id number]) on 12/31/2013 7:14:46 PM        Shon Baton, MD 01/02/14 [id number]

## 2014-01-14 ENCOUNTER — Emergency Department (HOSPITAL_COMMUNITY)
Admission: EM | Admit: 2014-01-14 | Discharge: 2014-01-15 | Disposition: A | Discharge status: Other Acute Inpatient Hospital | Payer: Self-pay | Attending: Emergency Medicine | Admitting: Emergency Medicine

## 2014-01-14 ENCOUNTER — Ambulatory Visit (HOSPITAL_COMMUNITY)
Admission: RE | Admit: 2014-01-14 | Discharge: 2014-01-14 | Disposition: A | Discharge status: Home / Self Care | Payer: 59 | Attending: Psychiatry | Admitting: Psychiatry

## 2014-01-14 ENCOUNTER — Encounter (HOSPITAL_COMMUNITY): Payer: Self-pay | Admitting: Emergency Medicine

## 2014-01-14 DIAGNOSIS — J45909 Unspecified asthma, uncomplicated: Secondary | ICD-10-CM | POA: Insufficient documentation

## 2014-01-14 DIAGNOSIS — F3289 Other specified depressive episodes: Secondary | ICD-10-CM | POA: Insufficient documentation

## 2014-01-14 DIAGNOSIS — Z87891 Personal history of nicotine dependence: Secondary | ICD-10-CM | POA: Insufficient documentation

## 2014-01-14 DIAGNOSIS — F1210 Cannabis abuse, uncomplicated: Secondary | ICD-10-CM | POA: Insufficient documentation

## 2014-01-14 DIAGNOSIS — F32A Depression, unspecified: Secondary | ICD-10-CM

## 2014-01-14 DIAGNOSIS — Z872 Personal history of diseases of the skin and subcutaneous tissue: Secondary | ICD-10-CM | POA: Insufficient documentation

## 2014-01-14 DIAGNOSIS — Z8719 Personal history of other diseases of the digestive system: Secondary | ICD-10-CM | POA: Insufficient documentation

## 2014-01-14 DIAGNOSIS — Z79899 Other long term (current) drug therapy: Secondary | ICD-10-CM | POA: Insufficient documentation

## 2014-01-14 DIAGNOSIS — R45851 Suicidal ideations: Secondary | ICD-10-CM | POA: Insufficient documentation

## 2014-01-14 DIAGNOSIS — F329 Major depressive disorder, single episode, unspecified: Secondary | ICD-10-CM | POA: Insufficient documentation

## 2014-01-14 LAB — RAPID URINE DRUG SCREEN, HOSP PERFORMED
Amphetamines: NOT DETECTED
Barbiturates: NOT DETECTED
Benzodiazepines: NOT DETECTED
COCAINE: NOT DETECTED
Opiates: NOT DETECTED
TETRAHYDROCANNABINOL: POSITIVE — AB

## 2014-01-14 LAB — COMPREHENSIVE METABOLIC PANEL
ALT: 14 U/L (ref 0–35)
AST: 15 U/L (ref 0–37)
Albumin: 3.8 g/dL (ref 3.5–5.2)
Alkaline Phosphatase: 62 U/L (ref 39–117)
BUN: 8 mg/dL (ref 6–23)
CALCIUM: 9.7 mg/dL (ref 8.4–10.5)
CHLORIDE: 103 meq/L (ref 96–112)
CO2: 23 meq/L (ref 19–32)
Creatinine, Ser: 0.69 mg/dL (ref 0.50–1.10)
GFR calc Af Amer: >90 mL/min (ref >90)
GFR calc non Af Amer: >90 mL/min (ref >90)
Glucose, Bld: 84 mg/dL (ref 70–99)
Potassium: 4 mEq/L (ref 3.7–5.3)
Sodium: 138 mEq/L (ref 137–147)
Total Bilirubin: 0.4 mg/dL (ref 0.3–1.2)
Total Protein: 7.5 g/dL (ref 6.0–8.3)

## 2014-01-14 LAB — CBC
HCT: 40.4 % (ref 36.0–46.0)
Hemoglobin: 13.7 g/dL (ref 12.0–15.0)
MCH: 27.1 pg (ref 26.0–34.0)
MCHC: 33.9 g/dL (ref 30.0–36.0)
MCV: 79.8 fL (ref 78.0–100.0)
Platelets: 250 10*3/uL (ref 150–400)
RBC: 5.06 MIL/uL (ref 3.87–5.11)
RDW: 15.4 % (ref 11.5–15.5)
WBC: 10.3 10*3/uL (ref 4.0–10.5)

## 2014-01-14 LAB — ETHANOL: Alcohol, Ethyl (B): <11 mg/dL (ref 0–11)

## 2014-01-14 LAB — LITHIUM LEVEL: Lithium Lvl: <0.25 mEq/L — ABNORMAL LOW (ref 0.80–1.40)

## 2014-01-14 LAB — ACETAMINOPHEN LEVEL

## 2014-01-14 LAB — SALICYLATE LEVEL: Salicylate Lvl: <2 mg/dL — ABNORMAL LOW (ref 2.8–20.0)

## 2014-01-14 MED ORDER — ZOLPIDEM TARTRATE 5 MG PO TABS
5.0000 mg | ORAL_TABLET | Freq: Every evening | ORAL | Status: DC | PRN
  Administered 2014-01-14: 5 mg via ORAL
  Filled 2014-01-14: qty 1

## 2014-01-14 MED ORDER — LORATADINE 10 MG PO TABS
10.0000 mg | ORAL_TABLET | Freq: Every day | ORAL | Status: DC
  Administered 2014-01-14 – 2014-01-15 (×2): 10 mg via ORAL
  Filled 2014-01-14 (×2): qty 1

## 2014-01-14 MED ORDER — LITHIUM CARBONATE ER 450 MG PO TBCR: 112.5000 mg | EXTENDED_RELEASE_TABLET | Freq: Every day | ORAL | Status: DC

## 2014-01-14 MED ORDER — ONDANSETRON 4 MG PO TBDP
4.0000 mg | ORAL_TABLET | Freq: Three times a day (TID) | ORAL | Status: DC | PRN
  Administered 2014-01-14: 4 mg via ORAL
  Filled 2014-01-14: qty 1

## 2014-01-14 MED ORDER — LITHIUM CARBONATE 150 MG PO CAPS
150.0000 mg | ORAL_CAPSULE | Freq: Every day | ORAL | Status: DC
  Administered 2014-01-14 – 2014-01-15 (×2): 150 mg via ORAL
  Filled 2014-01-14 (×2): qty 1

## 2014-01-14 MED ORDER — PRAZOSIN HCL 1 MG PO CAPS
1.0000 mg | ORAL_CAPSULE | Freq: Two times a day (BID) | ORAL | Status: DC
  Administered 2014-01-14 – 2014-01-15 (×2): 1 mg via ORAL
  Filled 2014-01-14 (×4): qty 1

## 2014-01-14 MED ORDER — ACETAMINOPHEN 325 MG PO TABS
650.0000 mg | ORAL_TABLET | ORAL | Status: DC | PRN
  Administered 2014-01-14: 650 mg via ORAL
  Filled 2014-01-14: qty 2

## 2014-01-14 MED ORDER — CLONAZEPAM 0.5 MG PO TABS
0.5000 mg | ORAL_TABLET | Freq: Every evening | ORAL | Status: DC | PRN
  Administered 2014-01-14: 0.5 mg via ORAL
  Filled 2014-01-14: qty 1

## 2014-01-14 MED ORDER — IBUPROFEN 200 MG PO TABS
600.0000 mg | ORAL_TABLET | Freq: Three times a day (TID) | ORAL | Status: DC | PRN
  Administered 2014-01-14: 600 mg via ORAL
  Filled 2014-01-14: qty 3

## 2014-01-14 MED ORDER — NICOTINE 21 MG/24HR TD PT24: 21.0000 mg | MEDICATED_PATCH | Freq: Every day | TRANSDERMAL | Status: DC

## 2014-01-14 MED ORDER — MONTELUKAST SODIUM 10 MG PO TABS
10.0000 mg | ORAL_TABLET | Freq: Every day | ORAL | Status: DC
  Administered 2014-01-14: 10 mg via ORAL
  Filled 2014-01-14 (×2): qty 1

## 2014-01-14 MED ORDER — LORAZEPAM 1 MG PO TABS: 1.0000 mg | ORAL_TABLET | Freq: Three times a day (TID) | ORAL | Status: DC | PRN

## 2014-01-14 MED ORDER — CITALOPRAM HYDROBROMIDE 10 MG PO TABS
10.0000 mg | ORAL_TABLET | Freq: Every day | ORAL | Status: DC
  Administered 2014-01-14 – 2014-01-15 (×2): 10 mg via ORAL
  Filled 2014-01-14 (×2): qty 1

## 2014-01-14 NOTE — Progress Notes (Signed)
[facility] provided patient with a list of primary care resources and a [employer] Orange Card application, highlighting [facility] to help patient establish primary care.
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
## 2014-01-14 NOTE — Progress Notes (Signed)
[place] [phone]. Per Hillis RangeKathlyn there are available beds. Writer faxed referrals to [phone].   [facility] [phone]. Per Johnathan there are available beds. Writer faxed referrals to [phone].   [facility] [phone]. Per Geri Seminoleosalyn they are accepting patients from their wait list. Writer faxed referral so that the patient can be reviewed and then placed on their wait list. The fax number is [phone].   [facility] [phone], writer left a message with the intake coordinator Agustin Cree(Darlene).   [facility] [phone]. Per Eulah CitizenStevie, there are available beds. Writer faxed referrals to [phone].

## 2014-01-14 NOTE — ED Notes (Addendum)
 .  Patient has two bags of belongings at the triage nurses station. patient has a Conservator, museum/galleryTablet,2cell phones with charger. These Items are locked up with security.
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
## 2014-01-14 NOTE — ED Notes (Signed)
Patient reports nausea and migraine pain 8/10. Patient denies feelings of SI, HI at present.  Encouragement offered. Given Zofran.  Patient safety maintained, Q 15 checks in place.

## 2014-01-14 NOTE — ED Notes (Signed)
[facility] for [facility] for depression and SI.  Pt states that her plan is to take pills, "but i dont have any pills potent enough to take, or get a gun."

## 2014-01-15 ENCOUNTER — Encounter (HOSPITAL_COMMUNITY): Payer: Self-pay

## 2014-01-15 ENCOUNTER — Inpatient Hospital Stay (HOSPITAL_COMMUNITY)
Admission: AD | Admit: 2014-01-15 | Discharge: 2014-01-22 | DRG: 885 | Disposition: A | Discharge status: Home / Self Care | Payer: 59 | Source: Intra-hospital | Attending: Psychiatry | Admitting: Psychiatry

## 2014-01-15 DIAGNOSIS — G2581 Restless legs syndrome: Secondary | ICD-10-CM | POA: Diagnosis present

## 2014-01-15 DIAGNOSIS — F3130 Bipolar disorder, current episode depressed, mild or moderate severity, unspecified: Principal | ICD-10-CM | POA: Diagnosis present

## 2014-01-15 DIAGNOSIS — R45851 Suicidal ideations: Secondary | ICD-10-CM

## 2014-01-15 DIAGNOSIS — Z8249 Family history of ischemic heart disease and other diseases of the circulatory system: Secondary | ICD-10-CM

## 2014-01-15 DIAGNOSIS — F4310 Post-traumatic stress disorder, unspecified: Secondary | ICD-10-CM

## 2014-01-15 DIAGNOSIS — F332 Major depressive disorder, recurrent severe without psychotic features: Secondary | ICD-10-CM

## 2014-01-15 DIAGNOSIS — F909 Attention-deficit hyperactivity disorder, unspecified type: Secondary | ICD-10-CM | POA: Diagnosis present

## 2014-01-15 DIAGNOSIS — F410 Panic disorder [episodic paroxysmal anxiety] without agoraphobia: Secondary | ICD-10-CM | POA: Diagnosis present

## 2014-01-15 DIAGNOSIS — F319 Bipolar disorder, unspecified: Secondary | ICD-10-CM

## 2014-01-15 DIAGNOSIS — J45909 Unspecified asthma, uncomplicated: Secondary | ICD-10-CM | POA: Diagnosis present

## 2014-01-15 DIAGNOSIS — F900 Attention-deficit hyperactivity disorder, predominantly inattentive type: Secondary | ICD-10-CM

## 2014-01-15 DIAGNOSIS — Z23 Encounter for immunization: Secondary | ICD-10-CM

## 2014-01-15 DIAGNOSIS — Z833 Family history of diabetes mellitus: Secondary | ICD-10-CM

## 2014-01-15 DIAGNOSIS — F411 Generalized anxiety disorder: Secondary | ICD-10-CM | POA: Diagnosis present

## 2014-01-15 DIAGNOSIS — F322 Major depressive disorder, single episode, severe without psychotic features: Secondary | ICD-10-CM | POA: Diagnosis present

## 2014-01-15 DIAGNOSIS — F333 Major depressive disorder, recurrent, severe with psychotic symptoms: Secondary | ICD-10-CM

## 2014-01-15 DIAGNOSIS — G4700 Insomnia, unspecified: Secondary | ICD-10-CM | POA: Diagnosis present

## 2014-01-15 DIAGNOSIS — Z87891 Personal history of nicotine dependence: Secondary | ICD-10-CM

## 2014-01-15 MED ORDER — ACETAMINOPHEN 325 MG PO TABS
650.0000 mg | ORAL_TABLET | Freq: Four times a day (QID) | ORAL | Status: DC | PRN
  Administered 2014-01-17 – 2014-01-21 (×4): 650 mg via ORAL
  Filled 2014-01-15 (×4): qty 2

## 2014-01-15 MED ORDER — CLONAZEPAM 0.5 MG PO TABS
0.5000 mg | ORAL_TABLET | Freq: Every evening | ORAL | Status: DC | PRN
  Administered 2014-01-17 – 2014-01-20 (×4): 0.5 mg via ORAL
  Filled 2014-01-15 (×4): qty 1

## 2014-01-15 MED ORDER — PRAZOSIN HCL 1 MG PO CAPS
1.0000 mg | ORAL_CAPSULE | Freq: Two times a day (BID) | ORAL | Status: DC
  Administered 2014-01-15 – 2014-01-22 (×14): 1 mg via ORAL
  Filled 2014-01-15: qty 1
  Filled 2014-01-15: qty 8
  Filled 2014-01-15 (×6): qty 1
  Filled 2014-01-15: qty 8
  Filled 2014-01-15 (×12): qty 1

## 2014-01-15 MED ORDER — LORATADINE 10 MG PO TABS
10.0000 mg | ORAL_TABLET | Freq: Every day | ORAL | Status: DC
  Administered 2014-01-16 – 2014-01-22 (×7): 10 mg via ORAL
  Filled 2014-01-15 (×3): qty 1
  Filled 2014-01-15: qty 4
  Filled 2014-01-15 (×6): qty 1

## 2014-01-15 MED ORDER — ZOLPIDEM TARTRATE 5 MG PO TABS
5.0000 mg | ORAL_TABLET | Freq: Every evening | ORAL | Status: DC | PRN
  Administered 2014-01-15: 5 mg via ORAL
  Filled 2014-01-15: qty 1

## 2014-01-15 MED ORDER — ONDANSETRON 4 MG PO TBDP: 4.0000 mg | ORAL_TABLET | Freq: Three times a day (TID) | ORAL | Status: DC | PRN

## 2014-01-15 MED ORDER — MONTELUKAST SODIUM 10 MG PO TABS
10.0000 mg | ORAL_TABLET | Freq: Every day | ORAL | Status: DC
  Administered 2014-01-15 – 2014-01-21 (×7): 10 mg via ORAL
  Filled 2014-01-15 (×5): qty 1
  Filled 2014-01-15: qty 4
  Filled 2014-01-15 (×4): qty 1

## 2014-01-15 MED ORDER — CITALOPRAM HYDROBROMIDE 10 MG PO TABS
10.0000 mg | ORAL_TABLET | Freq: Every day | ORAL | Status: DC
  Administered 2014-01-16: 10 mg via ORAL
  Filled 2014-01-15 (×2): qty 1

## 2014-01-15 MED ORDER — MAGNESIUM HYDROXIDE 400 MG/5ML PO SUSP: 30.0000 mL | Freq: Every day | ORAL | Status: DC | PRN

## 2014-01-15 MED ORDER — ALUM & MAG HYDROXIDE-SIMETH 200-200-20 MG/5ML PO SUSP: 30.0000 mL | ORAL | Status: DC | PRN

## 2014-01-15 NOTE — ED Notes (Signed)
Pt aaox3.  Pt calm and cooperative.  Pt denies SI/HI/AH/VH at this time.  Pt reports "since i have been in here, I feel safe,"  Pt reprots "I even slept better last nigtht."  Will continue to monitor.

## 2014-01-15 NOTE — BH Assessment (Signed)
[facility] psychiatry, Dr.Taylor and Shuvon Rankin,NP. Patient is accepted to bed 303-2.  Support paperwork completed to include voluntary consent and ROI. Pt's  ED nurse notified of this.   Glorious PeachNajah Presley, MS, LCASA Assessment Counselor

## 2014-01-15 NOTE — ED Provider Notes (Signed)
Medical screening examination/treatment/procedure(s) were performed by non-physician practitioner and as supervising physician I was immediately available for consultation/collaboration.  Gilda Creasehristopher J. Beverlyn Mcginness, MD 01/15/14 [phone]
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
## 2014-01-15 NOTE — Tx Team (Signed)
Initial Interdisciplinary Treatment Plan  PATIENT STRENGTHS: (choose at least two) Ability for insight Active sense of humor Average or above average intelligence Communication skills Supportive family/friends  PATIENT STRESSORS: Financial difficulties Occupational concerns   PROBLEM LIST: Problem List/Patient Goals Date to be addressed Date deferred Reason deferred Estimated date of resolution  Medications adjustment      GAD, depression coping skills needed                                                 DISCHARGE CRITERIA:  Improved stabilization in mood, thinking, and/or behavior Motivation to continue treatment in a less acute level of care Verbal commitment to aftercare and medication compliance  PRELIMINARY DISCHARGE PLAN: Return to previous living arrangement  PATIENT/FAMIILY INVOLVEMENT: This treatment plan has been presented to and reviewed with the patient, Galvin ProfferJenna A Adan, and/or family member, .  The patient and family have been given the opportunity to ask questions and make suggestions.  Cooper RenderSadler, Pamela Jean Horne 01/15/2014, 6:47 PM

## 2014-01-15 NOTE — ED Notes (Signed)
Pt provided with Psychoeducational materials on developing a Suicide Safety Plan. 

## 2014-01-15 NOTE — ED Notes (Signed)
Patient pacing in room. Used call bell again. Patient has used call bell repeatedly to ask for her clothing and demand to leave. Patient states it is her decision and not the doctor's that she is ready to leave.
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
## 2014-01-15 NOTE — [facility] Group Notes (Signed)
Pt attended NA group tonight. 

## 2014-01-16 ENCOUNTER — Encounter (HOSPITAL_COMMUNITY): Payer: Self-pay | Admitting: Psychiatry

## 2014-01-16 DIAGNOSIS — F909 Attention-deficit hyperactivity disorder, unspecified type: Secondary | ICD-10-CM

## 2014-01-16 DIAGNOSIS — F3130 Bipolar disorder, current episode depressed, mild or moderate severity, unspecified: Principal | ICD-10-CM

## 2014-01-16 MED ORDER — ZOLPIDEM TARTRATE 5 MG PO TABS
5.0000 mg | ORAL_TABLET | Freq: Every evening | ORAL | Status: DC | PRN
  Administered 2014-01-16 – 2014-01-20 (×5): 5 mg via ORAL
  Filled 2014-01-16 (×5): qty 1

## 2014-01-16 MED ORDER — ESCITALOPRAM OXALATE 5 MG PO TABS
5.0000 mg | ORAL_TABLET | Freq: Every day | ORAL | Status: DC
  Administered 2014-01-16 – 2014-01-20 (×5): 5 mg via ORAL
  Filled 2014-01-16 (×7): qty 1

## 2014-01-16 MED ORDER — GLUCERNA SHAKE PO LIQD
237.0000 mL | Freq: Three times a day (TID) | ORAL | Status: DC
  Administered 2014-01-16 – 2014-01-20 (×10): 237 mL via ORAL

## 2014-01-16 MED ORDER — NICOTINE 14 MG/24HR TD PT24
14.0000 mg | MEDICATED_PATCH | Freq: Every day | TRANSDERMAL | Status: DC
  Administered 2014-01-16 – 2014-01-21 (×6): 14 mg via TRANSDERMAL
  Filled 2014-01-16 (×10): qty 1

## 2014-01-16 MED ORDER — OXCARBAZEPINE 150 MG PO TABS
150.0000 mg | ORAL_TABLET | Freq: Two times a day (BID) | ORAL | Status: DC
  Administered 2014-01-16 – 2014-01-17 (×3): 150 mg via ORAL
  Filled 2014-01-16 (×8): qty 1

## 2014-01-16 MED ORDER — PNEUMOCOCCAL VAC POLYVALENT 25 MCG/0.5ML IJ INJ
0.5000 mL | INJECTION | INTRAMUSCULAR | Status: AC
  Administered 2014-01-17: 0.5 mL via INTRAMUSCULAR

## 2014-01-16 MED ORDER — ZOLPIDEM TARTRATE 10 MG PO TABS: 10.0000 mg | ORAL_TABLET | Freq: Every evening | ORAL | Status: DC | PRN

## 2014-01-16 NOTE — [facility] Suicide Risk Assessment (Signed)
[facility] INPATIENT:  Family/Significant Other Suicide Prevention Education  Suicide Prevention Education:  Education Completed; Morgan [name]) has been identified by the patient as the family member/significant other with whom the patient will be residing, and identified as the person(s) who will aid the patient in the event of a mental health crisis (suicidal ideations/suicide attempt).  With written consent from the patient, the family member/significant other has been provided the following suicide prevention education, prior to the and/or following the discharge of the patient.  The suicide prevention education provided includes the following:  Suicide risk factors  Suicide prevention and interventions  National Suicide Hotline telephone number  [facility] assessment telephone number  [facility] Emergency Assistance 911  [facility] and/or Residential Mobile Crisis Unit telephone number  Request made of family/significant other to:  Remove weapons (e.g., guns, rifles, knives), all items previously/currently identified as safety concern.    Remove drugs/medications (over-the-counter, prescriptions, illicit drugs), all items previously/currently identified as a safety concern.  The family member/significant other verbalizes understanding of the suicide prevention education information provided.  The family member/significant other agrees to remove the items of safety concern listed above.  Mr. Morgan [name]'s reported his understanding of SPE. No other concerns verbalized.   PICKETT JR, GREGORY C 01/16/2014, 12:01 PM

## 2014-01-16 NOTE — Progress Notes (Signed)
Patient ID: [name], female   DOB: 1990-09-12, 23 y.o.   MRN: [id number] D: Pt. Visible on unit, expresses being happy about visit from BF today. Reports depression at "4" of 10 and anxiety at "5" of 10. A: Writer provided emotional support encouraged to take medication as scheduled and attend groups. Staff will monitor q2115min for safety. R: Pt. Is safe on the unit, attended karaoke.
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
## 2014-01-16 NOTE — Progress Notes (Signed)
Adult Activity Group Note  Date: 01/16/2014 Time:2:45p  Group Topic: Art  Participation Level: Active  Participation Quality: Appropriate, Attentive and helpful  Affect:  Appropriate  Activity: Patients asked to create art to coincide with daily unit theme using any combination of markers, crayons, color pencils, construction paper, magazine clippings, glue, and scissors. Pt's wrote ideas on leaves of things they wanted to do this summer and posted them on the 300 hall.  Pt mentioned wanting to go back to school as a new activity for her.

## 2014-01-16 NOTE — Progress Notes (Signed)
Nutrition Brief Note Patient identified on the Malnutrition Screening Tool (MST) Report.  Wt Readings from Last 10 Encounters:  01/15/14 219 lb (99.338 kg)  12/01/13 227 lb 6 oz (103.137 kg)  10/09/13 250 lb (113.399 kg)  08/17/13 251 lb 9.6 oz (114.125 kg)  06/24/13 244 lb (110.678 kg)  06/11/13 244 lb (110.678 kg)  05/27/13 239 lb (108.41 kg)  05/19/13 239 lb (108.41 kg)  05/02/13 240 lb (108.863 kg)  04/27/13 234 lb (106.142 kg)   Body mass index is 35.07 kg/(m^2). Patient meets criteria for obesity based on current BMI.   Discussed intake PTA with patient and compared to intake presently.  Discussed changes in intake, if any, and encouraged adequate intake of meals and snacks. Current diet order is regular and pt is also offered choice of unit snacks mid-morning and mid-afternoon.  Pt is eating as desired.   Labs and medications reviewed.   Pt reports that she believes that she has "blood sugar issues." She says that she can feel her blood glucose drop when she doesn't have any carbohydrates. She begins to feel tired and weak, and drinks soda to feel better. Pt reports that she has asked her doctor about these issues and the doctor said that she did not have diabetes. RD recommended that pt ask her doctor to be referred to an endocrinologist if she continues to feel these "highs and lows." Pt was also provided with handouts from the Academy of Nutrition and Dietetics regarding a carbohydrate modified diet.   Pt says that her usual body weight is 255 lbs (Jan 2015.)  Pt reports that she has not been eating well and has felt nauseated.   Nutrition Dx:  Unintended wt change r/t suboptimal oral intake AEB pt report  Interventions:   Discussed the importance of nutrition and encouraged intake of food and beverages.     Discussed weight goals with patient.   Supplements: Glucerna Shake po once daily, each supplement provides 220 kcal and 10 grams of protein    No additional nutrition  interventions warranted at this time. If nutrition issues arise, please consult RD.   Ebbie LatusHaley Hawkins RD, LDN
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
## 2014-01-16 NOTE — [facility] Group Notes (Signed)
0900 nursing orientation group   The focus of this group is to educate the patient on the purpose and policies of crisis stabilization and provide a format to answer questions about their admission.  The group details unit policies and expectations of patients while admitted.   Pt did not attend she was with Dr. Dub MikesLugo.

## 2014-01-16 NOTE — Progress Notes (Signed)
Patient ID: [name], female   DOB: 1990-10-11, 23 y.o.   MRN: [id number] D: pt. Visible in dayroom, interacting, with others, reports depression at "7" of 10 and anxiety at "4' of 10. A: Writer introduced self to client, provided emotional support, encouraged client to report any concern. Staff will monitor q4115min for safety, encouraged group. R: pt. Is safe on the unit, attended group.

## 2014-01-16 NOTE — [facility] Group Notes (Signed)
[facility] LCSW Group Therapy  01/16/2014 3:12 PM  Type of Therapy:  Group Therapy  Participation Level:  Active  Participation Quality:  Attentive and Sharing  Affect:  Depressed  Cognitive:  Alert and Oriented  Insight:  Improving  Engagement in Therapy:  Improving  Modes of Intervention:  Clarification, Discussion, Exploration, Problem-solving and Support  Summary of Progress/Problems: Finding Balance in Life. Today's group focused on defining balance in one's own words, identifying things that can knock one off balance, and exploring healthy ways to maintain balance in life. Group members were asked to provide an example of a time when they felt off balance, describe how they handled that situation,and process healthier ways to regain balance in the future. Group members were asked to share the most important tool for maintaining balance that they learned while at [facility] and how they plan to apply this method after discharge.   Charisse MarchJeanna exhibited a depressed mood in group although her affect did brighten upon further discussion with her peers. She reported her life to currently be imbalanced due to her desire to provide care for others while subsequently not providing care for herself. Jeanna reflected upon her choice to always "put myself on the back burner" which leads to poor self care and exacerbated feelings of depression. She ended group demonstrating progressing insight as she reported her first step in regaining balance to be creating boundaries with others and by putting herself first in the future.   PICKETT JR, GREGORY C 01/16/2014, 3:12 PM
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
## 2014-01-16 NOTE — [facility] Group Notes (Signed)
Pt has been up and active in the milieu today. She rated her depression a 5 hopelessness 4 and her anxiety a 8 on her self-inventory.  She does admit to some passive S/I but no plans here in the hospital and contracts to come to staff before she acts on any thoughts. She denies any A/V/H.  [facility]

## 2014-01-17 DIAGNOSIS — F3160 Bipolar disorder, current episode mixed, unspecified: Secondary | ICD-10-CM

## 2014-01-17 MED ORDER — OXCARBAZEPINE 300 MG PO TABS
300.0000 mg | ORAL_TABLET | Freq: Two times a day (BID) | ORAL | Status: DC
  Administered 2014-01-17 – 2014-01-22 (×10): 300 mg via ORAL
  Filled 2014-01-17: qty 1
  Filled 2014-01-17 (×2): qty 8
  Filled 2014-01-17 (×12): qty 1

## 2014-01-17 NOTE — [facility] Group Notes (Signed)
[facility] LCSW Group Therapy  01/17/2014 2:25 PM  Type of Therapy:  Group Therapy  Participation Level:  Minimal  Participation Quality:  Attentive  Affect:  Depressed  Cognitive:  Alert and Oriented  Insight:  Improving  Engagement in Therapy:  Improving  Modes of Intervention:  Clarification, Confrontation, Discussion, Exploration, Problem-solving and Support  Summary of Progress/Problems: Feelings around Relapse. Group members discussed the meaning of relapse and shared personal stories of relapse, how it affected them and others, and how they perceived themselves during this time. Group members were encouraged to identify triggers, warning signs and coping skills used when facing the possibility of relapse. Social supports were discussed and explored in detail. Post Acute Withdrawal Syndrome (handout provided) was introduced and examined. Pt's were encouraged to ask questions, talk about key points associated with PAWS, and process this information in terms of relapse prevention.  Morgan [name] was initially observed to be in a reserved mood AEB minimal eye contact with peers. Her affect brightened throughout group discussion as she examined her triggers to her relapse in depression. Morgan [name] stated that she often has a mistrust for others which subsequently causes her to become more socially isolative. She processed feelings of disappointment and loneliness but demonstrated progressing insight as she verbalized the importance of refraining from unrealistic expectations such as overall perfection. Morgan [name] ended group in a positive yet reserved mood.      PICKETT JR, GREGORY C 01/17/2014, 2:25 PM

## 2014-01-17 NOTE — Progress Notes (Signed)
[facility]  01/17/2014 12:37 PM Morgan ProfferJenna A [name]  MRN:  [id number]  Subjective:  Patient seen chart reviewed.  The patient is 23 year old female who was admitted because of worsening of depression.  She admitted that her depression has been so worse but she cannot go like this.  The patient appears very labile and easily distracted.  She reported poor sleep.  She was started Trileptal 150 mg twice a day.  She reported no side effects of medication.   She has taken multiple medications in the past with limited response.  She endorsed nightmares , racing thoughts, poor sleep and appears very emotional.   however after some encouragement she is willing to try a higher dose of Trileptal.  She has no tremors or shakes.   Diagnosis:   DSM5: Total Time spent with patient: 20 minutes  Axis I: Bipolar, mixed Axis II: Deferred Axis III:  Past Medical History  Diagnosis Date  . Eczema   . TMJ (dislocation of temporomandibular joint)   . Depression   . ADHD (attention deficit hyperactivity disorder)   . PTSD (post-traumatic stress disorder)   . Anxiety   . Asthma    Axis IV: other psychosocial or environmental problems, problems related to social environment and problems with primary support group Axis V: 41-50 serious symptoms  ADL's:  Intact  Sleep: Poor  Appetite:  Fair  Suicidal Ideation:  Plan:  Denies Intent:  Denies Means:  None Homicidal Ideation:  Plan:  Denies Intent:  Denies Means:  None AEB (as evidenced by):  Psychiatric Specialty Exam: Physical Exam  ROS  Blood pressure 128/80, pulse 91, temperature 97.6 F (36.4 C), temperature source Oral, resp. rate 20, height 5' 6.25" (1.683 m), weight 219 lb (99.338 kg), last menstrual period 12/24/2013.Body mass index is 35.07 kg/(m^2).  General Appearance: Casual  Eye Contact::  Minimal  Speech:  Clear and Coherent  Volume:  Decreased  Mood:  Depressed and Euphoric  Affect:  Labile  Thought Process:   Circumstantial and Loose  Orientation:  Full (Time, Place, and Person)  Thought Content:  Rumination  Suicidal Thoughts:  Yes.  without intent/plan  Homicidal Thoughts:  No  Memory:  Immediate;   Fair Recent;   Fair Remote;   Fair  Judgement:  Intact  Insight:  Lacking  Psychomotor Activity:  Increased  Concentration:  Fair  Recall:  FiservFair  Fund of Knowledge:Fair  Language: Fair  Akathisia:  No  Handed:  Right  AIMS (if indicated):     Assets:  Communication Skills  Sleep:  Number of Hours: 5.25   Musculoskeletal: Strength & Muscle Tone: within normal limits Gait & Station: normal Patient leans: N/A  Current Medications: Current Facility-Administered Medications  Medication Dose Route Frequency Provider Last Rate Last Dose  . acetaminophen (TYLENOL) tablet 650 mg  650 mg Oral Q6H PRN Benjaman PottGerald D Taylor, MD   650 mg at 01/17/14 1137  . alum & mag hydroxide-simeth (MAALOX/MYLANTA) 200-200-20 MG/5ML suspension 30 mL  30 mL Oral Q4H PRN Benjaman PottGerald D Taylor, MD      . clonazePAM Scarlette Calico(KLONOPIN) tablet 0.5 mg  0.5 mg Oral QHS PRN Benjaman PottGerald D Taylor, MD      . escitalopram (LEXAPRO) tablet 5 mg  5 mg Oral Daily Rachael FeeIrving A Lugo, MD   5 mg at 01/17/14 0802  . feeding supplement (GLUCERNA SHAKE) (GLUCERNA SHAKE) liquid 237 mL  237 mL Oral TID BM Earna CoderHaley A Hawkins, RD   237 mL at 01/17/14 1002  .  loratadine (CLARITIN) tablet 10 mg  10 mg Oral Daily Benjaman PottGerald D Taylor, MD   10 mg at 01/17/14 0802  . magnesium hydroxide (MILK OF MAGNESIA) suspension 30 mL  30 mL Oral Daily PRN Benjaman PottGerald D Taylor, MD      . montelukast (SINGULAIR) tablet 10 mg  10 mg Oral QHS Benjaman PottGerald D Taylor, MD   10 mg at 01/16/14 2155  . nicotine (NICODERM CQ - dosed in mg/24 hours) patch 14 mg  14 mg Transdermal Q0600 Sanjuana KavaAgnes I Nwoko, NP   14 mg at 01/17/14 [id number]  . ondansetron (ZOFRAN-ODT) disintegrating tablet 4 mg  4 mg Oral Q8H PRN Benjaman PottGerald D Taylor, MD      . OXcarbazepine (TRILEPTAL) tablet 150 mg  150 mg Oral BID Rachael FeeIrving A Lugo, MD   150 mg at  01/17/14 0801  . prazosin (MINIPRESS) capsule 1 mg  1 mg Oral BID Benjaman PottGerald D Taylor, MD   1 mg at 01/17/14 0801  . zolpidem (AMBIEN) tablet 5 mg  5 mg Oral QHS PRN Rachael FeeIrving A Lugo, MD   5 mg at 01/16/14 2250    Lab Results: No results found for this or any previous visit (from the past 48 hour(s)).  Physical Findings: AIMS: Facial and Oral Movements Muscles of Facial Expression: None, normal Lips and Perioral Area: None, normal Jaw: None, normal Tongue: None, normal,Extremity Movements Upper (arms, wrists, hands, fingers): None, normal Lower (legs, knees, ankles, toes): None, normal, Trunk Movements Neck, shoulders, hips: None, normal, Overall Severity Severity of abnormal movements (highest score from questions above): None, normal Incapacitation due to abnormal movements: None, normal Patient's awareness of abnormal movements (rate only patient's report): No Awareness, Dental Status Current problems with teeth and/or dentures?: No Does patient usually wear dentures?: No  CIWA:    COWS:     Treatment Plan Summary: 1  Admit for crisis management and stabilization. 2.  Medication management to reduce symptoms to baseline and improved the patient's overall level of functioning.  Increase Trileptal 300 mg twice a day, Closely monitor the side effects, efficacy and therapeutic response of medication. 3.  Treat health problem as indicated. 4.  Developed treatment plan to decrease the risk of relapse upon discharge and to reduce the need for readmission. 5.  Psychosocial education regarding relapse prevention in self-care. 6.  Healthcare followup as needed for medical problems and called consults as indicated.   7.  Increase collateral information. 8.  Restart home medication where appropriate 9. Encouraged to participate and verbalize into group milieu therapy.  Plan:  Medical Decision Making Problem Points:  Established problem, worsening (2) and Review of psycho-social stressors  (1) Data Points:  Review or order clinical lab tests (1) Review of medication regiment & side effects (2) Review of new medications or change in dosage (2)  I certify that inpatient services furnished can reasonably be expected to improve the patient's condition.   ARFEEN,SYED T. 01/17/2014, 12:37 PM

## 2014-01-17 NOTE — Progress Notes (Signed)
Nursing Progress Note : D-  Patient presents with anxiety and depression,affect is blunted. Rates anxiety and depression at 6/10. Rates hopelessness at 7. C/o low back pain at 7,  pain goal is 3. Goal for today is to get medication before pain gets to bad and practice breathing exercises.  A- Support and Encouragement provided, Allowed patient to ventilate during 1:1. "I really want to get motivated and take better care of myself , I use to take yoga and eat right. "Discussed her frustration with unhealthy choices encouraged to practiced her breathing to decrease her anxiety and help managed her pain. Pt agreed to do so.  R- Will continue to monitor on q 15 minute checks for safety, compliant with medications and programming

## 2014-01-17 NOTE — Tx Team (Signed)
Interdisciplinary Treatment Plan Update (Adult)  Date: 01/17/2014  Time Reviewed:  9:45 AM  Progress in Treatment: Attending groups: Yes Participating in groups:  Yes Taking medication as prescribed:  Yes Tolerating medication:  Yes Family/Significant othe contact made: CSW assessing Patient understands diagnosis:  Yes Discussing patient identified problems/goals with staff:  Yes Medical problems stabilized or resolved:  Yes Denies suicidal/homicidal ideation: Yes Issues/concerns per patient self-inventory:  Yes Other:  New problem(s) identified: N/A  Discharge Plan or Barriers: CSW assessing for appropriate referrals.  [facility]   Reason for Continuation of Hospitalization: Anxiety Depression Medication Stabilization  Comments: N/A  Estimated length of stay: 3-5 days  For review of initial/current patient goals, please see plan of care.  Attendees: Patient:     Family:     Physician:  Dr. Lolly MustacheArfeen  01/17/2014 9:41 AM  Nursing:    01/17/2014 9:41 AM  Clinical Social Worker:  Reyes Ivanhelsea Horton, LCSW 01/17/2014 9:41 AM  Other: Brayton ElBritney, RN 01/17/2014 9:41 AM  Other:  Janann ColonelGregory Pickett Jr., LCSW 01/17/2014 9:41 AM  Other:  Serena ColonelAggie Nwoko, NP 01/17/2014 9:41 AM  Other:     Other:    Other:    Other:    Other:    Other:    Other:     Scribe for Treatment Team:

## 2014-01-17 NOTE — [facility] Group Notes (Signed)
[facility]   01/17/2014 10:31 AM  Participation Quality:  Active   Mood/Affect:  Depressed  Depression Rating:  5  Anxiety Rating:  8  Thoughts of Suicide:  No Will you contract for safety?   Yes  Current AVH:  No  Plan for Discharge/Comments:  [facility]   Transportation Means: By boyfriend  Supports: Boyfriend  TelfordPICKETT JR, [place]ullytownGREGORY C

## 2014-01-17 NOTE — Progress Notes (Signed)
D.  Pt bright and pleasant on approach, no complaints voiced.  Positive for evening wrap up group, interacting appropriately with peers on unit.  Denies SI/HI/hallucinations at this time.  A.  Support and encouragement offered  R.  Pt remains safe on unit, will continue to monitor.

## 2014-01-17 NOTE — [facility] Group Notes (Signed)
Adult Psychoeducational Group Note  Date:  01/17/2014 Time:  10:17 PM  Group Topic/Focus:  AA Meeting  Participation Level:  Minimal  Participation Quality:  Appropriate  Affect:  Flat  Cognitive:  Appropriate  Insight: Good  Engagement in Group:  Limited  Modes of Intervention:  Discussion and Education  Additional Comments:  Morgan StanfordJenna stated that she mainly wanted to listen but did explain what she was here for.  Morgan RancherLindsay, Morgan [name] 01/17/2014, 10:17 PM

## 2014-01-18 NOTE — [facility] Group Notes (Signed)
[facility] Group Notes:  (Nursing/MHT/Case Management/Adjunct)  Date:  01/18/2014  Time:  4:20 PM  Type of Therapy:  Psychoeducational Skills  Participation Level:  Active  Participation Quality:  Appropriate  Affect:  Appropriate  Cognitive:  Appropriate  Insight:  Appropriate  Engagement in Group:  Engaged  Modes of Intervention:  Discussion  Summary of Progress/Problems: self esteem Psychoeducational group. Pt gave appropriate examples of positive and negative self esteem descriptive words and also gave positive feedback when listening to others depiction on self esteem  Kellie Moorerry, Adeja Sarratt M 01/18/2014, 4:20 PM

## 2014-01-18 NOTE — Progress Notes (Signed)
Adult Psychoeducational Group Note  Date:  01/18/2014 Time:  3:29 PM  Group Topic/Focus:  Therapeutic Activity  Participation Level:  Active  Participation Quality:  Attentive, Sharing and Supportive  Affect:  Appropriate  Cognitive:  Appropriate  Insight: Appropriate  Engagement in Group:  Engaged and Supportive  Modes of Intervention:  Activity, Socialization and Support   Elijio MilesMercer, Brittany N 01/18/2014, 3:29 PM

## 2014-01-18 NOTE — [facility] Group Notes (Addendum)
[facility] Group Notes:  (Nursing/MHT/Case Management/Adjunct)  Date:  01/18/2014  Time:  2:07 PM  Type of Therapy:  Psychoeducational Skills  Participation Level:  Active  Participation Quality:  Appropriate  Affect:  Appropriate  Cognitive:  Appropriate  Insight:  Appropriate  Engagement in Group:  Engaged  Modes of Intervention:  Discussion  Summary of Progress/Problems: Pt did attend self inventory group, pt reported that she was negative SI/HI, no AH/VH noted. Pt rated her depression as a 4, and her helplessness/hopelessness as a 4.     Pt reported concerns about restless legs syndrome, pt advised that the doctor will be made aware.    Morgan [name], Morgan [name] 01/18/2014, 2:07 PM

## 2014-01-18 NOTE — Progress Notes (Signed)
D. Pt pleasant and bright on approach, denies complaints at this time.  Denies SI/HI/hallucinations at this time.  Positive for evening AA group, interacting appropriately with peers on unit.  Much happier since Pt has new roommate, was intimidated by previous one.  A.  Support and encouragement offered  R.  Pt remains safe on unit, will continue to monitor.

## 2014-01-18 NOTE — [facility] Group Notes (Signed)
[facility] Group Notes:  (Clinical Social Work)  01/18/2014     10-11AM  Summary of Progress/Problems:   The main focus of today's process group was for the patient to identify ways in which they have in the past sabotaged their own recovery. Motivational Interviewing and a worksheet were utilized to help patients explore in depth the perceived benefits and costs of their substance use, as well as the potential benefits and costs of stopping.  The Stages of Change were explained using a handout, with an emphasis on making plans to deal with sabotaging behaviors proactively.  The patient expressed that her self-sabotaging behavior is being a people pleaser in order to get respect.  She followed along the entire discussion carefully and participated extensively, seemed to have several "aha moments" during group.  Type of Therapy:  Group Therapy - Process   Participation Level:  Active  Participation Quality:  Attentive, Sharing and Supportive  Affect:  Anxious and Appropriate  Cognitive:  Appropriate and Oriented  Insight:  Engaged  Engagement in Therapy:  Engaged  Modes of Intervention:  Education, Support and Processing, Motivational Interviewing  Pilgrim's PrideMareida Grossman-Orr, LCSW 01/18/2014, 11:10 AM

## 2014-01-18 NOTE — Progress Notes (Signed)
Patient ID: Morgan [name], female   DOB: 6/12[name]/1992, 23 y.o.   MRN: [id number]   D: Pt has been very flat and depressed on the unit today, patient [facility]. Pt reported that she she could not even go in her room with out having to deal with a attitude. Pt was relieved when her roommate was moved to another hall. Pt reported that her only issue now was restless legs, pt advised that she would inform doctor of situation. Pt attended all groups and engaged in treatment. Pt reported being negative SI/HI, no AH/VH noted. A: 15 min checks continued for patient safety. R: Pt safety maintained.

## 2014-01-18 NOTE — Progress Notes (Addendum)
[facility]  01/18/2014 8:25 AM Morgan [name]  MRN:  [id number]  Subjective:  Patient seen chart reviewed.  The patient is 23 year old female who was admitted because of worsening of depression. She admitted that her depression has been so worse but she cannot go like this. Pt states she is doing pretty good, however she is still having trouble sleeping. Reports sleeping in the quiet room yesterday, so she wouldn't have to hear her roommate(snores loud, coughing, and vomiting).  She was started Trileptal 150 mg twice a day.  She reported no side effects of medication. Notes improvement in her sleep, just has a problem staying asleep. Rates her depression at a 5-6/10, and anxiety 5-6/10. Denies SI/HI/AVH. Upon discharge patient plans to go back home, she has a boyfriend and a very stable environment. She plans to attend AA/NA meetings, because they are helping her a lot. She wants to get back on track and get a job so she doesn't have to rely on her boyfriend, to take care of her.   Diagnosis:   DSM5: Total Time spent with patient: 20 minutes  Axis I: Bipolar, mixed Axis II: Deferred Axis III:  Past Medical History  Diagnosis Date  . Eczema   . TMJ (dislocation of temporomandibular joint)   . Depression   . ADHD (attention deficit hyperactivity disorder)   . PTSD (post-traumatic stress disorder)   . Anxiety   . Asthma    Axis IV: other psychosocial or environmental problems, problems related to social environment and problems with primary support group Axis V: 41-50 serious symptoms  ADL'[name]:  Intact  Sleep: Fair  Appetite:  better  Suicidal Ideation:  Plan:  Denies Intent:  Denies Means:  None Homicidal Ideation:  Plan:  Denies Intent:  Denies Means:  None AEB (as evidenced by):  Psychiatric Specialty Exam: Physical Exam  Constitutional: She is oriented to person, place, and time. She appears well-developed and well-nourished.  HENT:  Head: Normocephalic.   Eyes: Pupils are equal, round, and reactive to light.  Neck: Normal range of motion.  Neurological: She is alert and oriented to person, place, and time.  Skin: Skin is warm and dry.    Review of Systems  Constitutional: Positive for weight loss (35lbs per patient) and malaise/fatigue.  Psychiatric/Behavioral: Positive for depression, suicidal ideas and substance abuse. Negative for hallucinations. The patient is nervous/anxious and has insomnia.     Blood pressure 131/79, pulse 108, temperature 98.1 F (36.7 C), temperature source Oral, resp. rate 20, height 5' 6.25" (1.683 m), weight 99.338 kg (219 lb), last menstrual period 12/24/2013.Body mass index is 35.07 kg/(m^2).  General Appearance: Casual  Eye Contact::  Fair  Speech:  Clear and Coherent  Volume:  Normal  Mood:  Depressed and Euphoric  Affect:  Labile  Thought Process:  Circumstantial and Loose  Orientation:  Full (Time, Place, and Person)  Thought Content:  Rumination  Suicidal Thoughts:  No  Homicidal Thoughts:  No  Memory:  Immediate;   Fair Recent;   Fair Remote;   Fair  Judgement:  Intact  Insight:  Lacking  Psychomotor Activity:  Increased  Concentration:  Fair  Recall:  F[employer]ervFair  Fund of Knowledge:Fair  Language: Fair  Akathisia:  No  Handed:  Right  AIMS (if indicated):     Assets:  Communication Skills  Sleep:  Number of Hours: 6.75   Musculoskeletal: Strength & Muscle Tone: within normal limits Gait & Station: normal Patient leans: N/A  Current Medications:  Current Facility-Administered Medications  Medication Dose Route Frequency Provider Last Rate Last Dose  . acetaminophen (TYLENOL) tablet 650 mg  650 mg Oral Q6H PRN Benjaman Pott, MD   650 mg at 01/17/14 1137  . alum & mag hydroxide-simeth (MAALOX/MYLANTA) 200-200-20 MG/5ML suspension 30 mL  30 mL Oral Q4H PRN Benjaman Pott, MD      . clonazePAM Scarlette Calico) tablet 0.5 mg  0.5 mg Oral QHS PRN Benjaman Pott, MD   0.5 mg at 01/17/14 2210  .  escitalopram (LEXAPRO) tablet 5 mg  5 mg Oral Daily Rachael Fee, MD   5 mg at 01/18/14 0746  . feeding supplement (GLUCERNA SHAKE) (GLUCERNA SHAKE) liquid 237 mL  237 mL Oral TID BM Earna Coder, RD   237 mL at 01/18/14 0746  . loratadine (CLARITIN) tablet 10 mg  10 mg Oral Daily Benjaman Pott, MD   10 mg at 01/18/14 0746  . magnesium hydroxide (MILK OF MAGNESIA) suspension 30 mL  30 mL Oral Daily PRN Benjaman Pott, MD      . montelukast (SINGULAIR) tablet 10 mg  10 mg Oral QHS Benjaman Pott, MD   10 mg at 01/17/14 2210  . nicotine (NICODERM CQ - dosed in mg/24 hours) patch 14 mg  14 mg Transdermal Q0600 Sanjuana Kava, NP   14 mg at 01/18/14 0607  . ondansetron (ZOFRAN-ODT) disintegrating tablet 4 mg  4 mg Oral Q8H PRN Benjaman Pott, MD      . Oxcarbazepine (TRILEPTAL) tablet 300 mg  300 mg Oral BID Cleotis Nipper, MD   300 mg at 01/18/14 0746  . prazosin (MINIPRESS) capsule 1 mg  1 mg Oral BID Benjaman Pott, MD   1 mg at 01/18/14 0746  . zolpidem (AMBIEN) tablet 5 mg  5 mg Oral QHS PRN Rachael Fee, MD   5 mg at 01/17/14 2210    Lab Results: No results found for this or any previous visit (from the past 48 hour([name])).  Physical Findings: AIMS: Facial and Oral Movements Muscles of Facial Expression: None, normal Lips and Perioral Area: None, normal Jaw: None, normal Tongue: None, normal,Extremity Movements Upper (arms, wrists, hands, fingers): None, normal Lower (legs, knees, ankles, toes): None, normal, Trunk Movements Neck, shoulders, hips: None, normal, Overall Severity Severity of abnormal movements (highest score from questions above): None, normal Incapacitation due to abnormal movements: None, normal Patient'[name] awareness of abnormal movements (rate only patient'[name] report): No Awareness, Dental Status Current problems with teeth and/or dentures?: No Does patient usually wear dentures?: No  CIWA:  CIWA-Ar Total: 1 COWS:     Treatment Plan Summary: 1  Admit for crisis  management and stabilization. 2.  Medication management to reduce symptoms to baseline and improved the patient'[name] overall level of functioning.  Increase Trileptal 300 mg twice a day, Closely monitor the side effects, efficacy and therapeutic response of medication. 3.  Treat health problem as indicated. 4.  Developed treatment plan to decrease the risk of relapse upon discharge and to reduce the need for readmission. 5.  Psychosocial education regarding relapse prevention in self-care. 6.  Healthcare followup as needed for medical problems and called consults as indicated.   7.  Increase collateral information. 8.  Restart home medication where appropriate 9. Encouraged to participate and verbalize into group milieu therapy.  Medical Decision Making Problem Points:  Established problem, worsening (2) and Review of psycho-social stressors (1) Data Points:  Review or order clinical  lab tests (1) Review of medication regiment & side effects (2) Review of new medications or change in dosage (2)  I certify that inpatient services furnished can reasonably be expected to improve the patient'[name] condition.   Morgan [name], Morgan [name] 01/18/2014, 8:25 AM  Reviewed the information documented and agree with the treatment plan.  Morgan [name],Morgan [name]. 01/21/2014 9:43 AM

## 2014-01-18 NOTE — Progress Notes (Signed)
Patient did attend the evening speaker AA meeting.  

## 2014-01-19 MED ORDER — PRAMIPEXOLE DIHYDROCHLORIDE 0.125 MG PO TABS
0.1250 mg | ORAL_TABLET | Freq: Every day | ORAL | Status: DC
  Administered 2014-01-19 – 2014-01-20 (×2): 0.125 mg via ORAL
  Filled 2014-01-19 (×4): qty 1

## 2014-01-19 NOTE — Progress Notes (Signed)
Patient did attend the evening speaker AA meeting.  

## 2014-01-19 NOTE — Progress Notes (Signed)
Patient ID: [name], female   DOB: 01/03/1991, 23 y.o.   MRN: [id number] D: Patient reports decreased depressive symptoms, rating her depression and hopelessness as a 4.  She reports passive suicidal ideations, however, contracts for safety.  She reports fair sleep, good appetite and low energy.  She is complaining of restless legs and requesting medication for same.  Patient is attending groups and participating in her treatment.  Her goal is to attend 12 step meetings and continue to take her medications.  She denies HI/AVH.  A: Continue to monitor medication management and MD orders.  Safety checks completed every 15 minutes per protocol.  R: Patient is receptive to staff; her behavior is appropriate for situation.

## 2014-01-19 NOTE — Progress Notes (Signed)
D.  Pt bright and pleasant on approach, denies complaints at this time.  Positive for evening AA group, interacting appropriately with peers on unit.  Very pleased to learn she has medication ordered for restless leg syndrome and hopeful it will help her tonight.  Denies SI/HI/hallucinations at this time.  A.  Support and encouragement offered  R.  Pt remains safe on unit, will continue to monitor.

## 2014-01-19 NOTE — [facility] Group Notes (Signed)
[facility] Group Notes:  (Nursing/MHT/Case Management/Adjunct)  Date:  01/19/2014  Time: 0900 am  Type of Therapy: Self Inventory Group  Participation Level:  Active  Participation Quality:  Appropriate  Affect:  Appropriate  Cognitive:  Alert and Appropriate  Insight:  Good  Engagement in Group:  Engaged  Modes of Intervention:  Support  Summary of Progress/Problems: Mackinze [facility].  She also wants medication for restless leg syndrome.  Cranford MonBeaudry, Caroline Evans 01/19/2014, 9:46 AM

## 2014-01-19 NOTE — Progress Notes (Addendum)
[facility]  01/19/2014 1:29 PM Galvin ProfferJenna A Rise  MRN:  [id number]  Subjective:  Patient seen chart reviewed. Pt states she is doing pretty good, and she reports a good night rest. Today is the first day that she has not had one sucidal thought since she was in puberty. however she is still having trouble sleeping. She reports compliance with medication, and denies any side effects at this time. She is taking initiative in her treatment plan by attending group, and making phone calls for placement. She is thankful that she is not on benzo's during her treatment course here. She reports having a CPAP at home, and diagnosed with restless leg syndrome. She is requesting treatment for restless leg at this time. Rates her depression at a 4/10, and anxiety 2/10. Denies SI/HI/AVH. Upon discharge patient plans to go back home, she has a boyfriend and a very stable environment. She plans to attend AA/NA meetings, because they are helping her a lot. She wants to get back on track and get a job so she doesn't have to rely on her boyfriend, to take care of her.    Diagnosis:   DSM5: Total Time spent with patient: 20 minutes  Axis I: Bipolar, mixed Axis II: Deferred Axis III:  Past Medical History  Diagnosis Date  . Eczema   . TMJ (dislocation of temporomandibular joint)   . Depression   . ADHD (attention deficit hyperactivity disorder)   . PTSD (post-traumatic stress disorder)   . Anxiety   . Asthma    Axis IV: other psychosocial or environmental problems, problems related to social environment and problems with primary support group Axis V: 41-50 serious symptoms  ADL's:  Intact  Sleep: Fair  Appetite:  better  Suicidal Ideation:  Plan:  Denies Intent:  Denies Means:  None Homicidal Ideation:  Plan:  Denies Intent:  Denies Means:  None AEB (as evidenced by):  Psychiatric Specialty Exam: Physical Exam  Constitutional: She is oriented to person, place, and time. She appears  well-developed and well-nourished.  HENT:  Head: Normocephalic.  Eyes: Pupils are equal, round, and reactive to light.  Neck: Normal range of motion.  Neurological: She is alert and oriented to person, place, and time.  Skin: Skin is warm and dry.    Review of Systems  Constitutional: Positive for weight loss (35lbs per patient) and malaise/fatigue.  Psychiatric/Behavioral: Positive for depression, suicidal ideas and substance abuse. Negative for hallucinations. The patient is nervous/anxious and has insomnia.     Blood pressure 121/75, pulse 92, temperature 97.8 F (36.6 C), temperature source Oral, resp. rate 18, height 5' 6.25" (1.683 m), weight 99.338 kg (219 lb), last menstrual period 12/24/2013.Body mass index is 35.07 kg/(m^2).  General Appearance: Casual  Eye Contact::  Fair  Speech:  Clear and Coherent  Volume:  Normal  Mood:  Euphoric  Affect:  Depressed and Flat  Thought Process:  Circumstantial and Loose  Orientation:  Full (Time, Place, and Person)  Thought Content:  Rumination  Suicidal Thoughts:  No  Homicidal Thoughts:  No  Memory:  Immediate;   Fair Recent;   Fair Remote;   Fair  Judgement:  Intact  Insight:  Lacking  Psychomotor Activity:  Increased  Concentration:  Fair  Recall:  FiservFair  Fund of Knowledge:Fair  Language: Fair  Akathisia:  No  Handed:  Right  AIMS (if indicated):     Assets:  Communication Skills  Sleep:  Number of Hours: 5.75   Musculoskeletal: Strength &  Muscle Tone: within normal limits Gait & Station: normal Patient leans: N/A  Current Medications: Current Facility-Administered Medications  Medication Dose Route Frequency Alaric Gladwin Last Rate Last Dose  . acetaminophen (TYLENOL) tablet 650 mg  650 mg Oral Q6H PRN Benjaman PottGerald D Taylor, MD   650 mg at 01/18/14 1315  . alum & mag hydroxide-simeth (MAALOX/MYLANTA) 200-200-20 MG/5ML suspension 30 mL  30 mL Oral Q4H PRN Benjaman PottGerald D Taylor, MD      . clonazePAM Scarlette Calico(KLONOPIN) tablet 0.5 mg  0.5 mg  Oral QHS PRN Benjaman PottGerald D Taylor, MD   0.5 mg at 01/18/14 2117  . escitalopram (LEXAPRO) tablet 5 mg  5 mg Oral Daily Rachael FeeIrving A Lugo, MD   5 mg at 01/19/14 0844  . feeding supplement (GLUCERNA SHAKE) (GLUCERNA SHAKE) liquid 237 mL  237 mL Oral TID BM Earna CoderHaley A Hawkins, RD   237 mL at 01/18/14 1953  . loratadine (CLARITIN) tablet 10 mg  10 mg Oral Daily Benjaman PottGerald D Taylor, MD   10 mg at 01/19/14 0844  . magnesium hydroxide (MILK OF MAGNESIA) suspension 30 mL  30 mL Oral Daily PRN Benjaman PottGerald D Taylor, MD      . montelukast (SINGULAIR) tablet 10 mg  10 mg Oral QHS Benjaman PottGerald D Taylor, MD   10 mg at 01/18/14 2117  . nicotine (NICODERM CQ - dosed in mg/24 hours) patch 14 mg  14 mg Transdermal Q0600 Sanjuana KavaAgnes I Nwoko, NP   14 mg at 01/19/14 0609  . ondansetron (ZOFRAN-ODT) disintegrating tablet 4 mg  4 mg Oral Q8H PRN Benjaman PottGerald D Taylor, MD      . Oxcarbazepine (TRILEPTAL) tablet 300 mg  300 mg Oral BID Cleotis NipperSyed T Arfeen, MD   300 mg at 01/19/14 0844  . prazosin (MINIPRESS) capsule 1 mg  1 mg Oral BID Benjaman PottGerald D Taylor, MD   1 mg at 01/19/14 0844  . zolpidem (AMBIEN) tablet 5 mg  5 mg Oral QHS PRN Rachael FeeIrving A Lugo, MD   5 mg at 01/18/14 2117    Lab Results: No results found for this or any previous visit (from the past 48 hour(s)).  Physical Findings: AIMS: Facial and Oral Movements Muscles of Facial Expression: None, normal Lips and Perioral Area: None, normal Jaw: None, normal Tongue: None, normal,Extremity Movements Upper (arms, wrists, hands, fingers): None, normal Lower (legs, knees, ankles, toes): None, normal, Trunk Movements Neck, shoulders, hips: None, normal, Overall Severity Severity of abnormal movements (highest score from questions above): None, normal Incapacitation due to abnormal movements: None, normal Patient's awareness of abnormal movements (rate only patient's report): No Awareness, Dental Status Current problems with teeth and/or dentures?: No Does patient usually wear dentures?: No  CIWA:  CIWA-Ar  Total: 0 COWS:     Treatment Plan Summary: 1  Admit for crisis management and stabilization. 2.  Medication management to reduce symptoms to baseline and improved the patient's overall level of functioning.  Increase Trileptal 300 mg twice a day, Closely monitor the side effects, efficacy and therapeutic response of medication.  3.  Treat health problem as indicated. Will treat restless leg syndrome to promote better sleep during her stay, Mirapex 0.125mg  1 tablet po QHS increase Q3days or until symptoms have resolved.  4.  Developed treatment plan to decrease the risk of relapse upon discharge and to reduce the need for readmission. 5.  Psychosocial education regarding relapse prevention in self-care. 6.  Healthcare followup as needed for medical problems and called consults as indicated.   7.  Increase collateral  information. 8.  Restart home medication where appropriate 9. Encouraged to participate and verbalize into group milieu therapy.  Medical Decision Making Problem Points:  Established problem, worsening (2) and Review of psycho-social stressors (1) Data Points:  Review or order clinical lab tests (1) Review of medication regiment & side effects (2) Review of new medications or change in dosage (2)  I certify that inpatient services furnished can reasonably be expected to improve the patient's condition.   Malachy Chamber S 01/19/2014, 1:29 PM  Reviewed the information documented and agree with the treatment plan.  JONNALAGADDA,JANARDHAHA R. 01/21/2014 9:45 AM

## 2014-01-19 NOTE — [facility] Group Notes (Signed)
[facility] Group Notes:  (Clinical Social Work)  01/19/2014  10:00-11:00AM  Summary of Progress/Problems:   The main focus of today's process group was to   identify the patient's current support system and decide on other supports that can be put in place.  The picture on workbook was used to discuss why additional supports are needed.  An emphasis was placed on using counselor, doctor, therapy groups, 12-step groups, and problem-specific support groups to expand supports.   There was also an extensive discussion about what constitutes a healthy support versus an unhealthy support.  The patient expressed full comprehension of the concepts presented, and agreed that there is a need to add more supports.  The patient stated the current supports in place are God, her boyfriend, her new friends at the hospital.  Her plan is to go to NA and develop a support system there.  She was very encouraging to others throughout group, sharing her experiences of her parents staying together for the sake of the children, but saying that it would have been healthier for them to have divorced.  Type of Therapy:  Process Group with Motivational Interviewing  Participation Level:  Active  Participation Quality:  Attentive, Sharing and Supportive  Affect:  Blunted  Cognitive:  Alert and Oriented  Insight:  Engaged  Engagement in Therapy:  Engaged  Modes of Intervention:   Education, Support and Processing, Activity  Pilgrim's PrideMareida Grossman-Orr, LCSW 01/19/2014, 12:15pm

## 2014-01-20 MED ORDER — ESCITALOPRAM OXALATE 10 MG PO TABS
10.0000 mg | ORAL_TABLET | Freq: Every day | ORAL | Status: DC
  Administered 2014-01-21 – 2014-01-22 (×2): 10 mg via ORAL
  Filled 2014-01-20: qty 1
  Filled 2014-01-20: qty 4
  Filled 2014-01-20 (×2): qty 1

## 2014-01-20 MED ORDER — ARIPIPRAZOLE 2 MG PO TABS
2.0000 mg | ORAL_TABLET | Freq: Every day | ORAL | Status: DC
  Administered 2014-01-20 – 2014-01-22 (×3): 2 mg via ORAL
  Filled 2014-01-20: qty 4
  Filled 2014-01-20 (×5): qty 1

## 2014-01-20 NOTE — Progress Notes (Signed)
Adult Psychoeducational Group Note  Date:  01/20/2014 Time:  9:44 PM  Group Topic/Focus:  Wrap-Up Group:   The focus of this group is to help patients review their daily goal of treatment and discuss progress on daily workbooks.  Participation Level:  Active  Participation Quality:  Appropriate and Attentive  Affect:  Appropriate  Cognitive:  Alert  Insight: Good  Engagement in Group:  Supportive  Modes of Intervention:  Activity  Additional Comments:  PT was in group and was engaged in the group discussion. Pt was very supportive and helped keep a warm atmosphere.   Pettress, Delton R 01/20/2014, 9:44 PM

## 2014-01-20 NOTE — Progress Notes (Signed)
Adult Psychoeducational Group Note  Date:  01/20/2014 Time:  11:11 AM  Group Topic/Focus:  Dimensions of Wellness:   The focus of this group is to introduce the topic of wellness and discuss the role each dimension of wellness plays in total health.  Participation Level:  Active  Participation Quality:  Appropriate and Attentive  Affect:  Appropriate  Cognitive:  Alert and Appropriate  Insight: Good and Improving  Engagement in Group:  Engaged and Improving  Modes of Intervention:  Discussion  Additional Comments:  Pt stated she had already begun working on completing the Wellness goals. Under spiritual she wants to work on more prayer in her life.   Reynolds BowlClement, Saphyra Hutt D 01/20/2014, 11:11 AM

## 2014-01-20 NOTE — Progress Notes (Signed)
[facility]  01/20/2014 2:49 PM Morgan ProfferJenna A [name]  MRN:  [id number] Subjective:  Morgan StanfordJenna endorses that she will like to have some more help for her depression, the ruminating, the feeling uncomfortable around people, the drastic mood swings. States she tries to avoid eye to eye contact, he stays to herself. States she is not sure if she is going to ever be able to have a normal life. States she has been struggling with this since she was really young ant the she sometimes thinks it is not going to get any better. Asks about ECT Diagnosis:   DSM5: Schizophrenia Disorders:  none Obsessive-Compulsive Disorders:  none Trauma-Stressor Disorders:  Posttraumatic Stress Disorder (309.81) Substance/Addictive Disorders:  None Depressive Disorders:  Major Depressive Disorder - Moderate (296.22) Total Time spent with patient: 30 minutes  Axis I: ADHD, combined type and Bipolar, Depressed  ADL's:  Intact  Sleep: Poor  Appetite:  Fair  Suicidal Ideation:  Plan:  denies Intent:  denies Means:  denies Homicidal Ideation:  Plan:  denies Intent:  denies Means:  denies AEB (as evidenced by):  Psychiatric Specialty Exam: Physical Exam  Review of Systems  Constitutional: Negative.   HENT: Negative.   Eyes: Negative.   Respiratory: Negative.   Cardiovascular: Negative.   Gastrointestinal: Negative.   Genitourinary: Negative.   Musculoskeletal: Negative.   Skin: Negative.   Neurological: Negative.   Endo/Heme/Allergies: Negative.   Psychiatric/Behavioral: Positive for depression. The patient is nervous/anxious and has insomnia.     Blood pressure 117/75, pulse 91, temperature 97.5 F (36.4 C), temperature source Oral, resp. rate 16, height 5' 6.25" (1.683 m), weight 99.338 kg (219 lb), last menstrual period 12/24/2013.Body mass index is 35.07 kg/(m^2).  General Appearance: Fairly Groomed  Patent attorneyye Contact::  Fair  Speech:  Clear and Coherent  Volume:  Decreased  Mood:  Anxious,  Depressed and worried  Affect:  anxious, worried, concerned  Thought Process:  Coherent and Goal Directed  Orientation:  Full (Time, Place, and Person)  Thought Content:  symtpoms, worries, concerns  Suicidal Thoughts:  No  Homicidal Thoughts:  No  Memory:  Immediate;   Fair Recent;   Fair Remote;   Fair  Judgement:  Fair  Insight:  Present  Psychomotor Activity:  Restlessness  Concentration:  Fair  Recall:  FiservFair  Fund of Knowledge:NA  Language: Fair  Akathisia:  No  Handed:    AIMS (if indicated):     Assets:  Desire for Improvement  Sleep:  Number of Hours: 4   Musculoskeletal: Strength & Muscle Tone: within normal limits Gait & Station: normal Patient leans: N/A  Current Medications: Current Facility-Administered Medications  Medication Dose Route Frequency Provider Last Rate Last Dose  . acetaminophen (TYLENOL) tablet 650 mg  650 mg Oral Q6H PRN Benjaman PottGerald D Taylor, MD   650 mg at 01/18/14 1315  . alum & mag hydroxide-simeth (MAALOX/MYLANTA) 200-200-20 MG/5ML suspension 30 mL  30 mL Oral Q4H PRN Benjaman PottGerald D Taylor, MD      . ARIPiprazole (ABILIFY) tablet 2 mg  2 mg Oral Daily Rachael FeeIrving A Lugo, MD   2 mg at 01/20/14 1429  . clonazePAM (KLONOPIN) tablet 0.5 mg  0.5 mg Oral QHS PRN Benjaman PottGerald D Taylor, MD   0.5 mg at 01/19/14 2120  . [START ON 01/21/2014] escitalopram (LEXAPRO) tablet 10 mg  10 mg Oral Daily Rachael FeeIrving A Lugo, MD      . feeding supplement (GLUCERNA SHAKE) (GLUCERNA SHAKE) liquid 237 mL  237 mL Oral  TID BM Earna CoderHaley A Hawkins, RD   237 mL at 01/20/14 1324  . loratadine (CLARITIN) tablet 10 mg  10 mg Oral Daily Benjaman PottGerald D Taylor, MD   10 mg at 01/20/14 0817  . magnesium hydroxide (MILK OF MAGNESIA) suspension 30 mL  30 mL Oral Daily PRN Benjaman PottGerald D Taylor, MD      . montelukast (SINGULAIR) tablet 10 mg  10 mg Oral QHS Benjaman PottGerald D Taylor, MD   10 mg at 01/19/14 2120  . nicotine (NICODERM CQ - dosed in mg/24 hours) patch 14 mg  14 mg Transdermal Q0600 Sanjuana KavaAgnes I Nwoko, NP   14 mg at 01/20/14 0607   . ondansetron (ZOFRAN-ODT) disintegrating tablet 4 mg  4 mg Oral Q8H PRN Benjaman PottGerald D Taylor, MD      . Oxcarbazepine (TRILEPTAL) tablet 300 mg  300 mg Oral BID Cleotis NipperSyed T Arfeen, MD   300 mg at 01/20/14 0817  . pramipexole (MIRAPEX) tablet 0.125 mg  0.125 mg Oral QHS Truman Haywardakia S Starkes, FNP   0.125 mg at 01/19/14 2132  . prazosin (MINIPRESS) capsule 1 mg  1 mg Oral BID Benjaman PottGerald D Taylor, MD   1 mg at 01/20/14 [id number]  . zolpidem (AMBIEN) tablet 5 mg  5 mg Oral QHS PRN Rachael FeeIrving A Lugo, MD   5 mg at 01/19/14 2120    Lab Results: No results found for this or any previous visit (from the past 48 hour(s)).  Physical Findings: AIMS: Facial and Oral Movements Muscles of Facial Expression: None, normal Lips and Perioral Area: None, normal Jaw: None, normal Tongue: None, normal,Extremity Movements Upper (arms, wrists, hands, fingers): None, normal Lower (legs, knees, ankles, toes): None, normal, Trunk Movements Neck, shoulders, hips: None, normal, Overall Severity Severity of abnormal movements (highest score from questions above): None, normal Incapacitation due to abnormal movements: None, normal Patient's awareness of abnormal movements (rate only patient's report): No Awareness, Dental Status Current problems with teeth and/or dentures?: No Does patient usually wear dentures?: No  CIWA:  CIWA-Ar Total: 0 COWS:     Treatment Plan Summary: Daily contact with patient to assess and evaluate symptoms and progress in treatment Medication management  Plan: Supportive approach/coping skills/CBT           Increase the Lexapro to 10 mg daily           Add Abilify 2 mg daily to augment and help with the ruminative thinking            Medical Decision Making Problem Points:  Review of psycho-social stressors (1) Data Points:  Review of medication regiment & side effects (2) Review of new medications or change in dosage (2)  I certify that inpatient services furnished can reasonably be expected to improve the  patient's condition.   LUGO,IRVING A 01/20/2014, 2:49 PM

## 2014-01-20 NOTE — [facility] Group Notes (Signed)
[facility]   01/20/2014 9:50 AM  Participation Quality:  Minimal, pt was drowsy  Mood/Affect:  Lethargic  Depression Rating:  5  Anxiety Rating:  8  Thoughts of Suicide:  No Will you contract for safety?   Yes  Current AVH:  No  Plan for Discharge/Comments:  [facility]  Transportation Means: Boyfriend  Supports: Boyfriend   Mon[place] CityPICKETT JR, RainierGREGORY C

## 2014-01-20 NOTE — [facility] Group Notes (Signed)
[facility] LCSW Group Therapy  01/20/2014 2:53 PM  Type of Therapy and Topic:  Group Therapy:  Overcoming Obstacles  Participation Level:  Minimal with Depressed Mood   Description of Group:    In this group patients will be encouraged to explore what they see as obstacles to their own wellness and recovery. They will be guided to discuss their thoughts, feelings, and behaviors related to these obstacles. The group will process together ways to cope with barriers, with attention given to specific choices patients can make. Each patient will be challenged to identify changes they are motivated to make in order to overcome their obstacles. This group will be process-oriented, with patients participating in exploration of their own experiences as well as giving and receiving support and challenge from other group members.  Therapeutic Goals: 1. Patient will identify personal and current obstacles as they relate to admission. 2. Patient will identify barriers that currently interfere with their wellness or overcoming obstacles.  3. Patient will identify feelings, thought process and behaviors related to these barriers. 4. Patient will identify two changes they are willing to make to overcome these obstacles:    Summary of Patient Progress Morgan [name] provided minimal engagement within group and was observed to exhibit a depressed affect. She reported that her current obstacle is herself, stating that she often has difficulty with asking for help when she truly needs it. Morgan [name] was unable to specify why she feels as if she cannot ask for assistance; however she was able to identified the importance of trusting others as a means of improving her emotional support going forward. Morgan [name] ended group exhibiting an euthymic mood with increasing motivation to overcome her obstacle of depression and social isolation.      Therapeutic Modalities:   Cognitive Behavioral Therapy Solution Focused Therapy Motivational  Interviewing Relapse Prevention Therapy   Morgan [name], Morgan [name] 01/20/2014, 2:53 PM

## 2014-01-20 NOTE — Progress Notes (Signed)
Patient ID: Morgan ProfferJenna A Mcgurn, female   DOB: [birth year], 23 y.o.   MRN: [id number] She has been up and to groups interacting more with peers and staff. Self inventory: depressed and hopeless at 3. Withdrawals of craving and agitation She denies SI though's  and  Pain.

## 2014-01-21 MED ORDER — ZOLPIDEM TARTRATE 10 MG PO TABS
10.0000 mg | ORAL_TABLET | Freq: Every evening | ORAL | Status: DC | PRN
  Administered 2014-01-21: 10 mg via ORAL
  Filled 2014-01-21: qty 1

## 2014-01-21 MED ORDER — PRAMIPEXOLE DIHYDROCHLORIDE 0.25 MG PO TABS
0.2500 mg | ORAL_TABLET | Freq: Every day | ORAL | Status: DC
  Administered 2014-01-21: 0.25 mg via ORAL
  Filled 2014-01-21: qty 1
  Filled 2014-01-21: qty 4
  Filled 2014-01-21: qty 1

## 2014-01-21 NOTE — Progress Notes (Signed)
Patient ID: [name], female   DOB: 10/05/1990, 23 y.o.   MRN: [id number] D-Patient reports her sleep was poor and says "that is one of my problems".  She says her depression "Is much better than when I come in" She says her energy level is low and her ability to pay attention is improving.  She rates her depression at 5/10 and hopelessness at 5/10.  She denied any suicidal thoughts this am.  S- Supported patient.  She has started her menstrual period this AM and attributes feeling poorly to this.  She is still attending and participating in groups.  She talked with MD about her restlessness at night.

## 2014-01-21 NOTE — Progress Notes (Signed)
Recreation Therapy Notes  Animal-Assisted Activity/Therapy (AAA/T) Program Checklist/Progress Notes Patient Eligibility Criteria Checklist & Daily Group note for Rec Tx Intervention  Date: 06.23.2015 Time: 2:45 [facility] [address]    AAA/T Program Assumption of Risk Form signed by Patient/ or Parent Legal Guardian yes  Patient is free of allergies or sever asthma yes  Patient reports no fear of animals yes  Patient reports no history of cruelty to animals yes   Patient understands his/her participation is voluntary yes  Patient washes hands before animal contact yes  Patient washes hands after animal contact yes  Behavioral Response: Appropriate   Education: Hand Washing, Appropriate Animal Interaction   Education Outcome: Acknowledges understanding  Clinical Observations/Feedback: Patient interacted appropriately with therapy dog, handler and MHT.   Marykay Lexenise L Blanchfield, LRT/CTRS  Blanchfield, Denise L 01/21/2014 4:16 PM

## 2014-01-21 NOTE — Progress Notes (Signed)
Patient ID: [name], female   DOB: 10-29-1990, 23 y.o.   MRN: [id number]  Pt resting in bed with eyes closed. No distress noted.

## 2014-01-21 NOTE — [facility] Group Notes (Signed)
[facility] LCSW Group Therapy  01/21/2014 1:44 PM  Type of Therapy:  Group Therapy  Participation Level:  Minimal  Participation Quality:  Drowsy  Affect:  Depressed and Flat  Cognitive:  Oriented  Insight:  Improving  Engagement in Therapy:  Improving  Modes of Intervention:  Confrontation, Discussion, Education, Exploration, Problem-solving, Rapport Building, Socialization and Support  Summary of Progress/Problems: MHA Speaker came to talk about his personal journey with substance abuse and addiction. The pt processed ways by which to relate to the speaker. MHA speaker provided handouts and educational information pertaining to groups and services offered by the [facility].    Smart, Heather LCSWA 01/21/2014, 1:44 PM

## 2014-01-21 NOTE — Progress Notes (Signed)
The focus of this group is to educate the patient on the purpose and policies of crisis stabilization and provide a format to answer questions about their admission.  The group details unit policies and expectations of patients while admitted. Patient attended group and was an active participant in exercises.

## 2014-01-21 NOTE — Clinical Social Work Note (Signed)
CSW attempted to meet with pt to discuss aftercare plan. She was resting in room and asked to be seen in the AM. Pt pleasant but lethargic.   The Sherwin-WilliamsHeather Smart, LCSWA 01/21/2014 3:16 PM

## 2014-01-21 NOTE — Tx Team (Signed)
Interdisciplinary Treatment Plan Update (Adult)  Date: 01/21/2014   Time Reviewed:  10:55 AM   Progress in Treatment: Attending groups: Yes Participating in groups:  Yes Taking medication as prescribed:  Yes Tolerating medication:  Yes Family/Significant othe contact made: Completed with Ashley Jacobshris W. (pt's friend) Patient understands diagnosis:  Yes Discussing patient identified problems/goals with staff:  Yes Medical problems stabilized or resolved:  Yes Denies suicidal/homicidal ideation: Yes Issues/concerns per patient self-inventory:  Yes Other:  New problem(s) identified: N/A  Discharge Plan or Barriers: CSW assessing for appropriate referrals.  [facility]   Reason for Continuation of Hospitalization: Anxiety Depression Medication Stabilization  Comments: N/A  Estimated length of stay: 2-3 days  For review of initial/current patient goals, please see plan of care.  Attendees: Patient:     Family:     Physician:  Dr. Dub MikesLugo MD  01/21/2014 10:55 AM     Clinical Social Worker:  Trula SladeHeather Smart, LCSWA   01/21/2014 10:55 AM           Other:     Other:    Other:    Other:    Other:    Other:    Other:     Scribe for Treatment Team:

## 2014-01-21 NOTE — Progress Notes (Signed)
[facility]  01/21/2014 3:15 PM Morgan ProfferJenna A [name]  MRN:  [id number] Subjective:  Morgan StanfordJenna states that she has been trying to get her life together. She is working on trying to feel comfortable around people. She sometimes gets overwhelmed with her thoughts. She admits that she drinks on weekends when she feels overwhelmed in order to self medicate. States that she can see how this can get to  be a problem. Does not want to keep doing it. She hopes that the medication changes will be helpful Diagnosis:   DSM5: Schizophrenia Disorders:  none Obsessive-Compulsive Disorders:  none Trauma-Stressor Disorders:  Posttraumatic Stress Disorder (309.81) Substance/Addictive Disorders:  Alcohol Intoxication with Use Disorder - Mild ((F10.129) Depressive Disorders:  Major Depressive Disorder - Moderate (296.22) Total Time spent with patient: 30 minutes  Axis I: ADHD, combined type  ADL's:  Intact  Sleep: Fair  Appetite:  Fair  Suicidal Ideation:  Plan:  denies Intent:  denies Means:  denies Homicidal Ideation:  Plan:  denies Intent:  denies Means:  denies AEB (as evidenced by):  Psychiatric Specialty Exam: Physical Exam  Review of Systems  Constitutional: Negative.   HENT: Negative.   Eyes: Negative.   Respiratory: Negative.   Cardiovascular: Negative.   Gastrointestinal: Negative.   Genitourinary: Negative.   Musculoskeletal: Negative.   Skin: Negative.   Neurological: Negative.   Endo/Heme/Allergies: Negative.   Psychiatric/Behavioral: Positive for depression. The patient is nervous/anxious.     Blood pressure 123/81, pulse 97, temperature 97.4 F (36.3 C), temperature source Oral, resp. rate 18, height 5' 6.25" (1.683 m), weight 99.338 kg (219 lb), last menstrual period 12/24/2013.Body mass index is 35.07 kg/(m^2).  General Appearance: Fairly Groomed  Patent attorneyye Contact::  Fair  Speech:  Clear and Coherent  Volume:  Decreased  Mood:  Anxious and sad, worried  Affect:  anxious,  worried  Thought Process:  Coherent and Goal Directed  Orientation:  Full (Time, Place, and Person)  Thought Content:  symptoms, worries, concerns  Suicidal Thoughts:  No  Homicidal Thoughts:  No  Memory:  Immediate;   Fair Recent;   Fair Remote;   Fair  Judgement:  Fair  Insight:  Present  Psychomotor Activity:  Normal  Concentration:  Fair  Recall:  FiservFair  Fund of Knowledge:NA  Language: Fair  Akathisia:  No  Handed:    AIMS (if indicated):     Assets:  Desire for Improvement  Sleep:  Number of Hours: 6   Musculoskeletal: Strength & Muscle Tone: within normal limits Gait & Station: unsteady Patient leans: N/A  Current Medications: Current Facility-Administered Medications  Medication Dose Route Frequency Provider Last Rate Last Dose  . acetaminophen (TYLENOL) tablet 650 mg  650 mg Oral Q6H PRN Benjaman PottGerald D Taylor, MD   650 mg at 01/21/14 0925  . alum & mag hydroxide-simeth (MAALOX/MYLANTA) 200-200-20 MG/5ML suspension 30 mL  30 mL Oral Q4H PRN Benjaman PottGerald D Taylor, MD      . ARIPiprazole (ABILIFY) tablet 2 mg  2 mg Oral Daily Rachael FeeIrving A Lugo, MD   2 mg at 01/21/14 0803  . clonazePAM (KLONOPIN) tablet 0.5 mg  0.5 mg Oral QHS PRN Benjaman PottGerald D Taylor, MD   0.5 mg at 01/20/14 2102  . escitalopram (LEXAPRO) tablet 10 mg  10 mg Oral Daily Rachael FeeIrving A Lugo, MD   10 mg at 01/21/14 0803  . feeding supplement (GLUCERNA SHAKE) (GLUCERNA SHAKE) liquid 237 mL  237 mL Oral TID BM Earna CoderHaley A Hawkins, RD   237  mL at 01/20/14 1324  . loratadine (CLARITIN) tablet 10 mg  10 mg Oral Daily Benjaman PottGerald D Taylor, MD   10 mg at 01/21/14 0803  . magnesium hydroxide (MILK OF MAGNESIA) suspension 30 mL  30 mL Oral Daily PRN Benjaman PottGerald D Taylor, MD      . montelukast (SINGULAIR) tablet 10 mg  10 mg Oral QHS Benjaman PottGerald D Taylor, MD   10 mg at 01/20/14 2102  . nicotine (NICODERM CQ - dosed in mg/24 hours) patch 14 mg  14 mg Transdermal Q0600 Sanjuana KavaAgnes I Nwoko, NP   14 mg at 01/21/14 0548  . ondansetron (ZOFRAN-ODT) disintegrating tablet 4 mg   4 mg Oral Q8H PRN Benjaman PottGerald D Taylor, MD      . Oxcarbazepine (TRILEPTAL) tablet 300 mg  300 mg Oral BID Cleotis NipperSyed T Arfeen, MD   300 mg at 01/21/14 0803  . pramipexole (MIRAPEX) tablet 0.25 mg  0.25 mg Oral QHS Rachael FeeIrving A Lugo, MD      . prazosin (MINIPRESS) capsule 1 mg  1 mg Oral BID Benjaman PottGerald D Taylor, MD   1 mg at 01/21/14 [id number]  . zolpidem (AMBIEN) tablet 10 mg  10 mg Oral QHS PRN Rachael FeeIrving A Lugo, MD        Lab Results: No results found for this or any previous visit (from the past 48 hour(s)).  Physical Findings: AIMS: Facial and Oral Movements Muscles of Facial Expression: None, normal Lips and Perioral Area: None, normal Jaw: None, normal Tongue: None, normal,Extremity Movements Upper (arms, wrists, hands, fingers): None, normal Lower (legs, knees, ankles, toes): None, normal, Trunk Movements Neck, shoulders, hips: None, normal, Overall Severity Severity of abnormal movements (highest score from questions above): None, normal Incapacitation due to abnormal movements: None, normal Patient's awareness of abnormal movements (rate only patient's report): No Awareness, Dental Status Current problems with teeth and/or dentures?: No Does patient usually wear dentures?: No  CIWA:  CIWA-Ar Total: 0 COWS:     Treatment Plan Summary: Daily contact with patient to assess and evaluate symptoms and progress in treatment Medication management  Plan: Supportive approach/coping skills/relapse prevention           Optimize response to psychotropic agents  Medical Decision Making Problem Points:  Review of psycho-social stressors (1) Data Points:  Review of medication regiment & side effects (2)  I certify that inpatient services furnished can reasonably be expected to improve the patient's condition.   LUGO,IRVING A 01/21/2014, 3:15 PM

## 2014-01-21 NOTE — Progress Notes (Signed)
Pt attended spiritual care group on grief and loss facilitated by chaplain Burnis KingfisherMatthew Stalnaker. Group opened with brief discussion and psycho-social ed around grief and loss in relationships and in relation to self - identifying life patterns, circumstances, changes that cause losses. Established group norm of speaking from own life experience. Group goal of establishing open and affirming space for members to share loss and experience with grief, normalize grief experience and provide psycho social education and grief support.   Morgan StanfordJenna was present at beginning of group.  She fell asleep and was awoken a few minutes later.  Stated that she was drowsy and worried that her snoring would be disruptive to the group.  She excused herself and did not return to group.

## 2014-01-22 MED ORDER — ZOLPIDEM TARTRATE 10 MG PO TABS: 10.0000 mg | ORAL_TABLET | Freq: Every evening | ORAL | Status: AC | PRN

## 2014-01-22 MED ORDER — PRAMIPEXOLE DIHYDROCHLORIDE 0.25 MG PO TABS: 0.2500 mg | ORAL_TABLET | Freq: Every day | ORAL | Status: DC

## 2014-01-22 MED ORDER — ALBUTEROL SULFATE HFA 108 (90 BASE) MCG/ACT IN AERS: 2.0000 | INHALATION_SPRAY | RESPIRATORY_TRACT | Status: DC | PRN

## 2014-01-22 MED ORDER — OXCARBAZEPINE 300 MG PO TABS: 300.0000 mg | ORAL_TABLET | Freq: Two times a day (BID) | ORAL | Status: DC

## 2014-01-22 MED ORDER — CETIRIZINE HCL 10 MG PO TABS: 10.0000 mg | ORAL_TABLET | Freq: Two times a day (BID) | ORAL | Status: DC

## 2014-01-22 MED ORDER — ESCITALOPRAM OXALATE 10 MG PO TABS: 10.0000 mg | ORAL_TABLET | Freq: Every day | ORAL | Status: DC

## 2014-01-22 MED ORDER — PRAZOSIN HCL 1 MG PO CAPS: 1.0000 mg | ORAL_CAPSULE | Freq: Two times a day (BID) | ORAL | Status: DC

## 2014-01-22 MED ORDER — CLONAZEPAM 0.5 MG PO TABS: 0.5000 mg | ORAL_TABLET | Freq: Every evening | ORAL | Status: DC | PRN

## 2014-01-22 MED ORDER — MONTELUKAST SODIUM 10 MG PO TABS: 10.0000 mg | ORAL_TABLET | Freq: Every day | ORAL | Status: DC

## 2014-01-22 MED ORDER — ARIPIPRAZOLE 2 MG PO TABS: 2.0000 mg | ORAL_TABLET | Freq: Every day | ORAL | Status: DC

## 2014-01-22 NOTE — Progress Notes (Signed)
Patient ID: Galvin ProfferJenna A Franckowiak, female   DOB: Dec 02, 1990, 23 y.o.   MRN: [id number]  D: Pt stated she plans to do o/p therapy in effort to keep working. Stated, "I feel better when I work". Pt stated she plans to go home to her boyfriend, "he's a good guy and maybe if I stay on my meds I'll remember that." Pt wanted to try nicotine gum rather than the patch this morning.   A: Gave pt a piece of gum and informed to tell staff this morning if she wanted an order. Support and encouragement was offered. 15 min checks continued for safety.  R: Pt remains safe.

## 2014-01-22 NOTE — [facility] Group Notes (Signed)
[facility]   01/22/2014 9:51 AM  Participation Quality: Appropriate   Mood/Affect:  Appropriate  Depression Rating:  3  Anxiety Rating:  7  Thoughts of Suicide:  No Will you contract for safety?   NA  Current AVH:  No.   Plan for Discharge/Comments:  Pt wanting to d/c today. [facility]/therapy/assessment for group services. She will return home to live with her boyfriend at d/c.   Transportation Means: boyfriend   Supports: boyfriend   Counselling psychologistmart, OncologistHeather LCSWA

## 2014-01-22 NOTE — Progress Notes (Signed)
[facility] Discharge [facility] :  Will you be returning to the same living situation after discharge: Yes,  home At discharge, do you have transportation home?:Yes,  boyfriend Do you have the ability to pay for your medications:Yes,  mental health. pt reports that she does not have private insurance as indicated   Release of information consent forms completed and submitted to Medical Records by CSW. Patient to Follow up at: Follow-up Information   [facility] (Walk in between 8am-12pm Monday through Friday for hospital followup and assessment for therapy/group services. )    Contact information:   [address] [place], [place] [address] Phone: [phone] Fax: [phone]      Patient denies SI/HI:   Yes,  during group/self report    Safety Planning and Suicide Prevention discussed:  Yes,  SPE completed with pt's boyfriend. Pt provided with SPI pamphlet to share with her support network.   Smart, Heather LCSWA  01/22/2014, 11:27 AM
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
## 2014-01-22 NOTE — [facility] Suicide Risk Assessment (Signed)
Suicide Risk Assessment  Discharge Assessment     Demographic Factors:  NA  Total Time spent with patient: 45 minutes  Psychiatric Specialty Exam:     Blood pressure 129/96, pulse 91, temperature 97.2 F (36.2 C), temperature source Oral, resp. rate 18, height 5' 6.25" (1.683 m), weight 99.338 kg (219 lb), last menstrual period 12/24/2013.Body mass index is 35.07 kg/(m^2).  General Appearance: Fairly Groomed  Patent attorneyye Contact::  Fair  Speech:  Clear and Coherent  Volume:  Normal  Mood:  Anxious  Affect:  Appropriate  Thought Process:  Coherent and Goal Directed  Orientation:  Full (Time, Place, and Person)  Thought Content:  plans as she moves on  Suicidal Thoughts:  No  Homicidal Thoughts:  No  Memory:  Immediate;   Fair Recent;   Fair Remote;   Fair  Judgement:  Fair  Insight:  Present  Psychomotor Activity:  Normal  Concentration:  Fair  Recall:  FiservFair  Fund of Knowledge:NA  Language: Fair  Akathisia:  No  Handed:    AIMS (if indicated):     Assets:  Desire for Improvement  Sleep:  Number of Hours: 6    Musculoskeletal: Strength & Muscle Tone: within normal limits Gait & Station: normal Patient leans: N/A   Mental Status Per Nursing Assessment::   On Admission:  NA  Current Mental Status by Physician: In full contact with reality. Mood is "better" affect is appropriate. She admits to having better coping strategies. Admits she is being mindful of her alcohol use and is not going to let it continue. She is willing to continue to work on an outpatient basis.   Loss Factors: NA  Historical Factors: NA  Risk Reduction Factors:   Living with another person, especially a relative and Positive social support  Continued Clinical Symptoms:  Bipolar Disorder:   Depressive phase  Cognitive Features That Contribute To Risk:  Closed-mindedness Polarized thinking Thought constriction (tunnel vision)    Suicide Risk:  Minimal: No identifiable suicidal ideation.   Patients presenting with no risk factors but with morbid ruminations; may be classified as minimal risk based on the severity of the depressive symptoms  Discharge Diagnoses:   AXIS I:  ADHD, combined type, Bipolar, Depressed and Post Traumatic Stress Disorder AXIS II:  No diagnosis AXIS III:   Past Medical History  Diagnosis Date  . Eczema   . TMJ (dislocation of temporomandibular joint)   . Depression   . ADHD (attention deficit hyperactivity disorder)   . PTSD (post-traumatic stress disorder)   . Anxiety   . Asthma    AXIS IV:  other psychosocial or environmental problems AXIS V:  61-70 mild symptoms  Plan Of Care/Follow-up recommendations:  Activity:  as tolerated Diet:  regular Follow up outpatient basis Is patient on multiple antipsychotic therapies at discharge:  No   Has Patient had three or more failed trials of antipsychotic monotherapy by history:  No  Recommended Plan for Multiple Antipsychotic Therapies: NA    LUGO,IRVING A 01/22/2014, 1:28 PM
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
## 2014-01-24 NOTE — Progress Notes (Signed)
Patient Discharge Instructions:  After Visit Summary (AVS):   Faxed to:  01/24/14 Discharge Summary Note:   Faxed to:  01/24/14 Psychiatric Admission Assessment Note:   Faxed to:  01/24/14 Suicide Risk Assessment - Discharge Assessment:   Faxed to:  01/24/14 Faxed/Sent to the Next Level Care provider:  01/24/14 [facility] @ [phone]  Jerelene ReddenSheena E [place], 01/24/2014, 3:14 PM

## 2014-04-04 ENCOUNTER — Ambulatory Visit (HOSPITAL_COMMUNITY)
Admission: AD | Admit: 2014-04-04 | Discharge: 2014-04-04 | Disposition: A | Discharge status: Home / Self Care | Payer: Federal, State, Local not specified - Other | Attending: Psychiatry | Admitting: Psychiatry

## 2014-04-04 ENCOUNTER — Emergency Department (HOSPITAL_COMMUNITY)
Admission: EM | Admit: 2014-04-04 | Discharge: 2014-04-07 | Disposition: A | Discharge status: Other Acute Inpatient Hospital | Payer: Self-pay | Attending: Emergency Medicine | Admitting: Emergency Medicine

## 2014-04-04 ENCOUNTER — Encounter (HOSPITAL_COMMUNITY): Payer: Self-pay | Admitting: Emergency Medicine

## 2014-04-04 DIAGNOSIS — J45909 Unspecified asthma, uncomplicated: Secondary | ICD-10-CM | POA: Insufficient documentation

## 2014-04-04 DIAGNOSIS — R45851 Suicidal ideations: Secondary | ICD-10-CM | POA: Insufficient documentation

## 2014-04-04 DIAGNOSIS — Z8739 Personal history of other diseases of the musculoskeletal system and connective tissue: Secondary | ICD-10-CM | POA: Insufficient documentation

## 2014-04-04 DIAGNOSIS — Z87891 Personal history of nicotine dependence: Secondary | ICD-10-CM | POA: Insufficient documentation

## 2014-04-04 DIAGNOSIS — F3289 Other specified depressive episodes: Secondary | ICD-10-CM | POA: Insufficient documentation

## 2014-04-04 DIAGNOSIS — Z79899 Other long term (current) drug therapy: Secondary | ICD-10-CM | POA: Insufficient documentation

## 2014-04-04 DIAGNOSIS — F329 Major depressive disorder, single episode, unspecified: Secondary | ICD-10-CM | POA: Insufficient documentation

## 2014-04-04 DIAGNOSIS — F319 Bipolar disorder, unspecified: Secondary | ICD-10-CM

## 2014-04-04 DIAGNOSIS — Z872 Personal history of diseases of the skin and subcutaneous tissue: Secondary | ICD-10-CM | POA: Insufficient documentation

## 2014-04-04 DIAGNOSIS — F4310 Post-traumatic stress disorder, unspecified: Secondary | ICD-10-CM | POA: Insufficient documentation

## 2014-04-04 DIAGNOSIS — F909 Attention-deficit hyperactivity disorder, unspecified type: Secondary | ICD-10-CM | POA: Insufficient documentation

## 2014-04-04 DIAGNOSIS — F411 Generalized anxiety disorder: Secondary | ICD-10-CM | POA: Insufficient documentation
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
## 2014-04-04 NOTE — ED Notes (Signed)
Pt brought to ED by [facility] from [facility]. Pt endorses SI with plan of hanging herself or taking pills because she does not have access to weapon. Pt states she attempted to OD in January

## 2014-04-05 LAB — RAPID URINE DRUG SCREEN, HOSP PERFORMED
Amphetamines: NOT DETECTED
BARBITURATES: NOT DETECTED
Benzodiazepines: NOT DETECTED
COCAINE: NOT DETECTED
Opiates: NOT DETECTED
TETRAHYDROCANNABINOL: POSITIVE — AB

## 2014-04-05 LAB — CBC
HEMATOCRIT: 39.6 % (ref 36.0–46.0)
HEMOGLOBIN: 13.4 g/dL (ref 12.0–15.0)
MCH: 27.5 pg (ref 26.0–34.0)
MCHC: 33.8 g/dL (ref 30.0–36.0)
MCV: 81.3 fL (ref 78.0–100.0)
Platelets: 238 10*3/uL (ref 150–400)
RBC: 4.87 MIL/uL (ref 3.87–5.11)
RDW: 15 % (ref 11.5–15.5)
WBC: 8.1 10*3/uL (ref 4.0–10.5)

## 2014-04-05 LAB — COMPREHENSIVE METABOLIC PANEL
ALT: 13 U/L (ref 0–35)
ANION GAP: 11 (ref 5–15)
AST: 14 U/L (ref 0–37)
Albumin: 3.8 g/dL (ref 3.5–5.2)
Alkaline Phosphatase: 71 U/L (ref 39–117)
BUN: 11 mg/dL (ref 6–23)
CHLORIDE: 103 meq/L (ref 96–112)
CO2: 25 mEq/L (ref 19–32)
CREATININE: 0.77 mg/dL (ref 0.50–1.10)
Calcium: 9.8 mg/dL (ref 8.4–10.5)
GFR calc Af Amer: >90 mL/min (ref >90)
GFR calc non Af Amer: >90 mL/min (ref >90)
GLUCOSE: 80 mg/dL (ref 70–99)
Potassium: 4.3 mEq/L (ref 3.7–5.3)
Sodium: 139 mEq/L (ref 137–147)
Total Protein: 7.8 g/dL (ref 6.0–8.3)

## 2014-04-05 LAB — ETHANOL: Alcohol, Ethyl (B): <11 mg/dL (ref 0–11)

## 2014-04-05 LAB — SALICYLATE LEVEL: Salicylate Lvl: <2 mg/dL — ABNORMAL LOW (ref 2.8–20.0)

## 2014-04-05 LAB — ACETAMINOPHEN LEVEL: Acetaminophen (Tylenol), Serum: <15 ug/mL (ref 10–30)

## 2014-04-05 MED ORDER — HYDROXYZINE HCL 25 MG PO TABS
100.0000 mg | ORAL_TABLET | Freq: Once | ORAL | Status: AC
  Administered 2014-04-05: 100 mg via ORAL
  Filled 2014-04-05: qty 4

## 2014-04-05 MED ORDER — LORAZEPAM 1 MG PO TABS
2.0000 mg | ORAL_TABLET | Freq: Once | ORAL | Status: AC
  Administered 2014-04-05: 2 mg via ORAL
  Filled 2014-04-05: qty 2

## 2014-04-05 MED ORDER — ARIPIPRAZOLE 2 MG PO TABS
2.0000 mg | ORAL_TABLET | Freq: Every day | ORAL | Status: DC
  Administered 2014-04-05 – 2014-04-07 (×3): 2 mg via ORAL
  Filled 2014-04-05 (×3): qty 1

## 2014-04-05 MED ORDER — PRAZOSIN HCL 1 MG PO CAPS
1.0000 mg | ORAL_CAPSULE | Freq: Two times a day (BID) | ORAL | Status: DC
  Administered 2014-04-05 – 2014-04-07 (×6): 1 mg via ORAL
  Filled 2014-04-05 (×7): qty 1

## 2014-04-05 MED ORDER — ALBUTEROL SULFATE HFA 108 (90 BASE) MCG/ACT IN AERS: 2.0000 | INHALATION_SPRAY | RESPIRATORY_TRACT | Status: DC | PRN

## 2014-04-05 MED ORDER — CITALOPRAM HYDROBROMIDE 20 MG PO TABS
20.0000 mg | ORAL_TABLET | Freq: Every day | ORAL | Status: DC
  Administered 2014-04-05 – 2014-04-07 (×3): 20 mg via ORAL
  Filled 2014-04-05 (×3): qty 1

## 2014-04-05 MED ORDER — LORATADINE 10 MG PO TABS
10.0000 mg | ORAL_TABLET | Freq: Every day | ORAL | Status: DC
  Administered 2014-04-05 – 2014-04-07 (×3): 10 mg via ORAL
  Filled 2014-04-05 (×3): qty 1

## 2014-04-05 MED ORDER — OXCARBAZEPINE 300 MG PO TABS
300.0000 mg | ORAL_TABLET | Freq: Two times a day (BID) | ORAL | Status: DC
  Administered 2014-04-05 – 2014-04-07 (×6): 300 mg via ORAL
  Filled 2014-04-05 (×7): qty 1

## 2014-04-05 MED ORDER — PRAMIPEXOLE DIHYDROCHLORIDE 0.25 MG PO TABS
0.2500 mg | ORAL_TABLET | Freq: Every day | ORAL | Status: DC
  Administered 2014-04-05 – 2014-04-06 (×3): 0.25 mg via ORAL
  Filled 2014-04-05 (×4): qty 1

## 2014-04-05 MED ORDER — MONTELUKAST SODIUM 10 MG PO TABS
10.0000 mg | ORAL_TABLET | Freq: Every day | ORAL | Status: DC
  Administered 2014-04-05 – 2014-04-07 (×3): 10 mg via ORAL
  Filled 2014-04-05 (×3): qty 1

## 2014-04-05 MED ORDER — TRAZODONE HCL 100 MG PO TABS
100.0000 mg | ORAL_TABLET | Freq: Every day | ORAL | Status: AC
  Administered 2014-04-05: 100 mg via ORAL
  Filled 2014-04-05: qty 1

## 2014-04-05 MED ORDER — DIPHENHYDRAMINE HCL 25 MG PO CAPS
25.0000 mg | ORAL_CAPSULE | Freq: Four times a day (QID) | ORAL | Status: DC | PRN
  Administered 2014-04-06: 25 mg via ORAL
  Filled 2014-04-05: qty 1

## 2014-04-05 NOTE — ED Provider Notes (Signed)
Medical screening examination/treatment/procedure(s) were performed by non-physician practitioner and as supervising physician I was immediately available for consultation/collaboration.     Geoffery Lyons, MD 04/05/14 1351

## 2014-04-05 NOTE — ED Provider Notes (Signed)
CSN: [id number]     Arrival date & time 04/04/14  2247 History   First MD Initiated Contact with Patient 04/04/14 2351     Chief Complaint  Patient presents with  . Suicidal   HPI  History is history provided by the patient. The patient is a 23 year old female with history of PTSD, anxiety and depression presenting with worsening depression and suicidal ideation. Patient states she has had worsening depression for the last week. Today she had a very strong thoughts of killing herself. She states that she was thinking of possibly hitting herself or overdosing. Does have previous history of overdose in January. Patient is treated for her depression and has been taking her medicine but states this week it has not been changed. She did call the crisis line one week ago as well which gave brief temporary improvement. This evening she went to behavioral health who referred her here for medical clearance. She denies any other complaints. Denies any drug or alcohol use.    Past Medical History  Diagnosis Date  . Eczema   . TMJ (dislocation of temporomandibular joint)   . Depression   . ADHD (attention deficit hyperactivity disorder)   . PTSD (post-traumatic stress disorder)   . Anxiety   . Asthma    Past Surgical History  Procedure Laterality Date  . Addenoid    . Foot surgery     Family History  Problem Relation Age of Onset  . Depression Mother   . Hypertension Mother   . Hypertension Maternal Grandmother   . Hypertension Maternal Grandfather   . Diabetes Maternal Grandfather   . Hypertension Paternal Grandmother   . Diabetes Paternal Grandmother   . Heart murmur Mother    History  Substance Use Topics  . Smoking status: Former Smoker -- 0.50 packs/day for 2 years    Types: Cigarettes  . Smokeless tobacco: Current User  . Alcohol Use: 1.2 oz/week    2 Shots of liquor per week     Comment: social / twice a month   OB History   Grav Para Term Preterm Abortions TAB SAB Ect Mult  Living                 Review of Systems  All other systems reviewed and are negative.     Allergies  Food; Peanut-containing drug products; Soy allergy; Geodon; Lactose intolerance (gi); Vitamin c; and Sulfa antibiotics  Home Medications   Prior to Admission medications   Medication Sig Start Date End Date Taking? Authorizing Provider  albuterol (VENTOLIN HFA) 108 (90 BASE) MCG/ACT inhaler Inhale 2 puffs into the lungs every 4 (four) hours as needed for wheezing or shortness of breath. 01/22/14  Yes Sanjuana Kava, NP  ARIPiprazole (ABILIFY) 2 MG tablet Take 1 tablet (2 mg total) by mouth daily. For mood control 01/22/14  Yes Sanjuana Kava, NP  cetirizine (ZYRTEC) 10 MG tablet Take 1 tablet (10 mg total) by mouth 2 (two) times daily. For allergies 01/22/14  Yes Sanjuana Kava, NP  citalopram (CELEXA) 20 MG tablet Take 20 mg by mouth daily.   Yes Historical Provider, MD  diphenhydrAMINE (BENADRYL) 25 mg capsule Take 25 mg by mouth every 6 (six) hours as needed for allergies.   Yes Historical Provider, MD  montelukast (SINGULAIR) 10 MG tablet Take 1 tablet (10 mg total) by mouth daily. For asthma 01/22/14  Yes Sanjuana Kava, NP  Oxcarbazepine (TRILEPTAL) 300 MG tablet Take 1 tablet (300 mg total)  by mouth 2 (two) times daily. For mood stabilization 01/22/14  Yes Sanjuana Kava, NP  pramipexole (MIRAPEX) 0.25 MG tablet Take 1 tablet (0.25 mg total) by mouth at bedtime. For restless legs 01/22/14  Yes Sanjuana Kava, NP  prazosin (MINIPRESS) 1 MG capsule Take 1 capsule (1 mg total) by mouth 2 (two) times daily. For nightmares 01/22/14  Yes Sanjuana Kava, NP   BP 122/69  Pulse 66  Temp(Src) 98.5 F (36.9 C) (Oral)  Resp 16  Ht  (1.651 m)  Wt 210 lb (95.255 kg)  BMI 34.95 kg/m2  SpO2 99%  LMP 03/14/2014 Physical Exam  Nursing note and vitals reviewed. Constitutional: She is oriented to person, place, and time. She appears well-developed and well-nourished. No distress.  HENT:  Head:  Normocephalic.  Eyes: Conjunctivae are normal.  Cardiovascular: Normal rate and regular rhythm.   Pulmonary/Chest: Effort normal and breath sounds normal. No respiratory distress. She has no wheezes.  Musculoskeletal: Normal range of motion.  Neurological: She is alert and oriented to person, place, and time.  Skin: Skin is warm and dry. No rash noted.  Psychiatric: Her behavior is normal. She exhibits a depressed mood. She expresses suicidal ideation.    ED Course  Procedures   COORDINATION OF CARE:  Nursing notes reviewed. Vital signs reviewed. Initial pt interview and examination performed.   Filed Vitals:   04/04/14 2340 04/04/14 2344  BP: 122/69   Pulse: 66   Temp: 98.5 F (36.9 C)   TempSrc: Oral   Resp: 16   Height:   (1.651 m)  Weight:  210 lb (95.255 kg)  SpO2: 99%     12:23 AM-patient seen and evaluated. She appears well no acute distress. Clinically he does not appear to have any concerning medical emergency. Medical screening labs ordered. Psychiatric holding orders placed. TTS consult placed.  Pt medically cleared and stable for further Psych eval.   Results for orders placed during the hospital encounter of 04/04/14  ACETAMINOPHEN LEVEL      Result Value Ref Range   Acetaminophen (Tylenol), Serum <15.0  10 - 30 ug/mL  CBC      Result Value Ref Range   WBC 8.1  4.0 - 10.5 K/uL   RBC 4.87  3.87 - 5.11 MIL/uL   Hemoglobin 13.4  12.0 - 15.0 g/dL   HCT 16.1  09.6 - 04.5 %   MCV 81.3  78.0 - 100.0 fL   MCH 27.5  26.0 - 34.0 pg   MCHC 33.8  30.0 - 36.0 g/dL   RDW 40.9  81.1 - 91.4 %   Platelets 238  150 - 400 K/uL  COMPREHENSIVE METABOLIC PANEL      Result Value Ref Range   Sodium 139  137 - 147 mEq/L   Potassium 4.3  3.7 - 5.3 mEq/L   Chloride 103  96 - 112 mEq/L   CO2 25  19 - 32 mEq/L   Glucose, Bld 80  70 - 99 mg/dL   BUN 11  6 - 23 mg/dL   Creatinine, Ser 7.82  0.50 - 1.10 mg/dL   Calcium 9.8  8.4 - 95.6 mg/dL   Total Protein 7.8  6.0 - 8.3  g/dL   Albumin 3.8  3.5 - 5.2 g/dL   AST 14  0 - 37 U/L   ALT 13  0 - 35 U/L   Alkaline Phosphatase 71  39 - 117 U/L   Total Bilirubin <0.2 (*) 0.3 -  1.2 mg/dL   GFR calc non Af Amer >90  >90 mL/min   GFR calc Af Amer >90  >90 mL/min   Anion gap 11  5 - 15  ETHANOL      Result Value Ref Range   Alcohol, Ethyl (B) <11  0 - 11 mg/dL  SALICYLATE LEVEL      Result Value Ref Range   Salicylate Lvl <2.0 (*) 2.8 - 20.0 mg/dL  URINE RAPID DRUG SCREEN (HOSP PERFORMED)      Result Value Ref Range   Opiates NONE DETECTED  NONE DETECTED   Cocaine NONE DETECTED  NONE DETECTED   Benzodiazepines NONE DETECTED  NONE DETECTED   Amphetamines NONE DETECTED  NONE DETECTED   Tetrahydrocannabinol POSITIVE (*) NONE DETECTED   Barbiturates NONE DETECTED  NONE DETECTED    MDM   Final diagnoses:  Suicidal ideation      Angus Seller, PA-C 04/05/14 [phone]

## 2014-04-05 NOTE — BH Assessment (Signed)
Per Binnie Rail, [facility], adult unit is currently at capacity. Contacted the following facilities for placement:  BED AVAILABLE, FAXED CLINICAL INFORMATION: [facility], per [facility], per [facility], per [facility], per Shaune Pollack, per Annice Pih  AT CAPACITY: [facility], per [facility], per [facility], per [facility], per [facility], per [facility], per [facility], per [facility], per [facility], per [facility]ouchard, per [facility], per Shona Simpson RESPONSE: [facility]  PT DECLINED: [facility] [address] [place], [place], [facility] Triage Specialist [phone]

## 2014-04-05 NOTE — ED Notes (Signed)
Pt crying, tearful, requesting meds for her nerves.  PA Maryjean Morn notified.  Telephone order given for Vistaril s oral.

## 2014-04-05 NOTE — ED Notes (Signed)
Pt. In scrubs. Pt. And belongings searched and wanded by security. Pt. Has 2 belongings bag. Pt. Has bra, croc flip flops, pink cell phone, beige purse, diamond earrings(black),black cami, gray sweater,blue narcotic book, pink notebook, and 1 bag clothing. Pt. Belongings locked up at the nurse station in triage.

## 2014-04-05 NOTE — ED Notes (Signed)
Patient sent to [facility] from [facility].  Patient was a walk in last night.  She states she voluntarily came in to seek help for her SI and depression.  Patient is tearful with sad, depressed mood.  She states she has severe depression and rates it a 10/10.  She reports continuous suicidal thoughts.  Patient has prior admissions at [facility] on [address].

## 2014-04-05 NOTE — Progress Notes (Signed)
Follow-up calls have been placed to the following facilities regarding pt's referral: [facility]- per Crystal currently at capacity [facility]- per [facility] referral had not been reviewed but would call TTS back with update [facility]- per Velna Hatchet bed status a little "iffy" at the moment but would call TTS back [facility]- per Misty Stanley MD had not reviewed referral yet  In addition calls have been placed to the following facilities regarding bed availability for inptx:  REFERRAL FAXED: [facility]- per Darl Pikes adult beds available [facility]- per Darreld Mclean can fax information Abran Cantor- per [facility] accepting referrals [facility]- per Alinda Money expecting d/c's and can fax  Turner Daniels- per [facility] bed available [place]- per Amy low acuity adult beds available [facility]- per Lonell Grandchild can fax info [facility]- per Archie Patten can fax  AT CAPACITY: Earl[facility] Plater- per Vania Rea- per Bevelyn Ngo- per Lurena Joiner [facility]- per Dannielle Huh currently at capacity but would contact TTS if anything changed [facility]- per Brayton Caves [facility]- per [facility]- per Morrie Sheldon  **pt has already been declined at [facility] and [facility] Disposition [facility]

## 2014-04-05 NOTE — Consult Note (Signed)
[facility] Face-to-Face Psychiatry Consult   Reason for Consult:  depression Referring Physician:  EDP Morgan [name] is an 23 y.o. female. Total Time spent with patient: 30 minutes  Assessment: AXIS I:  Bipolar, Depressed AXIS II:  Deferred AXIS III:   Past Medical History  Diagnosis Date  . Eczema   . TMJ (dislocation of temporomandibular joint)   . Depression   . ADHD (attention deficit hyperactivity disorder)   . PTSD (post-traumatic stress disorder)   . Anxiety   . Asthma    AXIS IV:  other psychosocial or environmental problems AXIS V:  11-20 some danger of hurting self or others possible OR occasionally fails to maintain minimal personal hygiene OR gross impairment in communication  Plan:  Recommend psychiatric Inpatient admission when medically cleared.  Subjective:   Morgan [name] is a 23 y.o. female patient admitted with depressed mood and suicidal ideation.  HPI:  Patient states she has a history of bipolar disorder but recently has been depressed. She claims she had a manic episode several months ago and spent a lot of money on credit cards. She feels ashamed and guilty about it. She lives with boyfriend and he pays the bills. She feels guilty about this and unworthy. Lately she has been more depressed.reports energy is low, eating and sleeping are variable. Mood is low and she feels suicidal with plan to take an overdose of pills. She attends [facility] and take medications -Abilify, trileptal and Celexa as prescribed. She doesn't think they are working. Denies current drug or alcohol use. No AH/VH  HPI Elements:   Location:  global. Quality:  severe. Severity:  severe. Timing:  months. Duration:  several weeks. Context:  guilt about overspending.  Past Psychiatric History: Past Medical History  Diagnosis Date  . Eczema   . TMJ (dislocation of temporomandibular joint)   . Depression   . ADHD (attention deficit hyperactivity disorder)   . PTSD (post-traumatic stress  disorder)   . Anxiety   . Asthma     reports that she has quit smoking. Her smoking use included Cigarettes. She has a 1 pack-year smoking history. She uses smokeless tobacco. She reports that she drinks about 1.2 ounces of alcohol per week. She reports that she uses illicit drugs (Marijuana). Family History  Problem Relation Age of Onset  . Depression Mother   . Hypertension Mother   . Hypertension Maternal Grandmother   . Hypertension Maternal Grandfather   . Diabetes Maternal Grandfather   . Hypertension Paternal Grandmother   . Diabetes Paternal Grandmother   . Heart murmur Mother            Allergies:   Allergies  Allergen Reactions  . Food Anaphylaxis    Melons: any type in the melon family with cause anaphylaxis   . Peanut-Containing Drug Products Anaphylaxis  . Soy Allergy Anaphylaxis  . Geodon [Ziprasidone Hcl] Other (See Comments)    Hypersomnolence. All atypical antipsychotics.     . Lactose Intolerance (Gi) Other (See Comments)    Reaction: GI upset  . Vitamin C Itching    Most food which have vitamin c causes itching  . Sulfa Antibiotics Rash    Childhood allergy     Objective: Blood pressure 126/61, pulse 71, temperature 98.3 F (36.8 C), temperature source Oral, resp. rate 16, height 5' 5" (1.651 m), weight 210 lb (95.255 kg), last menstrual period 03/14/2014, SpO2 100.00%.Body mass index is 34.95 kg/(m^2). Results for orders placed during the hospital encounter of 04/04/14 (  from the past 72 hour(s))  ACETAMINOPHEN LEVEL     Status: None   Collection Time    04/05/14 12:28 AM      Result Value Ref Range   Acetaminophen (Tylenol), Serum <15.0  10 - 30 ug/mL   Comment:            THERAPEUTIC CONCENTRATIONS VARY     SIGNIFICANTLY. A RANGE OF 10-30     ug/mL MAY BE AN EFFECTIVE     CONCENTRATION FOR MANY PATIENTS.     HOWEVER, SOME ARE BEST TREATED     AT CONCENTRATIONS OUTSIDE THIS     RANGE.     ACETAMINOPHEN CONCENTRATIONS     >150 ug/mL AT 4  HOURS AFTER     INGESTION AND >50 ug/mL AT 12     HOURS AFTER INGESTION ARE     OFTEN ASSOCIATED WITH TOXIC     REACTIONS.  CBC     Status: None   Collection Time    04/05/14 12:28 AM      Result Value Ref Range   WBC 8.1  4.0 - 10.5 K/uL   RBC 4.87  3.87 - 5.11 MIL/uL   Hemoglobin 13.4  12.0 - 15.0 g/dL   HCT 39.6  36.0 - 46.0 %   MCV 81.3  78.0 - 100.0 fL   MCH 27.5  26.0 - 34.0 pg   MCHC 33.8  30.0 - 36.0 g/dL   RDW 15.0  11.5 - 15.5 %   Platelets 238  150 - 400 K/uL  COMPREHENSIVE METABOLIC PANEL     Status: Abnormal   Collection Time    04/05/14 12:28 AM      Result Value Ref Range   Sodium 139  137 - 147 mEq/L   Potassium 4.3  3.7 - 5.3 mEq/L   Chloride 103  96 - 112 mEq/L   CO2 25  19 - 32 mEq/L   Glucose, Bld 80  70 - 99 mg/dL   BUN 11  6 - 23 mg/dL   Creatinine, Ser 0.77  0.50 - 1.10 mg/dL   Calcium 9.8  8.4 - 10.5 mg/dL   Total Protein 7.8  6.0 - 8.3 g/dL   Albumin 3.8  3.5 - 5.2 g/dL   AST 14  0 - 37 U/L   ALT 13  0 - 35 U/L   Alkaline Phosphatase 71  39 - 117 U/L   Total Bilirubin <0.2 (*) 0.3 - 1.2 mg/dL   GFR calc non Af Amer >90  >90 mL/min   GFR calc Af Amer >90  >90 mL/min   Comment: (NOTE)     The eGFR has been calculated using the CKD EPI equation.     This calculation has not been validated in all clinical situations.     eGFR's persistently <90 mL/min signify possible Chronic Kidney     Disease.   Anion gap 11  5 - 15  ETHANOL     Status: None   Collection Time    04/05/14 12:28 AM      Result Value Ref Range   Alcohol, Ethyl (B) <11  0 - 11 mg/dL   Comment:            LOWEST DETECTABLE LIMIT FOR     SERUM ALCOHOL IS 11 mg/dL     FOR MEDICAL PURPOSES ONLY  SALICYLATE LEVEL     Status: Abnormal   Collection Time    04/05/14 12:28 AM        Result Value Ref Range   Salicylate Lvl <9.5 (*) 2.8 - 20.0 mg/dL  URINE RAPID DRUG SCREEN (HOSP PERFORMED)     Status: Abnormal   Collection Time    04/05/14 12:39 AM      Result Value Ref Range    Opiates NONE DETECTED  NONE DETECTED   Cocaine NONE DETECTED  NONE DETECTED   Benzodiazepines NONE DETECTED  NONE DETECTED   Amphetamines NONE DETECTED  NONE DETECTED   Tetrahydrocannabinol POSITIVE (*) NONE DETECTED   Barbiturates NONE DETECTED  NONE DETECTED   Comment:            DRUG SCREEN FOR MEDICAL PURPOSES     ONLY.  IF CONFIRMATION IS NEEDED     FOR ANY PURPOSE, NOTIFY LAB     WITHIN 5 DAYS.                LOWEST DETECTABLE LIMITS     FOR URINE DRUG SCREEN     Drug Class       Cutoff (ng/mL)     Amphetamine      1000     Barbiturate      200     Benzodiazepine   621     Tricyclics       308     Opiates          300     Cocaine          300     THC              50   Labs are reviewed and are pertinent for negative for drugs or alcohol  Current Facility-Administered Medications  Medication Dose Route Frequency Provider Last Rate Last Dose  . albuterol (PROVENTIL HFA;VENTOLIN HFA) 108 (90 BASE) MCG/ACT inhaler 2 puff  2 puff Inhalation Q4H PRN Martie Lee, PA-C      . ARIPiprazole (ABILIFY) tablet 2 mg  2 mg Oral Daily Ruthell Rummage Dammen, PA-C   2 mg at 04/05/14 1011  . citalopram (CELEXA) tablet 20 mg  20 mg Oral Daily Ruthell Rummage Dammen, PA-C   20 mg at 04/05/14 1011  . diphenhydrAMINE (BENADRYL) capsule 25 mg  25 mg Oral Q6H PRN Martie Lee, PA-C      . loratadine (CLARITIN) tablet 10 mg  10 mg Oral Daily Ruthell Rummage Dammen, PA-C   10 mg at 04/05/14 1012  . montelukast (SINGULAIR) tablet 10 mg  10 mg Oral Daily Ruthell Rummage Dammen, PA-C   10 mg at 04/05/14 1011  . Oxcarbazepine (TRILEPTAL) tablet 300 mg  300 mg Oral BID Ruthell Rummage Dammen, PA-C   300 mg at 04/05/14 1011  . pramipexole (MIRAPEX) tablet 0.25 mg  0.25 mg Oral QHS Ruthell Rummage Dammen, PA-C   0.25 mg at 04/05/14 0128  . prazosin (MINIPRESS) capsule 1 mg  1 mg Oral BID Ruthell Rummage Dammen, PA-C   1 mg at 04/05/14 1011   Current Outpatient Prescriptions  Medication Sig Dispense Refill  . albuterol (VENTOLIN HFA) 108 (90 BASE) MCG/ACT  inhaler Inhale 2 puffs into the lungs every 4 (four) hours as needed for wheezing or shortness of breath.      . ARIPiprazole (ABILIFY) 2 MG tablet Take 1 tablet (2 mg total) by mouth daily. For mood control  30 tablet  0  . cetirizine (ZYRTEC) 10 MG tablet Take 1 tablet (10 mg total) by mouth 2 (two) times daily. For allergies      .  citalopram (CELEXA) 20 MG tablet Take 20 mg by mouth daily.      . diphenhydrAMINE (BENADRYL) 25 mg capsule Take 25 mg by mouth every 6 (six) hours as needed for allergies.      . montelukast (SINGULAIR) 10 MG tablet Take 1 tablet (10 mg total) by mouth daily. For asthma      . Oxcarbazepine (TRILEPTAL) 300 MG tablet Take 1 tablet (300 mg total) by mouth 2 (two) times daily. For mood stabilization  60 tablet  0  . pramipexole (MIRAPEX) 0.25 MG tablet Take 1 tablet (0.25 mg total) by mouth at bedtime. For restless legs  30 tablet  0  . prazosin (MINIPRESS) 1 MG capsule Take 1 capsule (1 mg total) by mouth 2 (two) times daily. For nightmares  60 capsule  0    Psychiatric Specialty Exam: Physical Exam  Constitutional: She is oriented to person, place, and time. She appears well-developed and well-nourished.  HENT:  Head: Normocephalic and atraumatic.  Neck: Normal range of motion. Neck supple.  Respiratory: Effort normal.  Musculoskeletal: Normal range of motion.  Neurological: She is alert and oriented to person, place, and time.  Skin: Skin is warm and dry.    Review of Systems  Constitutional: Positive for malaise/fatigue.  HENT: Negative.   Eyes: Negative.   Respiratory: Negative.   Cardiovascular: Negative.   Gastrointestinal: Negative.   Genitourinary: Negative.   Musculoskeletal: Negative.   Skin: Negative.   Neurological: Negative.   Endo/Heme/Allergies: Negative.   Psychiatric/Behavioral: Positive for depression and suicidal ideas.    Blood pressure 126/61, pulse 71, temperature 98.3 F (36.8 C), temperature source Oral, resp. rate 16, height  5' 5" (1.651 m), weight 210 lb (95.255 kg), last menstrual period 03/14/2014, SpO2 100.00%.Body mass index is 34.95 kg/(m^2).  General Appearance: Casual  Eye Contact::  Poor  Speech:  Normal Rate  Volume:  Decreased  Mood:  Depressed, Hopeless and Worthless  Affect:  Constricted, Depressed and Flat  Thought Process:  Coherent  Orientation:  Full (Time, Place, and Person)  Thought Content:  Rumination  Suicidal Thoughts:  Yes.  with intent/plan  Homicidal Thoughts:  No  Memory:  Immediate;   Fair Recent;   Fair Remote;   Fair  Judgement:  Poor  Insight:  Lacking  Psychomotor Activity:  Decreased  Concentration:  Fair  Recall:  Fair  Fund of Knowledge:Fair  Language: Good  Akathisia:  No  Handed:  Right  AIMS (if indicated):     Assets:  Communication Skills Desire for Improvement Physical Health Social Support  Sleep:      Musculoskeletal: Strength & Muscle Tone: within normal limits Gait & Station: normal Patient leans: N/A  Treatment Plan Summary: Daily contact with patient to assess and evaluate symptoms and progress in treatment Medication management Patient will be admitted for further stabilization  ,  04/05/2014 11:52 AM 

## 2014-04-05 NOTE — ED Notes (Signed)
Pt remains SI, but contracts for safety,  Will continue to monitor for safety.

## 2014-04-05 NOTE — ED Notes (Signed)
Pt. Belongings locked up in [facility] L[facility] # 43 near the psy-ed. Pt. Has 2 belongings bags.

## 2014-04-05 NOTE — BH Assessment (Signed)
Morgan [name] at [facility] says Pt is declined due to adult medicaid.  Harlin Rain Ria Comment, [facility] Triage Specialist [phone]

## 2014-04-06 DIAGNOSIS — F3130 Bipolar disorder, current episode depressed, mild or moderate severity, unspecified: Secondary | ICD-10-CM

## 2014-04-06 DIAGNOSIS — R45851 Suicidal ideations: Secondary | ICD-10-CM

## 2014-04-06 MED ORDER — LORAZEPAM 1 MG PO TABS
2.0000 mg | ORAL_TABLET | Freq: Once | ORAL | Status: AC
  Administered 2014-04-06: 2 mg via ORAL
  Filled 2014-04-06: qty 2

## 2014-04-06 MED ORDER — LORAZEPAM 1 MG PO TABS: 1.0000 mg | ORAL_TABLET | Freq: Four times a day (QID) | ORAL | Status: DC | PRN

## 2014-04-06 NOTE — Progress Notes (Signed)
Follow-up calls have been placed regarding bed availability for inptx: [facility]- per Deanna Artis will review and call back once review complete [facility]- per Victorino Dike now at capacity [facility]- per Coy Saunas now at capacity [facility]- at [facility]- per Sue Lush reviewing through referrals and will call once complete [facility]- per Darreld Mclean, MD has referrals and is reviewing, will call once complete [facility]- per [facility] reviewing [facility]- per [place] expecting some d/c's today will go through referrals to see if appropriate Turner Daniels- no answer [facility]- per Zella Ball at capacity [facility]- per Lonell Grandchild at capacity [facility]- per Archie Patten at capacity  The following facilities contacted are also at capacity: [facility]- per Fayrene Helper- per Lauro Regulus- per Vania Rea- per Bevelyn Ngo- per [facility]- per Karma Ganja- per Darl Pikes [facility]- per Jerrye Noble- per Arlys John   *pt has been declined at: [facility]   [facility] Disposition [facility]

## 2014-04-06 NOTE — ED Notes (Signed)
Pt remains passive SI, contracts for safety, will continue to monitor for safety.

## 2014-04-06 NOTE — ED Notes (Signed)
Pt tearful, states she remains SI, but wants to go home.

## 2014-04-06 NOTE — BH Assessment (Signed)
Morgan [name], [facility] at [facility], confirmed adult unit is currently at capacity. Contacted the following facilities for placement:  CLINICAL INFORMATION HAS BEEN FAXED AND IS UNDER REVIEW: [facility], per [facility], per Karie Mainland  AT CAPACITY: [facility], per [facility], per [facility], per [facility] per [facility], per [facility], per [facility], per [name], per [facility], per Jonetta Osgood, per [facility], per Lenice Pressman, per [employer]  MULTIPLE ATTEMPTS TO CONTACT WITH NO RESPONSE: [facility]  PT DECLINED: [facility] [address], [place], [place] [facility] Triage Specialist [phone]

## 2014-04-06 NOTE — Consult Note (Signed)
[facility] Face-to-Face Psychiatry Consult   Reason for Consult:  depression Referring Physician:  EDP SAYA [name] is an 22 y.o. female. Total Time spent with patient: 30 minutes  Assessment: AXIS I:  Bipolar, Depressed AXIS II:  Deferred AXIS III:   Past Medical History  Diagnosis Date  . Eczema   . TMJ (dislocation of temporomandibular joint)   . Depression   . ADHD (attention deficit hyperactivity disorder)   . PTSD (post-traumatic stress disorder)   . Anxiety   . Asthma    AXIS IV:  other psychosocial or environmental problems AXIS V:  11-20 some danger of hurting self or others possible OR occasionally fails to maintain minimal personal hygiene OR gross impairment in communication  Plan:  Recommend psychiatric Inpatient admission when medically cleared.  Subjective:   Morgan [name] is a 23 y.o. female patient admitted with depressed mood and suicidal ideation.  HPI:  Patient states she has a history of bipolar disorder but recently has been depressed. She claims she had a manic episode several months ago and spent a lot of money on credit cards. She feels ashamed and guilty about it. She lives with boyfriend and he pays the bills. She feels guilty about this and unworthy. Lately she has been more depressed.reports energy is low, eating and sleeping are variable. Mood is low and she feels suicidal with plan to take an overdose of pills. She attends [facility] and take medications -Abilify, trileptal and Celexa as prescribed. She doesn't think they are working. Denies current drug or alcohol use. No AH/VH  Patient seen again today, 04/06/14. She is lethargic, depressed still feels suicidal. She cannot contract for safety HPI Elements:   Location:  global. Quality:  severe. Severity:  severe. Timing:  months. Duration:  several weeks. Context:  guilt about overspending.  Past Psychiatric History: Past Medical History  Diagnosis Date  . Eczema   . TMJ (dislocation of  temporomandibular joint)   . Depression   . ADHD (attention deficit hyperactivity disorder)   . PTSD (post-traumatic stress disorder)   . Anxiety   . Asthma     reports that she has quit smoking. Her smoking use included Cigarettes. She has a 1 pack-year smoking history. She uses smokeless tobacco. She reports that she drinks about 1.2 ounces of alcohol per week. She reports that she uses illicit drugs (Marijuana). Family History  Problem Relation Age of Onset  . Depression Mother   . Hypertension Mother   . Hypertension Maternal Grandmother   . Hypertension Maternal Grandfather   . Diabetes Maternal Grandfather   . Hypertension Paternal Grandmother   . Diabetes Paternal Grandmother   . Heart murmur Mother            Allergies:   Allergies  Allergen Reactions  . Food Anaphylaxis    Melons: any type in the melon family with cause anaphylaxis   . Peanut-Containing Drug Products Anaphylaxis  . Soy Allergy Anaphylaxis  . Geodon [Ziprasidone Hcl] Other (See Comments)    Hypersomnolence. All atypical antipsychotics.     . Lactose Intolerance (Gi) Other (See Comments)    Reaction: GI upset  . Vitamin C Itching    Most food which have vitamin c causes itching  . Sulfa Antibiotics Rash    Childhood allergy     Objective: Blood pressure 107/60, pulse 71, temperature 98.5 F (36.9 C), temperature source Oral, resp. rate 17, height '5\' 5"'  (1.651 m), weight 210 lb (95.255 kg), last menstrual period 03/14/2014,  SpO2 100.00%.Body mass index is 34.95 kg/(m^2). Results for orders placed during the hospital encounter of 04/04/14 (from the past 72 hour(s))  ACETAMINOPHEN LEVEL     Status: None   Collection Time    04/05/14 12:28 AM      Result Value Ref Range   Acetaminophen (Tylenol), Serum <15.0  10 - 30 ug/mL   Comment:            THERAPEUTIC CONCENTRATIONS VARY     SIGNIFICANTLY. A RANGE OF 10-30     ug/mL MAY BE AN EFFECTIVE     CONCENTRATION FOR MANY PATIENTS.     HOWEVER,  SOME ARE BEST TREATED     AT CONCENTRATIONS OUTSIDE THIS     RANGE.     ACETAMINOPHEN CONCENTRATIONS     >150 ug/mL AT 4 HOURS AFTER     INGESTION AND >50 ug/mL AT 12     HOURS AFTER INGESTION ARE     OFTEN ASSOCIATED WITH TOXIC     REACTIONS.  CBC     Status: None   Collection Time    04/05/14 12:28 AM      Result Value Ref Range   WBC 8.1  4.0 - 10.5 K/uL   RBC 4.87  3.87 - 5.11 MIL/uL   Hemoglobin 13.4  12.0 - 15.0 g/dL   HCT 39.6  36.0 - 46.0 %   MCV 81.3  78.0 - 100.0 fL   MCH 27.5  26.0 - 34.0 pg   MCHC 33.8  30.0 - 36.0 g/dL   RDW 15.0  11.5 - 15.5 %   Platelets 238  150 - 400 K/uL  COMPREHENSIVE METABOLIC PANEL     Status: Abnormal   Collection Time    04/05/14 12:28 AM      Result Value Ref Range   Sodium 139  137 - 147 mEq/L   Potassium 4.3  3.7 - 5.3 mEq/L   Chloride 103  96 - 112 mEq/L   CO2 25  19 - 32 mEq/L   Glucose, Bld 80  70 - 99 mg/dL   BUN 11  6 - 23 mg/dL   Creatinine, Ser 0.77  0.50 - 1.10 mg/dL   Calcium 9.8  8.4 - 10.5 mg/dL   Total Protein 7.8  6.0 - 8.3 g/dL   Albumin 3.8  3.5 - 5.2 g/dL   AST 14  0 - 37 U/L   ALT 13  0 - 35 U/L   Alkaline Phosphatase 71  39 - 117 U/L   Total Bilirubin <0.2 (*) 0.3 - 1.2 mg/dL   GFR calc non Af Amer >90  >90 mL/min   GFR calc Af Amer >90  >90 mL/min   Comment: (NOTE)     The eGFR has been calculated using the CKD EPI equation.     This calculation has not been validated in all clinical situations.     eGFR's persistently <90 mL/min signify possible Chronic Kidney     Disease.   Anion gap 11  5 - 15  ETHANOL     Status: None   Collection Time    04/05/14 12:28 AM      Result Value Ref Range   Alcohol, Ethyl (B) <11  0 - 11 mg/dL   Comment:            LOWEST DETECTABLE LIMIT FOR     SERUM ALCOHOL IS 11 mg/dL     FOR MEDICAL PURPOSES ONLY  SALICYLATE LEVEL  Status: Abnormal   Collection Time    04/05/14 12:28 AM      Result Value Ref Range   Salicylate Lvl <8.0 (*) 2.8 - 20.0 mg/dL  URINE RAPID  DRUG SCREEN (HOSP PERFORMED)     Status: Abnormal   Collection Time    04/05/14 12:39 AM      Result Value Ref Range   Opiates NONE DETECTED  NONE DETECTED   Cocaine NONE DETECTED  NONE DETECTED   Benzodiazepines NONE DETECTED  NONE DETECTED   Amphetamines NONE DETECTED  NONE DETECTED   Tetrahydrocannabinol POSITIVE (*) NONE DETECTED   Barbiturates NONE DETECTED  NONE DETECTED   Comment:            DRUG SCREEN FOR MEDICAL PURPOSES     ONLY.  IF CONFIRMATION IS NEEDED     FOR ANY PURPOSE, NOTIFY LAB     WITHIN 5 DAYS.                LOWEST DETECTABLE LIMITS     FOR URINE DRUG SCREEN     Drug Class       Cutoff (ng/mL)     Amphetamine      1000     Barbiturate      200     Benzodiazepine   165     Tricyclics       537     Opiates          300     Cocaine          300     THC              50   Labs are reviewed and are pertinent for negative for drugs or alcohol  Current Facility-Administered Medications  Medication Dose Route Frequency Provider Last Rate Last Dose  . albuterol (PROVENTIL HFA;VENTOLIN HFA) 108 (90 BASE) MCG/ACT inhaler 2 puff  2 puff Inhalation Q4H PRN Martie Lee, PA-C      . ARIPiprazole (ABILIFY) tablet 2 mg  2 mg Oral Daily Ruthell Rummage Dammen, PA-C   2 mg at 04/06/14 0909  . citalopram (CELEXA) tablet 20 mg  20 mg Oral Daily Ruthell Rummage Dammen, PA-C   20 mg at 04/06/14 4827  . diphenhydrAMINE (BENADRYL) capsule 25 mg  25 mg Oral Q6H PRN Martie Lee, PA-C      . loratadine (CLARITIN) tablet 10 mg  10 mg Oral Daily Ruthell Rummage Dammen, PA-C   10 mg at 04/06/14 0786  . montelukast (SINGULAIR) tablet 10 mg  10 mg Oral Daily Martie Lee, PA-C   10 mg at 04/06/14 7544  . Oxcarbazepine (TRILEPTAL) tablet 300 mg  300 mg Oral BID Ruthell Rummage Dammen, PA-C   300 mg at 04/06/14 9201  . pramipexole (MIRAPEX) tablet 0.25 mg  0.25 mg Oral QHS Ruthell Rummage Dammen, PA-C   0.25 mg at 04/05/14 2134  . prazosin (MINIPRESS) capsule 1 mg  1 mg Oral BID Martie Lee, PA-C   1 mg at 04/06/14  0071   Current Outpatient Prescriptions  Medication Sig Dispense Refill  . albuterol (VENTOLIN HFA) 108 (90 BASE) MCG/ACT inhaler Inhale 2 puffs into the lungs every 4 (four) hours as needed for wheezing or shortness of breath.      . ARIPiprazole (ABILIFY) 2 MG tablet Take 1 tablet (2 mg total) by mouth daily. For mood control  30 tablet  0  . cetirizine (ZYRTEC) 10 MG tablet Take 1  tablet (10 mg total) by mouth 2 (two) times daily. For allergies      . citalopram (CELEXA) 20 MG tablet Take 20 mg by mouth daily.      . diphenhydrAMINE (BENADRYL) 25 mg capsule Take 25 mg by mouth every 6 (six) hours as needed for allergies.      . montelukast (SINGULAIR) 10 MG tablet Take 1 tablet (10 mg total) by mouth daily. For asthma      . Oxcarbazepine (TRILEPTAL) 300 MG tablet Take 1 tablet (300 mg total) by mouth 2 (two) times daily. For mood stabilization  60 tablet  0  . pramipexole (MIRAPEX) 0.25 MG tablet Take 1 tablet (0.25 mg total) by mouth at bedtime. For restless legs  30 tablet  0  . prazosin (MINIPRESS) 1 MG capsule Take 1 capsule (1 mg total) by mouth 2 (two) times daily. For nightmares  60 capsule  0    Psychiatric Specialty Exam: Physical Exam  Constitutional: She is oriented to person, place, and time. She appears well-developed and well-nourished.  HENT:  Head: Normocephalic and atraumatic.  Neck: Normal range of motion. Neck supple.  Respiratory: Effort normal.  Musculoskeletal: Normal range of motion.  Neurological: She is alert and oriented to person, place, and time.  Skin: Skin is warm and dry.    Review of Systems  Constitutional: Positive for malaise/fatigue.  HENT: Negative.   Eyes: Negative.   Respiratory: Negative.   Cardiovascular: Negative.   Gastrointestinal: Negative.   Genitourinary: Negative.   Musculoskeletal: Negative.   Skin: Negative.   Neurological: Negative.   Endo/Heme/Allergies: Negative.   Psychiatric/Behavioral: Positive for depression and  suicidal ideas.    Blood pressure 107/60, pulse 71, temperature 98.5 F (36.9 C), temperature source Oral, resp. rate 17, height '5\' 5"'  (1.651 m), weight 210 lb (95.255 kg), last menstrual period 03/14/2014, SpO2 100.00%.Body mass index is 34.95 kg/(m^2).  General Appearance: Casual  Eye Contact::  Poor  Speech:  Normal Rate  Volume:  Decreased  Mood:  Depressed, Hopeless and Worthless  Affect:  Constricted, Depressed and Flat  Thought Process:  Coherent  Orientation:  Full (Time, Place, and Person)  Thought Content:  Rumination  Suicidal Thoughts:  Yes.  with intent/plan  Homicidal Thoughts:  No  Memory:  Immediate;   Fair Recent;   Fair Remote;   Fair  Judgement:  Poor  Insight:  Lacking  Psychomotor Activity:  Decreased  Concentration:  Fair  Recall:  [employer] of Knowledge:Fair  Language: Good  Akathisia:  No  Handed:  Right  AIMS (if indicated):     Assets:  Communication Skills Desire for Improvement Physical Health Social Support  Sleep:      Musculoskeletal: Strength & Muscle Tone: within normal limits Gait & Station: normal Patient leans: N/A  Treatment Plan Summary: Daily contact with patient to assess and evaluate symptoms and progress in treatment Medication management Patient will be admitted for further stabilization We are still actively seeking placement  [facility], [facility] 04/06/2014 12:43 PM

## 2014-04-07 ENCOUNTER — Encounter (HOSPITAL_COMMUNITY): Payer: Self-pay

## 2014-04-07 ENCOUNTER — Inpatient Hospital Stay (HOSPITAL_COMMUNITY)
Admission: AD | Admit: 2014-04-07 | Discharge: 2014-04-15 | DRG: 885 | Disposition: A | Discharge status: Home / Self Care | Payer: Federal, State, Local not specified - Other | Source: Intra-hospital | Attending: Psychiatry | Admitting: Psychiatry

## 2014-04-07 DIAGNOSIS — F319 Bipolar disorder, unspecified: Secondary | ICD-10-CM

## 2014-04-07 DIAGNOSIS — F3130 Bipolar disorder, current episode depressed, mild or moderate severity, unspecified: Secondary | ICD-10-CM | POA: Diagnosis present

## 2014-04-07 DIAGNOSIS — F4310 Post-traumatic stress disorder, unspecified: Secondary | ICD-10-CM

## 2014-04-07 DIAGNOSIS — G2581 Restless legs syndrome: Secondary | ICD-10-CM | POA: Diagnosis present

## 2014-04-07 DIAGNOSIS — F909 Attention-deficit hyperactivity disorder, unspecified type: Secondary | ICD-10-CM

## 2014-04-07 DIAGNOSIS — J45909 Unspecified asthma, uncomplicated: Secondary | ICD-10-CM | POA: Diagnosis present

## 2014-04-07 DIAGNOSIS — Z87891 Personal history of nicotine dependence: Secondary | ICD-10-CM

## 2014-04-07 DIAGNOSIS — G4700 Insomnia, unspecified: Secondary | ICD-10-CM | POA: Diagnosis present

## 2014-04-07 DIAGNOSIS — F410 Panic disorder [episodic paroxysmal anxiety] without agoraphobia: Secondary | ICD-10-CM | POA: Diagnosis present

## 2014-04-07 DIAGNOSIS — Z833 Family history of diabetes mellitus: Secondary | ICD-10-CM

## 2014-04-07 DIAGNOSIS — F411 Generalized anxiety disorder: Secondary | ICD-10-CM | POA: Diagnosis present

## 2014-04-07 DIAGNOSIS — Z8249 Family history of ischemic heart disease and other diseases of the circulatory system: Secondary | ICD-10-CM | POA: Diagnosis not present

## 2014-04-07 DIAGNOSIS — R45851 Suicidal ideations: Secondary | ICD-10-CM | POA: Diagnosis not present

## 2014-04-07 DIAGNOSIS — F314 Bipolar disorder, current episode depressed, severe, without psychotic features: Secondary | ICD-10-CM | POA: Diagnosis present

## 2014-04-07 DIAGNOSIS — IMO0001 Reserved for inherently not codable concepts without codable children: Secondary | ICD-10-CM | POA: Diagnosis present

## 2014-04-07 DIAGNOSIS — F329 Major depressive disorder, single episode, unspecified: Secondary | ICD-10-CM | POA: Diagnosis present

## 2014-04-07 MED ORDER — MAGNESIUM HYDROXIDE 400 MG/5ML PO SUSP: 30.0000 mL | Freq: Every day | ORAL | Status: DC | PRN

## 2014-04-07 MED ORDER — OXCARBAZEPINE 300 MG PO TABS
300.0000 mg | ORAL_TABLET | Freq: Two times a day (BID) | ORAL | Status: DC
  Administered 2014-04-07 – 2014-04-15 (×16): 300 mg via ORAL
  Filled 2014-04-07 (×3): qty 1
  Filled 2014-04-07: qty 28
  Filled 2014-04-07 (×8): qty 1
  Filled 2014-04-07: qty 28
  Filled 2014-04-07 (×5): qty 1
  Filled 2014-04-07: qty 2
  Filled 2014-04-07 (×2): qty 1

## 2014-04-07 MED ORDER — ACETAMINOPHEN 325 MG PO TABS: 650.0000 mg | ORAL_TABLET | Freq: Four times a day (QID) | ORAL | Status: DC | PRN

## 2014-04-07 MED ORDER — PRAZOSIN HCL 2 MG PO CAPS
2.0000 mg | ORAL_CAPSULE | Freq: Every day | ORAL | Status: DC
  Administered 2014-04-08 – 2014-04-14 (×7): 2 mg via ORAL
  Filled 2014-04-07 (×6): qty 1
  Filled 2014-04-07: qty 14
  Filled 2014-04-07 (×2): qty 1

## 2014-04-07 MED ORDER — PRAZOSIN HCL 2 MG PO CAPS: 2.0000 mg | ORAL_CAPSULE | Freq: Every day | ORAL | Status: DC

## 2014-04-07 MED ORDER — DIPHENHYDRAMINE HCL 25 MG PO CAPS
25.0000 mg | ORAL_CAPSULE | Freq: Four times a day (QID) | ORAL | Status: DC | PRN
  Filled 2014-04-07: qty 20

## 2014-04-07 MED ORDER — MONTELUKAST SODIUM 10 MG PO TABS
10.0000 mg | ORAL_TABLET | Freq: Every day | ORAL | Status: DC
  Administered 2014-04-08 – 2014-04-15 (×8): 10 mg via ORAL
  Filled 2014-04-07 (×4): qty 1
  Filled 2014-04-07: qty 14
  Filled 2014-04-07 (×5): qty 1

## 2014-04-07 MED ORDER — ALBUTEROL SULFATE HFA 108 (90 BASE) MCG/ACT IN AERS: 2.0000 | INHALATION_SPRAY | RESPIRATORY_TRACT | Status: DC | PRN

## 2014-04-07 MED ORDER — ARIPIPRAZOLE 5 MG PO TABS
5.0000 mg | ORAL_TABLET | Freq: Every day | ORAL | Status: DC
  Administered 2014-04-08 – 2014-04-09 (×2): 5 mg via ORAL
  Filled 2014-04-07 (×4): qty 1

## 2014-04-07 MED ORDER — ALUM & MAG HYDROXIDE-SIMETH 200-200-20 MG/5ML PO SUSP: 30.0000 mL | ORAL | Status: DC | PRN

## 2014-04-07 MED ORDER — ARIPIPRAZOLE 5 MG PO TABS: 5.0000 mg | ORAL_TABLET | Freq: Every day | ORAL | Status: DC

## 2014-04-07 MED ORDER — CITALOPRAM HYDROBROMIDE 20 MG PO TABS
20.0000 mg | ORAL_TABLET | Freq: Every day | ORAL | Status: DC
  Administered 2014-04-08 – 2014-04-09 (×2): 20 mg via ORAL
  Filled 2014-04-07 (×4): qty 1

## 2014-04-07 MED ORDER — NICOTINE 21 MG/24HR TD PT24
21.0000 mg | MEDICATED_PATCH | Freq: Every day | TRANSDERMAL | Status: DC
  Administered 2014-04-08 – 2014-04-15 (×7): 21 mg via TRANSDERMAL
  Filled 2014-04-07 (×10): qty 1

## 2014-04-07 MED ORDER — PRAMIPEXOLE DIHYDROCHLORIDE 0.25 MG PO TABS
0.2500 mg | ORAL_TABLET | Freq: Every day | ORAL | Status: DC
  Administered 2014-04-07 – 2014-04-09 (×3): 0.25 mg via ORAL
  Filled 2014-04-07 (×6): qty 1

## 2014-04-07 MED ORDER — ACETAMINOPHEN 325 MG PO TABS
650.0000 mg | ORAL_TABLET | Freq: Four times a day (QID) | ORAL | Status: DC | PRN
Start: 2014-04-07 — End: 2014-04-15
  Administered 2014-04-11: 650 mg via ORAL
  Filled 2014-04-07: qty 2

## 2014-04-07 MED ORDER — LORAZEPAM 1 MG PO TABS
1.0000 mg | ORAL_TABLET | Freq: Four times a day (QID) | ORAL | Status: DC | PRN
  Administered 2014-04-07 – 2014-04-13 (×4): 1 mg via ORAL
  Filled 2014-04-07 (×4): qty 1

## 2014-04-07 MED ORDER — LORATADINE 10 MG PO TABS
10.0000 mg | ORAL_TABLET | Freq: Every day | ORAL | Status: DC
  Administered 2014-04-08 – 2014-04-15 (×8): 10 mg via ORAL
  Filled 2014-04-07 (×10): qty 1

## 2014-04-07 NOTE — Tx Team (Signed)
Initial Interdisciplinary Treatment Plan   PATIENT STRESSORS: Financial difficulties Marital or family conflict   PROBLEM LIST: Problem List/Patient Goals Date to be addressed Date deferred Reason deferred Estimated date of resolution  depression 04/07/14     anxiety 04/07/14     Suicidal ideation 04/07/14                                          DISCHARGE CRITERIA:  Ability to meet basic life and health needs Adequate post-discharge living arrangements Improved stabilization in mood, thinking, and/or behavior Medical problems require only outpatient monitoring Motivation to continue treatment in a less acute level of care Need for constant or close observation no longer present Reduction of life-threatening or endangering symptoms to within safe limits Safe-care adequate arrangements made Verbal commitment to aftercare and medication compliance  PRELIMINARY DISCHARGE PLAN: Outpatient therapy Return to previous living arrangement  PATIENT/FAMIILY INVOLVEMENT: This treatment plan has been presented to and reviewed with the patient, Morgan [name], and/or family member.  The patient and family have been given the opportunity to ask questions and make suggestions.  Loura Halt K 04/07/2014, 6:02 PM

## 2014-04-07 NOTE — ED Notes (Signed)
Patient transferred to [facility].  Left the unit ambulatory with [place] driver.  All belongings given to driver.

## 2014-04-07 NOTE — ED Notes (Signed)
Report called to Loura Halt, RN at [facility].

## 2014-04-07 NOTE — Progress Notes (Signed)
Patient meets criteria for inpatient hospitalization per, Dr. Jannifer Franklin.  Patient accepted to [facility] Bed 500-1.  Writer faxed support paperwork to [facility].

## 2014-04-07 NOTE — BH Assessment (Signed)
Per Binnie Rail, [facility] at [facility], adult unit is currently at capacity. Contacted the following facilities for placement:  CURRENTLY AT CAPACITY: [facility], per [facility], per Earl Lites, per [facility], per [facility], per [name], per [facility], per [facility], Per [facility], per [facility], per [facility], per [facility], per [facility], per [facility], per Kenni[facility] Rad, per [facility], per Claud[facility]  INFORMATION HAS BEEN SENT TO THESE FACILITIES FOR WAIT LIST:  [facility]  PT DECLINED: [employer] [facility] [address] [place], [place], [facility] Triage Specialist [phone]

## 2014-04-07 NOTE — Progress Notes (Signed)
Admission: Patient admitted from [facility]. Patient has been feeling suicidal off and on, with a plan to hang herself. The patient has a history of bipolar disorder and recently lost her job due to manic symptoms. She has a history of prior admissions and has a history of a suicide attempt by overdosing on multiple medications. She is living with a guy now who pays her bills whom she says believes he is her boyfriend but she says that they are not together. She has a history of substance abuse but is not using anything now except marijuana. She says she has not used in 19 days but UDS positive for marijuana. Patient is able to contract for safety at this time.

## 2014-04-07 NOTE — Consult Note (Signed)
[facility] Face-to-Face Psychiatry Consult   Reason for Consult:  Depression and suicide thoughts with plan to hang self Referring Physician:  EDP Morgan [name] is an 23 y.o. female. Total Time spent with patient: 30 minutes  Assessment: AXIS I:  Bipolar, Depressed AXIS II:  Deferred AXIS III:   Past Medical History  Diagnosis Date  . Eczema   . TMJ (dislocation of temporomandibular joint)   . ADHD (attention deficit hyperactivity disorder)   . PTSD (post-traumatic stress disorder)   . Asthma    AXIS IV:  other psychosocial or environmental problems AXIS V:  11-20 some danger of hurting self or others possible OR occasionally fails to maintain minimal personal hygiene OR gross impairment in communication  Plan:  Recommend psychiatric Inpatient admission when medically cleared.  Subjective:   Morgan [name] is a 23 y.o. female patient present to ED with depressed mood and suicidal ideation with plan  HPI:  Patient with history of bipolar disorder but recently has been depressed. She claims she had a manic episode several months ago and spent a lot of money on credit cards. She feels ashamed and guilty about it. She lives with boyfriend and he pays the bills. She feels guilty about this and unworthy. Lately she has been more depressed, having difficulty sleeping, crying spells, racing thoughts, mood swings, low energy level and recurrent suicidal thoughts  with plan to take an overdose of pills. She attends [facility] for outpatient and take medications -Abilify, trileptal and Celexa as prescribed. She doesn't think they are working. Denies current drug or alcohol use.  Denies Auditory/visual hallucinations. Patient is unable to contract for safety and would benefit from inpatient admission.  HPI Elements:   Location:  Depression, suicidal Quality:  Severe episode Timing:  months. Duration:  several weeks. Context:  guilt about overspending.  Past Psychiatric History: Past Medical  History  Diagnosis Date  . Eczema   . TMJ (dislocation of temporomandibular joint)   . Depression   . ADHD (attention deficit hyperactivity disorder)   . PTSD (post-traumatic stress disorder)   . Anxiety   . Asthma     reports that she has quit smoking. Her smoking use included Cigarettes. She has a 1 pack-year smoking history. She uses smokeless tobacco. She reports that she drinks about 1.2 ounces of alcohol per week. She reports that she uses illicit drugs (Marijuana). Family History  Problem Relation Age of Onset  . Depression Mother   . Hypertension Mother   . Hypertension Maternal Grandmother   . Hypertension Maternal Grandfather   . Diabetes Maternal Grandfather   . Hypertension Paternal Grandmother   . Diabetes Paternal Grandmother   . Heart murmur Mother            Allergies:   Allergies  Allergen Reactions  . Food Anaphylaxis    Melons: any type in the melon family with cause anaphylaxis   . Peanut-Containing Drug Products Anaphylaxis  . Soy Allergy Anaphylaxis  . Geodon [Ziprasidone Hcl] Other (See Comments)    Hypersomnolence. All atypical antipsychotics.     . Lactose Intolerance (Gi) Other (See Comments)    Reaction: GI upset  . Vitamin C Itching    Most food which have vitamin c causes itching  . Sulfa Antibiotics Rash    Childhood allergy     Objective: Blood pressure 126/66, pulse 65, temperature 98.6 F (37 C), temperature source Oral, resp. rate 16, height '5\' 5"'  (1.651 m), weight 95.255 kg (210 lb), last  menstrual period 03/14/2014, SpO2 99.00%.Body mass index is 34.95 kg/(m^2). Results for orders placed during the hospital encounter of 04/04/14 (from the past 72 hour(s))  ACETAMINOPHEN LEVEL     Status: None   Collection Time    04/05/14 12:28 AM      Result Value Ref Range   Acetaminophen (Tylenol), Serum <15.0  10 - 30 ug/mL   Comment:            THERAPEUTIC CONCENTRATIONS VARY     SIGNIFICANTLY. A RANGE OF 10-30     ug/mL MAY BE AN  EFFECTIVE     CONCENTRATION FOR MANY PATIENTS.     HOWEVER, SOME ARE BEST TREATED     AT CONCENTRATIONS OUTSIDE THIS     RANGE.     ACETAMINOPHEN CONCENTRATIONS     >150 ug/mL AT 4 HOURS AFTER     INGESTION AND >50 ug/mL AT 12     HOURS AFTER INGESTION ARE     OFTEN ASSOCIATED WITH TOXIC     REACTIONS.  CBC     Status: None   Collection Time    04/05/14 12:28 AM      Result Value Ref Range   WBC 8.1  4.0 - 10.5 K/uL   RBC 4.87  3.87 - 5.11 MIL/uL   Hemoglobin 13.4  12.0 - 15.0 g/dL   HCT 39.6  36.0 - 46.0 %   MCV 81.3  78.0 - 100.0 fL   MCH 27.5  26.0 - 34.0 pg   MCHC 33.8  30.0 - 36.0 g/dL   RDW 15.0  11.5 - 15.5 %   Platelets 238  150 - 400 K/uL  COMPREHENSIVE METABOLIC PANEL     Status: Abnormal   Collection Time    04/05/14 12:28 AM      Result Value Ref Range   Sodium 139  137 - 147 mEq/L   Potassium 4.3  3.7 - 5.3 mEq/L   Chloride 103  96 - 112 mEq/L   CO2 25  19 - 32 mEq/L   Glucose, Bld 80  70 - 99 mg/dL   BUN 11  6 - 23 mg/dL   Creatinine, Ser 0.77  0.50 - 1.10 mg/dL   Calcium 9.8  8.4 - 10.5 mg/dL   Total Protein 7.8  6.0 - 8.3 g/dL   Albumin 3.8  3.5 - 5.2 g/dL   AST 14  0 - 37 U/L   ALT 13  0 - 35 U/L   Alkaline Phosphatase 71  39 - 117 U/L   Total Bilirubin <0.2 (*) 0.3 - 1.2 mg/dL   GFR calc non Af Amer >90  >90 mL/min   GFR calc Af Amer >90  >90 mL/min   Comment: (NOTE)     The eGFR has been calculated using the CKD EPI equation.     This calculation has not been validated in all clinical situations.     eGFR's persistently <90 mL/min signify possible Chronic Kidney     Disease.   Anion gap 11  5 - 15  ETHANOL     Status: None   Collection Time    04/05/14 12:28 AM      Result Value Ref Range   Alcohol, Ethyl (B) <11  0 - 11 mg/dL   Comment:            LOWEST DETECTABLE LIMIT FOR     SERUM ALCOHOL IS 11 mg/dL     FOR MEDICAL PURPOSES ONLY  SALICYLATE LEVEL  Status: Abnormal   Collection Time    04/05/14 12:28 AM      Result Value Ref  Range   Salicylate Lvl <7.0 (*) 2.8 - 20.0 mg/dL  URINE RAPID DRUG SCREEN (HOSP PERFORMED)     Status: Abnormal   Collection Time    04/05/14 12:39 AM      Result Value Ref Range   Opiates NONE DETECTED  NONE DETECTED   Cocaine NONE DETECTED  NONE DETECTED   Benzodiazepines NONE DETECTED  NONE DETECTED   Amphetamines NONE DETECTED  NONE DETECTED   Tetrahydrocannabinol POSITIVE (*) NONE DETECTED   Barbiturates NONE DETECTED  NONE DETECTED   Comment:            DRUG SCREEN FOR MEDICAL PURPOSES     ONLY.  IF CONFIRMATION IS NEEDED     FOR ANY PURPOSE, NOTIFY LAB     WITHIN 5 DAYS.                LOWEST DETECTABLE LIMITS     FOR URINE DRUG SCREEN     Drug Class       Cutoff (ng/mL)     Amphetamine      1000     Barbiturate      200     Benzodiazepine   488     Tricyclics       891     Opiates          300     Cocaine          300     THC              50   Labs are reviewed and are pertinent for negative for drugs or alcohol  Current Facility-Administered Medications  Medication Dose Route Frequency Provider Last Rate Last Dose  . albuterol (PROVENTIL HFA;VENTOLIN HFA) 108 (90 BASE) MCG/ACT inhaler 2 puff  2 puff Inhalation Q4H PRN Martie Lee, PA-C      . ARIPiprazole (ABILIFY) tablet 2 mg  2 mg Oral Daily Ruthell Rummage Dammen, PA-C   2 mg at 04/07/14 6945  . citalopram (CELEXA) tablet 20 mg  20 mg Oral Daily Ruthell Rummage Dammen, PA-C   20 mg at 04/07/14 0388  . diphenhydrAMINE (BENADRYL) capsule 25 mg  25 mg Oral Q6H PRN Martie Lee, PA-C   25 mg at 04/06/14 2131  . loratadine (CLARITIN) tablet 10 mg  10 mg Oral Daily Ruthell Rummage Dammen, PA-C   10 mg at 04/07/14 0959  . LORazepam (ATIVAN) tablet 1 mg  1 mg Oral Q6H PRN Shaune Pollack, MD      . montelukast (SINGULAIR) tablet 10 mg  10 mg Oral Daily Ruthell Rummage Dammen, PA-C   10 mg at 04/07/14 8280  . Oxcarbazepine (TRILEPTAL) tablet 300 mg  300 mg Oral BID Ruthell Rummage Dammen, PA-C   300 mg at 04/07/14 0349  . pramipexole (MIRAPEX) tablet 0.25 mg   0.25 mg Oral QHS Ruthell Rummage Dammen, PA-C   0.25 mg at 04/06/14 2131  . prazosin (MINIPRESS) capsule 1 mg  1 mg Oral BID Ruthell Rummage Dammen, PA-C   1 mg at 04/07/14 1791   Current Outpatient Prescriptions  Medication Sig Dispense Refill  . albuterol (VENTOLIN HFA) 108 (90 BASE) MCG/ACT inhaler Inhale 2 puffs into the lungs every 4 (four) hours as needed for wheezing or shortness of breath.      . ARIPiprazole (ABILIFY) 2 MG tablet Take 1  tablet (2 mg total) by mouth daily. For mood control  30 tablet  0  . cetirizine (ZYRTEC) 10 MG tablet Take 1 tablet (10 mg total) by mouth 2 (two) times daily. For allergies      . citalopram (CELEXA) 20 MG tablet Take 20 mg by mouth daily.      . diphenhydrAMINE (BENADRYL) 25 mg capsule Take 25 mg by mouth every 6 (six) hours as needed for allergies.      . montelukast (SINGULAIR) 10 MG tablet Take 1 tablet (10 mg total) by mouth daily. For asthma      . Oxcarbazepine (TRILEPTAL) 300 MG tablet Take 1 tablet (300 mg total) by mouth 2 (two) times daily. For mood stabilization  60 tablet  0  . pramipexole (MIRAPEX) 0.25 MG tablet Take 1 tablet (0.25 mg total) by mouth at bedtime. For restless legs  30 tablet  0  . prazosin (MINIPRESS) 1 MG capsule Take 1 capsule (1 mg total) by mouth 2 (two) times daily. For nightmares  60 capsule  0    Psychiatric Specialty Exam: Physical Exam  Constitutional: She is oriented to person, place, and time. She appears well-developed and well-nourished.  HENT:  Head: Normocephalic and atraumatic.  Neck: Normal range of motion. Neck supple.  Respiratory: Effort normal.  Musculoskeletal: Normal range of motion.  Neurological: She is alert and oriented to person, place, and time.  Skin: Skin is warm and dry.    Review of Systems  Constitutional: Positive for malaise/fatigue.  HENT: Negative.   Eyes: Negative.   Respiratory: Negative.   Cardiovascular: Negative.   Gastrointestinal: Negative.   Genitourinary: Negative.    Musculoskeletal: Negative.   Skin: Negative.   Neurological: Negative.   Endo/Heme/Allergies: Negative.   Psychiatric/Behavioral: Positive for depression and suicidal ideas.    Blood pressure 126/66, pulse 65, temperature 98.6 F (37 C), temperature source Oral, resp. rate 16, height '5\' 5"'  (1.651 m), weight 95.255 kg (210 lb), last menstrual period 03/14/2014, SpO2 99.00%.Body mass index is 34.95 kg/(m^2).  General Appearance: Casual  Eye Contact::  Poor  Speech:  Normal Rate  Volume:  Decreased  Mood:  Depressed, Hopeless and Worthless  Affect:  Constricted, Depressed and Flat  Thought Process:  Coherent  Orientation:  Full (Time, Place, and Person)  Thought Content:  Rumination  Suicidal Thoughts:  Yes.  with intent/plan  Homicidal Thoughts:  No  Memory:  Immediate;   Fair Recent;   Fair Remote;   Fair  Judgement:  Poor  Insight:  Lacking  Psychomotor Activity:  Decreased  Concentration:  Fair  Recall:  [employer] of Knowledge:Fair  Language: Good  Akathisia:  No  Handed:  Right  AIMS (if indicated):     Assets:  Communication Skills Desire for Improvement Physical Health Social Support  Sleep:      Musculoskeletal: Strength & Muscle Tone: within normal limits Gait & Station: normal Patient leans: N/A  Treatment Plan Summary: Daily contact with patient to assess and evaluate symptoms and progress in treatment Medication management Patient will be admitted for further stabilization We are still actively seeking inpatient placement  Corena Pilgrim, MD 04/07/2014 1:50 PM

## 2014-04-08 ENCOUNTER — Encounter (HOSPITAL_COMMUNITY): Payer: Self-pay | Admitting: Psychiatry

## 2014-04-08 DIAGNOSIS — R45851 Suicidal ideations: Secondary | ICD-10-CM

## 2014-04-08 DIAGNOSIS — F3130 Bipolar disorder, current episode depressed, mild or moderate severity, unspecified: Secondary | ICD-10-CM

## 2014-04-08 MED ORDER — ZOLPIDEM TARTRATE 10 MG PO TABS: 10.0000 mg | ORAL_TABLET | Freq: Every evening | ORAL | Status: DC | PRN

## 2014-04-08 NOTE — Tx Team (Signed)
Interdisciplinary Treatment Plan Update (Adult) Date: 04/08/2014   Time Reviewed: 9:30 AM  Progress in Treatment: Attending groups: Patient new to milieu, CSW continuing to assess Participating in groups: Patient new to milieu, CSW continuing to assess Taking medication as prescribed: Yes Tolerating medication: Yes Family/Significant other contact made: CSW continuing to assess Patient understands diagnosis: Yes Discussing patient identified problems/goals with staff: Yes Medical problems stabilized or resolved: Yes Denies suicidal/homicidal ideation: CSW continuing to assess Issues/concerns per patient self-inventory: Yes Other:  New problem(s) identified: N/A  Discharge Plan or Barriers: CSW continuing to assess.  Reason for Continuation of Hospitalization:  Depression Anxiety Medication Stabilization   Comments: N/A  Estimated length of stay: 3-5 days  For review of initial/current patient goals, please see plan of care.  Attendees: Patient:    Family:    Physician: Dr. Jama Flavors; Dr. Dub Mikes 04/01/2014 9:30 AM  Nursing: Neill Loft, Arville Lime., RN 04/01/2014 9:30 AM  Clinical Social Worker: Samuella Bruin,  LCSWA 04/01/2014 9:30 AM  Other: Juline Patch, LCSW 04/01/2014 9:30 AM  Other: Leisa Lenz, Vesta Mixer Liaison 04/01/2014 9:30 AM  Other: Onnie Boer, Case Manager 04/01/2014 9:30 AM  Other:    Other:    Other:    Other:    Other:    Other:        Scribe for Treatment Team:  Samuella Bruin, MSW, [employer] [phone]
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
## 2014-04-08 NOTE — [facility] Suicide Risk Assessment (Signed)
[facility] INPATIENT:  Family/Significant Other Suicide Prevention Education  Suicide Prevention Education:  Education Completed; Morgan [name] (pt's boyfriend) [phone] has been identified by the patient as the family member/significant other with whom the patient will be residing, and identified as the person(s) who will aid the patient in the event of a mental health crisis (suicidal ideations/suicide attempt).  With written consent from the patient, the family member/significant other has been provided the following suicide prevention education, prior to the and/or following the discharge of the patient.  The suicide prevention education provided includes the following:  Suicide risk factors  Suicide prevention and interventions  National Suicide Hotline telephone number  [facility] assessment telephone number  [facility] Emergency Assistance 911  [facility] and/or Residential Mobile Crisis Unit telephone number  Request made of family/significant other to:  Remove weapons (e.g., guns, rifles, knives), all items previously/currently identified as safety concern.    Remove drugs/medications (over-the-counter, prescriptions, illicit drugs), all items previously/currently identified as a safety concern.  The family member/significant other verbalizes understanding of the suicide prevention education information provided.  The family member/significant other agrees to remove the items of safety concern listed above.  Smart, Cephus Tupy LCSWA 04/08/2014, 3:43 PM

## 2014-04-08 NOTE — Progress Notes (Signed)
The focus of this group is to educate the patient on the purpose and policies of crisis stabilization and provide a format to answer questions about their admission.  The group details unit policies and expectations of patients while admitted. Patient attended group and was an active participant. She says she plans to do 10 minutes of meditation today ab\n time she feels frustrated.
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
## 2014-04-08 NOTE — Progress Notes (Signed)
D Pt. Denies SI and HI, pt continues to report high anxiety and depression.  No complaints of pain or discomfort noted.  A Writer offered support and encouragement,    R  Pt. Remains safe on the unit,  Rates her depression a 7 and her anxiety a 3.  Pt. States her medication was no longer working and she is hopeful the medication changes will decrease her depression and anxiety.

## 2014-04-08 NOTE — Progress Notes (Signed)
Brief support with pt in hallway of 300.  Pt requests consult with chaplain.  Pt wished to attend pet therapy.  Chaplain will follow up with pt this evening or tomorrow morning.   Belva Crome MDiv

## 2014-04-08 NOTE — Progress Notes (Signed)
Pt was observed early in the shift lying in bed awake when writer went to introduce self.  Pt reports she feels the same as she did on admission and hopes that the MD can get her meds adjusted to help her.  Pt says she is still feeling anxious and is hopeful for a restful night.  Pt is able to contract for safety.  She denies HI/AV at this time.  Pt was encouraged to make her needs known to staff.  Pt voiced understanding.  Support and encouragement offered.  Safety maintained with q15 minute checks.

## 2014-04-08 NOTE — Progress Notes (Signed)
Patient ID: Morgan [name], female   DOB: 06/10/1991, 23 y.o.   MRN: [id number] D-Patient reports her sleep was poor and her appetite is fair.  Her energy level is low and her concentration is poor.  She is rating depression at 9/10 and hopelessness at 9/10.  She rates anxiety at 2/10.  She is having some thoughts of self harm and she does contract for safety.  A- supported patient.  asked with her about continuing to use nicotine patch after discharge.  R- Patient says she would like to as she has been trying to quit.  Her goal today is to stay centered, She says she is feeling "Lots of guilt and shame"  Talking with MD about this.

## 2014-04-08 NOTE — Progress Notes (Addendum)
Recreation Therapy Notes   Animal-Assisted Activity/Therapy (AAA/T) Program Checklist/Progress Notes  Patient Eligibility Criteria Checklist & Daily Group note for Rec Tx Intervention  Date: 09.08.2015 Time: 2:45pm Location: [address]   AAA/T Program Assumption of Risk Form signed by Patient/ or Parent Legal Guardian Yes  Patient is free of allergies or sever asthma  Yes  Patient reports no fear of animals Yes  Patient reports no history of cruelty to animals Yes   Patient understands his/her participation is voluntary Yes  Patient washes hands before animal contact Yes  Patient washes hands after animal contact Yes  Behavioral Response: Engaged, Appropriate   Education: Communication, Charity fundraiser, Appropriate Animal Interaction   Education Outcome: Acknowledges education    Clinical Observations/Feedback:  Patient interacted appropriately with therapeutic dog team and peers during session, petting therapy dog appropriately and sharing stories about pets she has at home. Additionally patient asked appropriate questions about therapy dog and his training.   [name], LRT/CTRS  Morgan [name] 04/08/2014 4:03 PM

## 2014-04-09 DIAGNOSIS — F314 Bipolar disorder, current episode depressed, severe, without psychotic features: Secondary | ICD-10-CM | POA: Diagnosis present

## 2014-04-09 MED ORDER — ZIPRASIDONE HCL 20 MG PO CAPS
20.0000 mg | ORAL_CAPSULE | Freq: Two times a day (BID) | ORAL | Status: DC
  Administered 2014-04-09 – 2014-04-10 (×2): 20 mg via ORAL
  Filled 2014-04-09 (×6): qty 1

## 2014-04-09 MED ORDER — ZOLPIDEM TARTRATE 10 MG PO TABS
10.0000 mg | ORAL_TABLET | Freq: Every evening | ORAL | Status: DC | PRN
  Administered 2014-04-09 – 2014-04-14 (×6): 10 mg via ORAL
  Filled 2014-04-09 (×6): qty 1

## 2014-04-09 MED ORDER — CITALOPRAM HYDROBROMIDE 40 MG PO TABS
40.0000 mg | ORAL_TABLET | Freq: Every day | ORAL | Status: DC
  Administered 2014-04-10 – 2014-04-15 (×6): 40 mg via ORAL
  Filled 2014-04-09 (×4): qty 1
  Filled 2014-04-09: qty 14
  Filled 2014-04-09 (×3): qty 1

## 2014-04-09 NOTE — Progress Notes (Signed)
D: Patient cooperative with staff. Patient presents with appropriate affect and depressed mood. She reported on the self inventory sheet that sleep and ability to concentrate are poor, appetite is good and energy level is normal. Patient rates depression "9", feelings of hopelessness "10" and anxiety "3". Her goal today is to work on keeping emotions, feelings and attitude in a leveled place. Compliant with current medication regimen.  A: Support and encouragement provided to patient. Scheduled medications administered per MD orders. Maintain Q15 minute checks for safety.  R: Patient receptive. Endorses passive SI, but contracts for safety. Denies HI and auditory/visual hallucinations. Patient remains safe.
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
## 2014-04-09 NOTE — [facility] Group Notes (Signed)
   [facility] LCSW Aftercare Discharge Planning Group Note  04/09/2014  8:45 AM   Participation Quality: Alert, Appropriate and Oriented  Mood/Affect: Depressed and Flat  Depression Rating: 9  Anxiety Rating: 2  Thoughts of Suicide: Pt denies SI/HI  Will you contract for safety? Yes  Current AVH: Pt denies  Plan for Discharge/Comments: Pt attended discharge planning group and actively participated in group. CSW provided pt with today's workbook. Patient reports that she did not sleep well last night and is feeling very depressed today. She is interested in talking to the physician regarding having her medications adjusted. She denies SI/HI. Patient is agreeable to follow up with [facility] at discharge.  Transportation Means: Pt reports access to transportation  Supports: Patient's boyfriend has been identified as a strong support.  Samuella Bruin, MSW, [employer] Clinical Social Worker [facility] [phone]

## 2014-04-09 NOTE — Progress Notes (Signed)
Pt was in the bed dozing at the beginning of the unit and then she got up after group time was over.  She says the MD started her back on Geodon today and it has made her drowsy.  She also c/o some shortness of breath, but at the time of writer's conversation with patient she was not having any difficulty with her breathing.  Pt reports she is still having some passive thoughts of self harm, but contracts for safety.  Pt has attended some groups today.  She is still anxious about her dx, but hopes the MD can find the right combination of meds for her to be able to function outside of the hospital.  Pt plans to return home at discharge.  Pt is pleasant/cooperative with staff.  Pt makes her needs known to staff.  Support and encouragement offered.  Safety maintained with q15 minute checks.

## 2014-04-09 NOTE — [facility] Group Notes (Addendum)
[facility] LCSW Group Therapy  04/09/2014   1:15 PM   Type of Therapy:  Group Therapy  Participation Level:  Active  Participation Quality:  Attentive, Sharing and Supportive  Affect:  Depressed and Flat  Cognitive:  Alert and Oriented  Insight:  Developing/Improving and Engaged  Engagement in Therapy:  Developing/Improving and Engaged  Modes of Intervention:  Clarification, Confrontation, Discussion, Education, Exploration, Limit-setting, Orientation, Problem-solving, Rapport Building, Dance movement psychotherapist, Socialization and Support  Summary of Progress/Problems: The topic for group therapy was feelings about diagnosis.  Pt actively participated in group discussion on their past and current diagnosis and how they feel towards this.  Pt also identified how society and family members judge them, based on their diagnosis as well as stereotypes and stigmas.  Patient discussed that she was scared when initially diagnosed as having bipolar disorder because she was afraid that she would be like her mother who also suffers from bipolar disorder. Patient was able to process that she plans to manage her symptoms better than her mother by taking medications and receiving therapy services. CSW's provided patient with emotional support and encouragement.  Samuella Bruin, MSW, [employer] Clinical Social Worker [facility] [phone]

## 2014-04-09 NOTE — [facility] Group Notes (Signed)
(  LATE ENTRY)             [facility] LCSW Group Therapy 04/08/14  1:15 pm  Type of Therapy: Group Therapy Participation Level: Active  Participation Quality: Attentive, Sharing and Supportive  Affect: Depressed and Flat  Cognitive: Alert and Oriented  Insight: Developing/Improving and Engaged  Engagement in Therapy: Developing/Improving and Engaged  Modes of Intervention: Clarification, Confrontation, Discussion, Education, Exploration,  Limit-setting, Orientation, Problem-solving, Rapport Building, Dance movement psychotherapist, Socialization and Support  Summary of Progress/Problems: Pt identified obstacles faced currently and processed barriers involved in overcoming these obstacles. Pt identified steps necessary for overcoming these obstacles and explored motivation (internal and external) for facing these difficulties head on. Pt further identified one area of concern in their lives and chose a goal to focus on for today. Patient shared that she would like to overcome feelings of guilt and regret and low self-esteem by attending regular therapy and not isolating herself. CSW's provided emotional support and encouragement.  Samuella Bruin, MSW, [employer] Clinical Social Worker [facility] [phone]

## 2014-04-10 DIAGNOSIS — F988 Other specified behavioral and emotional disorders with onset usually occurring in childhood and adolescence: Secondary | ICD-10-CM

## 2014-04-10 MED ORDER — PRAMIPEXOLE DIHYDROCHLORIDE 0.25 MG PO TABS
0.5000 mg | ORAL_TABLET | Freq: Every day | ORAL | Status: DC
  Administered 2014-04-10 – 2014-04-14 (×5): 0.5 mg via ORAL
  Filled 2014-04-10 (×6): qty 2
  Filled 2014-04-10: qty 14

## 2014-04-10 MED ORDER — GABAPENTIN 100 MG PO CAPS
100.0000 mg | ORAL_CAPSULE | Freq: Three times a day (TID) | ORAL | Status: DC
  Administered 2014-04-10 – 2014-04-13 (×8): 100 mg via ORAL
  Filled 2014-04-10 (×11): qty 1

## 2014-04-10 NOTE — Progress Notes (Signed)
D: Patient's affect appropriate to circumstance and mood is depressed. She reported on the self inventory sheet that sleep, appetite and ability to concentrate are all good and energy level is low. Patient rates depression/feelings of hopelessness "7" and anxiety "3". No interaction with peers on the hall, but visible in the milieu. Patient adheres to all medications.  A: Support and encouragement provided to patient. Administered scheduled medications per ordering MD. Monitor Q15 minute checks for safety.  R: Patient receptive. Continues to endorse passive SI, but contracts for safety. Denies HI and AVH. Patient remains safe on the unit.

## 2014-04-10 NOTE — Plan of Care (Signed)
Problem: Diagnosis: Increased Risk For Suicide Attempt Goal: LTG-Patient Will Report Absence of Withdrawal Symptoms LTG (by discharge): Patient will report absence of withdrawal symptoms.  Outcome: Not Applicable Date Met:  34/91/79 Pt reports she has a hx of substance abuse, but has not used in about a month.

## 2014-04-10 NOTE — [facility] Group Notes (Signed)
[facility] Group Notes:  (Nursing/MHT/Case Management/Adjunct)  Date:  04/10/2014  Time:  10:21 AM  Type of Therapy:  Nurse Education  Participation Level:  Active  Participation Quality:  Appropriate  Affect:  Appropriate  Cognitive:  Alert and Appropriate  Insight:  Appropriate  Engagement in Group:  Engaged  Modes of Intervention:  Problem-solving and Support  Summary of Progress/Problems: Morning wellness group  Andres Ege 04/10/2014, 10:21 AM

## 2014-04-10 NOTE — Progress Notes (Signed)
D   Pt is pleasant on approach and smiles often   She continues to endorse depression and worry   She spent most of the day in bed and told staff that she thinks it is the medication that is making her so sleepy    She reports pasive suicidal ideation and contracts for safety A   Verbal support given   Medications administered and effectiveness monitored    Q 15 min checks R   Pt safe at present

## 2014-04-10 NOTE — Progress Notes (Signed)
Adult Psychoeducational Group Note  Date:  04/10/2014 Time:  9:12 PM  Group Topic/Focus:  Wrap-Up Group:   The focus of this group is to help patients review their daily goal of treatment and discuss progress on daily workbooks.  Participation Level:  Active  Participation Quality:  Appropriate and Attentive  Affect:  Appropriate  Cognitive:  Alert and Appropriate  Insight: Appropriate and Good  Engagement in Group:  Engaged  Modes of Intervention:  Support  Additional Comments:  Pt was engaged with other during the wrap up group   Rupa Lagan R 04/10/2014, 9:12 PM

## 2014-04-10 NOTE — Progress Notes (Signed)
[facility] MD Progress Note  04/10/2014 5:21 PM Morgan [name]  MRN:  [id number] Subjective:  Morgan [name] was not able to tolerate the Geodon. States she had the sedation and shortness of breath. States the shortness of breath  happened with the Risperdal too. She is still having some suicidal ideas. She is worried.  Diagnosis:   DSM5: Trauma-Stressor Disorders:  Posttraumatic Stress Disorder (309.81) Depressive Disorders:  Major Depressive Disorder - Severe (296.23) Total Time spent with patient: 30 minutes  Axis I: ADHD, combined type and Bipolar, Depressed  ADL's:  Intact  Sleep: Fair  Appetite:  Fair Psychiatric Specialty Exam: Physical Exam  Review of Systems  Constitutional: Positive for malaise/fatigue.  HENT: Negative.   Eyes: Negative.   Respiratory: Positive for shortness of breath.   Cardiovascular: Negative.   Gastrointestinal: Negative.   Genitourinary: Negative.   Musculoskeletal: Negative.   Skin: Negative.   Neurological: Positive for dizziness and weakness.  Endo/Heme/Allergies: Negative.   Psychiatric/Behavioral: Positive for depression and suicidal ideas. The patient is nervous/anxious.     Blood pressure 108/61, pulse 98, temperature 97.8 F (36.6 C), temperature source Oral, resp. rate 20, height  (1.651 m), weight 94.802 kg (209 lb), last menstrual period 03/14/2014, SpO2 100.00%.Body mass index is 34.78 kg/(m^2).  General Appearance: Fairly Groomed  Patent attorney::  Fair  Speech:  Clear and Coherent  Volume:  Decreased  Mood:  Anxious and Depressed  Affect:  Restricted  Thought Process:  Coherent and Goal Directed  Orientation:  Full (Time, Place, and Person)  Thought Content:  symptoms worries concerns  Suicidal Thoughts:  Yes, no plan or intent  Homicidal Thoughts:  No  Memory:  Immediate;   Fair Recent;   Fair Remote;   Fair  Judgement:  Fair  Insight:  Present  Psychomotor Activity:  Restlessness  Concentration:  Fair  Recall:  [employer] of  Knowledge:NA  Language: Fair  Akathisia:  No  Handed:    AIMS (if indicated):     Assets:  Desire for Improvement  Sleep:  Number of Hours: 6.76   Musculoskeletal: Strength & Muscle Tone: within normal limits Gait & Station: normal Patient leans: N/A  Current Medications: Current Facility-Administered Medications  Medication Dose Route Frequency Provider Last Rate Last Dose  . acetaminophen (TYLENOL) tablet 650 mg  650 mg Oral Q6H PRN Nanine Means, NP      . albuterol (PROVENTIL HFA;VENTOLIN HFA) 108 (90 BASE) MCG/ACT inhaler 2 puff  2 puff Inhalation Q4H PRN Nanine Means, NP      . alum & mag hydroxide-simeth (MAALOX/MYLANTA) 200-200-20 MG/5ML suspension 30 mL  30 mL Oral Q4H PRN Nanine Means, NP      . citalopram (CELEXA) tablet 40 mg  40 mg Oral Daily Rachael Fee, MD   40 mg at 04/10/14 0801  . diphenhydrAMINE (BENADRYL) capsule 25 mg  25 mg Oral Q6H PRN Nanine Means, NP      . gabapentin (NEURONTIN) capsule 100 mg  100 mg Oral TID Rachael Fee, MD   100 mg at 04/10/14 1708  . loratadine (CLARITIN) tablet 10 mg  10 mg Oral Daily Nanine Means, NP   10 mg at 04/10/14 0801  . LORazepam (ATIVAN) tablet 1 mg  1 mg Oral Q6H PRN Nanine Means, NP   1 mg at 04/08/14 2140  . magnesium hydroxide (MILK OF MAGNESIA) suspension 30 mL  30 mL Oral Daily PRN Nanine Means, NP      . montelukast (SINGULAIR) tablet 10  mg  10 mg Oral Daily Nanine Means, NP   10 mg at 04/10/14 0801  . nicotine (NICODERM CQ - dosed in mg/24 hours) patch 21 mg  21 mg Transdermal Daily Nehemiah Massed, MD   21 mg at 04/10/14 0801  . Oxcarbazepine (TRILEPTAL) tablet 300 mg  300 mg Oral BID Nanine Means, NP   300 mg at 04/10/14 1708  . pramipexole (MIRAPEX) tablet 0.5 mg  0.5 mg Oral QHS Rachael Fee, MD      . prazosin (MINIPRESS) capsule 2 mg  2 mg Oral QHS Nanine Means, NP   2 mg at 04/09/14 2143  . zolpidem (AMBIEN) tablet 10 mg  10 mg Oral QHS PRN Rachael Fee, MD   10 mg at 04/09/14 2145    Lab Results: No results  found for this or any previous visit (from the past 48 hour(s)).  Physical Findings: AIMS: Facial and Oral Movements Muscles of Facial Expression: None, normal Lips and Perioral Area: None, normal Jaw: None, normal Tongue: None, normal,Extremity Movements Upper (arms, wrists, hands, fingers): None, normal Lower (legs, knees, ankles, toes): None, normal, Trunk Movements Neck, shoulders, hips: None, normal, Overall Severity Severity of abnormal movements (highest score from questions above): None, normal Incapacitation due to abnormal movements: None, normal Patient's awareness of abnormal movements (rate only patient's report): No Awareness, Dental Status Current problems with teeth and/or dentures?: No Does patient usually wear dentures?: No  CIWA:    COWS:     Treatment Plan Summary: Daily contact with patient to assess and evaluate symptoms and progress in treatment Medication management  Plan: Supportive approach/coping skills           CBT;mindfulness           Asked to be placed on Neurontin for her fibromyalgia: Neurontin 100 mg TID           Will D/C the Geodon, will try a conventional   Medical Decision Making Problem Points:  Established problem, worsening (2) and Review of psycho-social stressors (1) Data Points:  Review of medication regiment & side effects (2) Review of new medications or change in dosage (2)  I certify that inpatient services furnished can reasonably be expected to improve the patient's condition.   Kesleigh Morson A 04/10/2014, 5:21 PM

## 2014-04-10 NOTE — Plan of Care (Signed)
Problem: Alteration in mood Goal: LTG-Patient reports reduction in suicidal thoughts (Patient reports reduction in suicidal thoughts and is able to verbalize a safety plan for whenever patient is feeling suicidal)  Outcome: Not Progressing Pt still endorsing passive suicidal thoughts.  Pt hopeful that new medications will help these thoughts to decrease.

## 2014-04-10 NOTE — [facility] Group Notes (Signed)
[facility] LCSW Group Therapy 04/10/2014  1:15 PM   Type of Therapy: Group Therapy  Participation Level: Did Not Attend.   Samuella Bruin, MSW, [employer] Clinical Social Worker [facility] [phone]

## 2014-04-11 MED ORDER — CHLORPROMAZINE HCL 10 MG PO TABS: 10.0000 mg | ORAL_TABLET | Freq: Three times a day (TID) | ORAL | Status: DC

## 2014-04-11 MED ORDER — CHLORPROMAZINE HCL 50 MG PO TABS
50.0000 mg | ORAL_TABLET | Freq: Every day | ORAL | Status: DC
  Administered 2014-04-11 – 2014-04-14 (×4): 50 mg via ORAL
  Filled 2014-04-11: qty 14
  Filled 2014-04-11 (×5): qty 1

## 2014-04-11 MED ORDER — CHLORPROMAZINE HCL 10 MG PO TABS
10.0000 mg | ORAL_TABLET | Freq: Two times a day (BID) | ORAL | Status: DC
  Administered 2014-04-11 – 2014-04-15 (×8): 10 mg via ORAL
  Filled 2014-04-11 (×12): qty 1

## 2014-04-11 NOTE — Progress Notes (Signed)
Patient ID: Morgan [name], female   DOB: 10/27/1990, 23 y.o.   MRN: [id number] D: Patient in room on approach. Pt mood and affect appeared depressed and flat. Pt report her goal today was to engage more in group activities. Pt reports decrease in anxiety and depressive symptoms. Pt denies SI/HI/AVH and pain. Cooperative with assessment. No acute distressed noted at this time.   A: Met with pt 1:1. Medications administered as prescribed. Writer encouraged pt to discuss feelings. Pt encouraged to come to staff with any question or concerns.   R: Patient remains safe. She is complaint with medications and denies any adverse reaction. Continue current POC.

## 2014-04-11 NOTE — [facility] Group Notes (Signed)
   [facility] LCSW Aftercare Discharge Planning Group Note  04/11/2014  8:45 AM   Participation Quality: Alert, Appropriate and Oriented  Mood/Affect: Depressed and Flat  Depression Rating: 6/7  Anxiety Rating: 7  Thoughts of Suicide: Pt endorses passive SI, denies HI  Will you contract for safety? Yes  Current AVH: Pt denies  Plan for Discharge/Comments: Pt attended discharge planning group and actively participated in group. CSW provided pt with today's workbook. Patient reports that she feels "pretty good but tired" today. She feels that her medication adjustment and groups have been helpful. She is agreeable to follow up with [facility] at discharge. Patient states that she can return to her current living situation at discharge.  Transportation Means: Pt reports access to transportation  Supports: Patient reports that boyfriend is a support for her.   Samuella Bruin, MSW, [employer] Clinical Social Worker [facility] [phone]

## 2014-04-11 NOTE — [facility] Group Notes (Signed)
Adult Psychoeducational Group Note  Date:  04/11/2014 Time:  9:57 PM  Group Topic/Focus:  AA Meeting  Participation Level:  Active  Participation Quality:  Appropriate  Affect:  Flat  Cognitive:  Appropriate  Insight: Good  Engagement in Group:  Engaged  Modes of Intervention:  Discussion and Education  Additional Comments:  Emi attended [employer].  Caroll Rancher A 04/11/2014, 9:57 PM

## 2014-04-11 NOTE — Tx Team (Signed)
Interdisciplinary Treatment Plan Update (Adult) Date: 04/11/2014   Time Reviewed: 9:30 AM  Progress in Treatment: Attending groups: Yes Participating in groups: Yes Taking medication as prescribed: Yes Tolerating medication: Yes Family/Significant other contact made: Yes Patient understands diagnosis: Yes Discussing patient identified problems/goals with staff: Yes Medical problems stabilized or resolved: Yes Denies suicidal/homicidal ideation: Patient endorses passive SI but can contract for safety Issues/concerns per patient self-inventory: Yes Other:  New problem(s) identified: N/A  Discharge Plan or Barriers: Patient agreeable to follow up with [facility] and [facility] for outpatient services.  Reason for Continuation of Hospitalization:  Depression Anxiety Medication Stabilization   Comments: N/A  Estimated length of stay: 3-5 days  For review of initial/current patient goals, please see plan of care.  Patient and CSW reviewed pt's identified goals and treatment plan. Patient's goals are: mood stabilization with medication management; establishing aftercare with [facility]; and gaining coping skills through group attendance and participation. Pt verbalized understanding and agreed to treatment plan. Patient's goals reviewed with treatment team.   Attendees: Patient:    Family:    Physician: Dr. Jama Flavors; Dr. Dub Mikes 04/01/2014 9:30 AM  Nursing: Astrid Drafts D., RN 04/01/2014 9:30 AM  Clinical Social Worker: Samuella Bruin,  LCSWA 04/01/2014 9:30 AM  Other: Juline Patch, LCSW 04/01/2014 9:30 AM  Other: Leisa Lenz, Vesta [employer]er Liaison 04/01/2014 9:30 AM     Other: Serena Colonel, NP 04/01/2014 9:30 AM  Other:    Other:    Other:    Other:    Other:        Scribe for Treatment Team:  Samuella Bruin, MSW, [employer] [phone]

## 2014-04-11 NOTE — Progress Notes (Signed)
D: Patient continues to have appropriate affect and depressed mood. She reported on the self inventory sheet that sleep and ability to concentrate are both poor, normal energy level and good appetite. Patient rates depression, feelings of hopelessness and anxiety "7". She's attending groups throughout the day and interactive with peers on the hall.  A: Support and encouragement provided to patient. Scheduled medications administered per MD orders. Maintain Q15 minute checks for safety.  R: Patient receptive. Denies SI/HI. Patient remains safe.

## 2014-04-11 NOTE — Progress Notes (Signed)
[facility] MD Progress Note  04/11/2014 4:26 PM Morgan [name]  MRN:  [id number] Subjective:  States she did not rest well last night. States she still has a lot of racing thoughts when she goes to bed. Would like to be able to quiet her brain down. Still concern about the "bad thoughts" that keep coming back. She is also concerned about her mood instability. As she could not take the Geodon she would still like to have medication that could help with the racing thoughts, the suicidal ideas when they come Diagnosis:   DSM5: Trauma-Stressor Disorders:  Posttraumatic Stress Disorder (309.81) Depressive Disorders:  Major Depressive Disorder - Severe (296.23) Total Time spent with patient: 30 minutes  Axis I: Bipolar, Depressed  ADL's:  Intact  Sleep: Poor  Appetite:  Fair  Psychiatric Specialty Exam: Physical Exam  Review of Systems  Constitutional: Positive for malaise/fatigue.  HENT: Negative.   Eyes: Negative.   Respiratory: Negative.   Cardiovascular: Negative.   Gastrointestinal: Negative.   Genitourinary: Negative.   Musculoskeletal: Negative.   Skin: Negative.   Neurological: Negative.   Psychiatric/Behavioral: Positive for depression. The patient is nervous/anxious and has insomnia.     Blood pressure 128/71, pulse 88, temperature 97.8 F (36.6 C), temperature source Oral, resp. rate 20, height  (1.651 m), weight 94.802 kg (209 lb), last menstrual period 03/14/2014, SpO2 100.00%.Body mass index is 34.78 kg/(m^2).  General Appearance: Fairly Groomed  Patent attorney::  Fair  Speech:  Clear and Coherent  Volume:  Decreased  Mood:  Anxious and Depressed  Affect:  Restricted  Thought Process:  Coherent and Goal Directed  Orientation:  Full (Time, Place, and Person)  Thought Content:  symptoms worries concers  Suicidal Thoughts:  Intermittent  Homicidal Thoughts:  No  Memory:  Immediate;   Fair Recent;   Fair Remote;   Fair  Judgement:  Fair  Insight:  Present   Psychomotor Activity:  Restlessness  Concentration:  Fair  Recall:  [employer] of Knowledge:NA  Language: Fair  Akathisia:  No  Handed:    AIMS (if indicated):     Assets:  Desire for Improvement Housing Social Support  Sleep:  Number of Hours: 6   Musculoskeletal: Strength & Muscle Tone: within normal limits Gait & Station: normal Patient leans: N/A  Current Medications: Current Facility-Administered Medications  Medication Dose Route Frequency Provider Last Rate Last Dose  . acetaminophen (TYLENOL) tablet 650 mg  650 mg Oral Q6H PRN Nanine Means, NP   650 mg at 04/11/14 1116  . albuterol (PROVENTIL HFA;VENTOLIN HFA) 108 (90 BASE) MCG/ACT inhaler 2 puff  2 puff Inhalation Q4H PRN Nanine Means, NP      . alum & mag hydroxide-simeth (MAALOX/MYLANTA) 200-200-20 MG/5ML suspension 30 mL  30 mL Oral Q4H PRN Nanine Means, NP      . chlorproMAZINE (THORAZINE) tablet 10 mg  10 mg Oral BID Rachael Fee, MD      . chlorproMAZINE (THORAZINE) tablet 50 mg  50 mg Oral QHS Rachael Fee, MD      . citalopram (CELEXA) tablet 40 mg  40 mg Oral Daily Rachael Fee, MD   40 mg at 04/11/14 0820  . diphenhydrAMINE (BENADRYL) capsule 25 mg  25 mg Oral Q6H PRN Nanine Means, NP      . gabapentin (NEURONTIN) capsule 100 mg  100 mg Oral TID Rachael Fee, MD   100 mg at 04/11/14 1202  . loratadine (CLARITIN) tablet 10 mg  10 mg Oral Daily Nanine Means, NP   10 mg at 04/11/14 0820  . LORazepam (ATIVAN) tablet 1 mg  1 mg Oral Q6H PRN Nanine Means, NP   1 mg at 04/08/14 2140  . magnesium hydroxide (MILK OF MAGNESIA) suspension 30 mL  30 mL Oral Daily PRN Nanine Means, NP      . montelukast (SINGULAIR) tablet 10 mg  10 mg Oral Daily Nanine Means, NP   10 mg at 04/11/14 0820  . nicotine (NICODERM CQ - dosed in mg/24 hours) patch 21 mg  21 mg Transdermal Daily Nehemiah Massed, MD   21 mg at 04/11/14 2956  . Oxcarbazepine (TRILEPTAL) tablet 300 mg  300 mg Oral BID Nanine Means, NP   300 mg at 04/11/14 0820  .  pramipexole (MIRAPEX) tablet 0.5 mg  0.5 mg Oral QHS Rachael Fee, MD   0.5 mg at 04/10/14 2206  . prazosin (MINIPRESS) capsule 2 mg  2 mg Oral QHS Nanine Means, NP   2 mg at 04/10/14 2206  . zolpidem (AMBIEN) tablet 10 mg  10 mg Oral QHS PRN Rachael Fee, MD   10 mg at 04/10/14 2209    Lab Results: No results found for this or any previous visit (from the past 48 hour(s)).  Physical Findings: AIMS: Facial and Oral Movements Muscles of Facial Expression: None, normal Lips and Perioral Area: None, normal Jaw: None, normal Tongue: None, normal,Extremity Movements Upper (arms, wrists, hands, fingers): None, normal Lower (legs, knees, ankles, toes): None, normal, Trunk Movements Neck, shoulders, hips: None, normal, Overall Severity Severity of abnormal movements (highest score from questions above): None, normal Incapacitation due to abnormal movements: None, normal Patient's awareness of abnormal movements (rate only patient's report): No Awareness, Dental Status Current problems with teeth and/or dentures?: No Does patient usually wear dentures?: No  CIWA:    COWS:     Treatment Plan Summary: Daily contact with patient to assess and evaluate symptoms and progress in treatment Medication management  Plan: Supportive approach/coping skills           CBT;mindfulness           Trial with Thorazine 10 mg BID, 50 mg HS           The increase Mirapex help with the restless leg syndrome will pursue  Medical Decision Making Problem Points:  Review of psycho-social stressors (1) Data Points:  Review of medication regiment & side effects (2) Review of new medications or change in dosage (2)  I certify that inpatient services furnished can reasonably be expected to improve the patient's condition.   Jervis Trapani A 04/11/2014, 4:26 PM

## 2014-04-12 NOTE — [facility] Group Notes (Signed)
(  LATE ENTRY FROM 04/11/14)  [facility] LCSW Group Therapy 04/11/14 1:15 PM Type of Therapy: Group Therapy Participation Level: Active  Participation Quality: Attentive, Sharing and Supportive  Affect: Depressed and Flat  Cognitive: Alert and Oriented  Insight: Developing/Improving and Engaged  Engagement in Therapy: Developing/Improving and Engaged  Modes of Intervention: Clarification, Confrontation, Discussion, Education, Exploration, Limit-setting, Orientation, Problem-solving, Rapport Building, Dance movement psychotherapist, Socialization and Support  Summary of Progress/Problems: The topic for today was feelings about relapse. Pt discussed what relapse prevention is to them and identified triggers that they are on the path to relapse. Pt processed their feeling towards relapse and was able to relate to peers. Pt discussed coping skills that can be used for relapse prevention. Patient identified her goals as: not escaping things that make her uncomfortable, not isolating, decrease over-analyzing herself, and finding her sense of identity. CSW's and other group members provided patient with emotional support and encouragement.   Samuella Bruin, MSW, [employer] Clinical Social Worker [facility] [phone]

## 2014-04-12 NOTE — Progress Notes (Signed)
.  Psychoeducational Group Note    Date: 04/12/2014 Time:  0930   Goal Setting Purpose of Group: To be able to set [name] goal that is measurable and that can be accomplished in one day Participation Level:  Did not attend  Morgan [name]  

## 2014-04-12 NOTE — Progress Notes (Signed)
Patient ID: Morgan [name], female   DOB: 12-04-1990, 23 y.o.   MRN: [id number] [facility] MD Progress Note  04/12/2014 3:31 PM ELLIETT GUARISCO  MRN:  [id number] Subjective:  Patient states "I am feeling depressed. I feel bad about all that spending I did when I was manic. I could just not stop shopping. I am having less suicidal thoughts. Sometimes I think I should just end it. I can't stop beating myself up. I'm worried because several medications did not work for me. I want my life back. I am feeling sleepy from the medication right now."   Objective:  Patient is seen and chart is reviewed. She is assessed in her room today. Patient is feeling discouraged after being unable to tolerate Geodon. Yesterday she reports being started on Thorazine to help stabilize her mood. Alira reports feeling very sleepy during her assessment today. She feels this is related to her recent medication changes. She apologizes for not being able to attend groups this morning. Patient appears to be invested in her treatment but verbalizes frustration over her perceived lack of progress. Admits to strong feelings of depression today, which were rated at six. Has a great deal of remorse over activities that occurred during a manic episode.   Diagnosis:   DSM5: Trauma-Stressor Disorders:  Posttraumatic Stress Disorder (309.81) Depressive Disorders:  Major Depressive Disorder - Severe (296.23) Total Time spent with patient: 20 minutes  Axis I: Bipolar, Depressed  ADL's:  Intact  Sleep: Fair  Appetite:  Fair  Psychiatric Specialty Exam: Physical Exam  Review of Systems  Constitutional: Positive for malaise/fatigue.  HENT: Negative.   Eyes: Negative.   Respiratory: Negative.   Cardiovascular: Negative.   Gastrointestinal: Negative.   Genitourinary: Negative.   Musculoskeletal: Negative.   Skin: Negative.   Neurological: Negative.   Psychiatric/Behavioral: Positive for depression. The patient is nervous/anxious and  has insomnia.     Blood pressure 121/50, pulse 101, temperature 97.9 F (36.6 C), temperature source Oral, resp. rate 18, height  (1.651 m), weight 94.802 kg (209 lb), last menstrual period 03/14/2014, SpO2 100.00%.Body mass index is 34.78 kg/(m^2).  General Appearance: Fairly Groomed  Patent attorney::  Fair  Speech:  Clear and Coherent  Volume:  Decreased  Mood:  Anxious and Depressed  Affect:  Restricted  Thought Process:  Coherent and Goal Directed  Orientation:  Full (Time, Place, and Person)  Thought Content:  symptoms, worries, concerns   Suicidal Thoughts:  Passive SI with plan-contracts for safety  Homicidal Thoughts:  No  Memory:  Immediate;   Fair Recent;   Fair Remote;   Fair  Judgement:  Fair  Insight:  Present  Psychomotor Activity:  Normal  Concentration:  Fair  Recall:  [employer] of Knowledge:NA  Language: Fair  Akathisia:  No  Handed:    AIMS (if indicated):     Assets:  Desire for Improvement Housing Social Support  Sleep:  Number of Hours: 6.75   Musculoskeletal: Strength & Muscle Tone: within normal limits Gait & Station: normal Patient leans: N/A  Current Medications: Current Facility-Administered Medications  Medication Dose Route Frequency Provider Last Rate Last Dose  . acetaminophen (TYLENOL) tablet 650 mg  650 mg Oral Q6H PRN Nanine Means, NP   650 mg at 04/11/14 1116  . albuterol (PROVENTIL HFA;VENTOLIN HFA) 108 (90 BASE) MCG/ACT inhaler 2 puff  2 puff Inhalation Q4H PRN Nanine Means, NP      . alum & mag hydroxide-simeth (MAALOX/MYLANTA) 200-200-20 MG/5ML suspension  30 mL  30 mL Oral Q4H PRN Nanine Means, NP      . chlorproMAZINE (THORAZINE) tablet 10 mg  10 mg Oral BID Rachael Fee, MD   10 mg at 04/12/14 6045  . chlorproMAZINE (THORAZINE) tablet 50 mg  50 mg Oral QHS Rachael Fee, MD   50 mg at 04/11/14 2145  . citalopram (CELEXA) tablet 40 mg  40 mg Oral Daily Rachael Fee, MD   40 mg at 04/12/14 4098  . diphenhydrAMINE (BENADRYL)  capsule 25 mg  25 mg Oral Q6H PRN Nanine Means, NP      . gabapentin (NEURONTIN) capsule 100 mg  100 mg Oral TID Rachael Fee, MD   100 mg at 04/12/14 1319  . loratadine (CLARITIN) tablet 10 mg  10 mg Oral Daily Nanine Means, NP   10 mg at 04/12/14 1191  . LORazepam (ATIVAN) tablet 1 mg  1 mg Oral Q6H PRN Nanine Means, NP   1 mg at 04/08/14 2140  . magnesium hydroxide (MILK OF MAGNESIA) suspension 30 mL  30 mL Oral Daily PRN Nanine Means, NP      . montelukast (SINGULAIR) tablet 10 mg  10 mg Oral Daily Nanine Means, NP   10 mg at 04/12/14 4782  . nicotine (NICODERM CQ - dosed in mg/24 hours) patch 21 mg  21 mg Transdermal Daily Nehemiah Massed, MD   21 mg at 04/12/14 0823  . Oxcarbazepine (TRILEPTAL) tablet 300 mg  300 mg Oral BID Nanine Means, NP   300 mg at 04/12/14 9562  . pramipexole (MIRAPEX) tablet 0.5 mg  0.5 mg Oral QHS Rachael Fee, MD   0.5 mg at 04/11/14 2145  . prazosin (MINIPRESS) capsule 2 mg  2 mg Oral QHS Nanine Means, NP   2 mg at 04/11/14 2145  . zolpidem (AMBIEN) tablet 10 mg  10 mg Oral QHS PRN Rachael Fee, MD   10 mg at 04/11/14 2145    Lab Results: No results found for this or any previous visit (from the past 48 hour(s)).  Physical Findings: AIMS: Facial and Oral Movements Muscles of Facial Expression: None, normal Lips and Perioral Area: None, normal Jaw: None, normal Tongue: None, normal,Extremity Movements Upper (arms, wrists, hands, fingers): None, normal Lower (legs, knees, ankles, toes): None, normal, Trunk Movements Neck, shoulders, hips: None, normal, Overall Severity Severity of abnormal movements (highest score from questions above): None, normal Incapacitation due to abnormal movements: None, normal Patient's awareness of abnormal movements (rate only patient's report): No Awareness, Dental Status Current problems with teeth and/or dentures?: No Does patient usually wear dentures?: No  CIWA:    COWS:     Treatment Plan Summary: Daily contact with  patient to assess and evaluate symptoms and progress in treatment Medication management  Plan:   1. Continue crisis management and stabilization.  2. Medication management:  -Continue Thorazine 10 mg BID, 50 mg hs for mood control. -Continue Celexa 40 mg daily for depressive symptoms -Continue Neurontin 100 mg TID for anxiety -Continue Minipress 2 mg hs for PTSD symptoms -Continue Trileptal 300 mg BID for mood control.  3. Encouraged patient to attend groups and participate in group counseling sessions and activities.  4. Discharge plan in progress.  5. Continue current treatment plan.  6. Address health issues: Vitals reviewed and stable.   Medical Decision Making Problem Points:  Established problem, stable/improving (1) and Review of psycho-social stressors (1) Data Points:  Review of medication regiment & side effects (  2) Review of new medications or change in dosage (2)  I certify that inpatient services furnished can reasonably be expected to improve the patient's condition.   DAVIS, LAURA NP-C 04/12/2014, 3:31 PM I agreed with findings and treatment plan of this patient

## 2014-04-12 NOTE — Progress Notes (Signed)
Adult Psychoeducational Group Note  Date:  04/12/2014 Time:  10:02 PM  Group Topic/Focus:  Wrap-Up Group:   The focus of this group is to help patients review their daily goal of treatment and discuss progress on daily workbooks.  Participation Level:  Active  Participation Quality:  Appropriate  Affect:  Appropriate  Cognitive:  Appropriate  Insight: Appropriate  Engagement in Group:  Engaged  Modes of Intervention:  Discussion  Additional Comments:  The patient expressed that her day was fine until she talk to her parent.The patient said that the phone conversation was not good but she use coping skills to deal with the situation.  Octavio Manns 04/12/2014, 10:02 PM

## 2014-04-12 NOTE — Progress Notes (Signed)
D.  Pt pleasant on approach, denies complaints at this time other than some anxiety.  Denies SI/HI/hallucinations at this time.  Positive for evening wrap up group, interacting appropriately with peers on unit.  A.  Support and encouragement offered, medication given as ordered for anxiety.  R.  Pt remains safe on unit, will continue to monitor.

## 2014-04-12 NOTE — Progress Notes (Signed)
D) Pt has been quite sleepy today. Did attend the group this afternoon, but fell asleep in it. States the medications are "making me feel sleepy and it's hard for me to stay awake". Rates her depression at a 5 her hopelessness at a 7 and her anxiety at a 9. Denies SI and HI. Expressed shame for spending money that she did not have when she was manic. A) Given support, reassurance and praise. Encouraged to go to sleep in her bed rather than falling asleep in the dayroom. R) Denies SI and HI

## 2014-04-12 NOTE — [facility] Group Notes (Signed)
[facility] Group Notes: (Clinical Social Work)   04/12/2014      Type of Therapy:  Group Therapy   Participation Level:  Did Not Attend    Ambrose Mantle, LCSW 04/12/2014, 12:28 PM

## 2014-04-13 MED ORDER — GABAPENTIN 100 MG PO CAPS
200.0000 mg | ORAL_CAPSULE | Freq: Three times a day (TID) | ORAL | Status: DC
  Administered 2014-04-13 – 2014-04-15 (×6): 200 mg via ORAL
  Filled 2014-04-13 (×5): qty 2
  Filled 2014-04-13: qty 84
  Filled 2014-04-13 (×2): qty 2
  Filled 2014-04-13 (×2): qty 84
  Filled 2014-04-13 (×2): qty 2

## 2014-04-13 NOTE — Progress Notes (Signed)
Patient ID: Morgan [name], female   DOB: [birth year], 23 y.o.   MRN: [id number] [facility] MD Progress Note  04/13/2014 8:56 AM Morgan [name]  MRN:  [id number] Subjective:  Patient states " I am feeling pretty good. I think they the medications are working pretty good. I know the one that helps the most I believe is the Neurontin, I wonder if we can adjust it. Other than that I been pretty good. The drowsiness has worn off, I started walking and I was able to snap out of it. I missed one group yesterday because I was so tired." Currently rate depression at 3/10"the lowest it has been in a while", anxiety 5/10 "now that I took my morning meds it has gone down to about a five", and hopelessness 3/10. Denies SI/HI/AVH at this time.   Objective:  Patient is seen and chart is reviewed. She is observed in the dayroom interacting with her peers. She reports compliance with medication, and side effects have been minimized after several days of use. Morgan [name] reports feeling less drowsy during her assessment today.   Diagnosis:   DSM5: Trauma-Stressor Disorders:  Posttraumatic Stress Disorder (309.81) Depressive Disorders:  Major Depressive Disorder - Severe (296.23) Total Time spent with patient: 20 minutes  Axis I: Bipolar, Depressed  ADL's:  Intact  Sleep: Fair, took me while to go sleep but once I was sleep I slept pretty good.  Appetite:  Good  Psychiatric Specialty Exam: Physical Exam   Review of Systems  Constitutional: Positive for malaise/fatigue.  HENT: Negative.   Eyes: Negative.   Respiratory: Negative.   Cardiovascular: Negative.   Gastrointestinal: Negative.   Genitourinary: Negative.   Musculoskeletal: Negative.   Skin: Negative.   Neurological: Negative.   Psychiatric/Behavioral: Positive for depression. The patient is nervous/anxious and has insomnia.     Blood pressure 125/65, pulse 77, temperature 98.6 F (37 C), temperature source Oral, resp. rate 20, height  (1.651 m),  weight 94.802 kg (209 lb), last menstrual period 03/14/2014, SpO2 100.00%.Body mass index is 34.78 kg/(m^2).  General Appearance: Fairly Groomed  Patent attorney::  Fair  Speech:  Clear and Coherent  Volume:  Decreased  Mood:  Anxious and Depressed  Affect:  Depressed and Flat  Thought Process:  Coherent and Goal Directed  Orientation:  Full (Time, Place, and Person)  Thought Content:  symptoms, worries, concerns   Suicidal Thoughts:  No  Homicidal Thoughts:  No  Memory:  Immediate;   Fair Recent;   Fair Remote;   Fair  Judgement:  Fair  Insight:  Present  Psychomotor Activity:  Normal  Concentration:  Fair  Recall:  [employer] of Knowledge:NA  Language: Fair  Akathisia:  No  Handed:    AIMS (if indicated):     Assets:  Desire for Improvement Housing Social Support  Sleep:  Number of Hours: 6.75   Musculoskeletal: Strength & Muscle Tone: within normal limits Gait & Station: normal Patient leans: N/A  Current Medications: Current Facility-Administered Medications  Medication Dose Route Frequency Provider Last Rate Last Dose  . acetaminophen (TYLENOL) tablet 650 mg  650 mg Oral Q6H PRN Nanine Means, NP   650 mg at 04/11/14 1116  . albuterol (PROVENTIL HFA;VENTOLIN HFA) 108 (90 BASE) MCG/ACT inhaler 2 puff  2 puff Inhalation Q4H PRN Nanine Means, NP      . alum & mag hydroxide-simeth (MAALOX/MYLANTA) 200-200-20 MG/5ML suspension 30 mL  30 mL Oral Q4H PRN Nanine Means, NP      .  chlorproMAZINE (THORAZINE) tablet 10 mg  10 mg Oral BID Rachael Fee, MD   10 mg at 04/13/14 0749  . chlorproMAZINE (THORAZINE) tablet 50 mg  50 mg Oral QHS Rachael Fee, MD   50 mg at 04/12/14 2113  . citalopram (CELEXA) tablet 40 mg  40 mg Oral Daily Rachael Fee, MD   40 mg at 04/13/14 0749  . diphenhydrAMINE (BENADRYL) capsule 25 mg  25 mg Oral Q6H PRN Nanine Means, NP      . gabapentin (NEURONTIN) capsule 100 mg  100 mg Oral TID Rachael Fee, MD   100 mg at 04/13/14 0749  . loratadine (CLARITIN)  tablet 10 mg  10 mg Oral Daily Nanine Means, NP   10 mg at 04/13/14 0749  . LORazepam (ATIVAN) tablet 1 mg  1 mg Oral Q6H PRN Nanine Means, NP   1 mg at 04/12/14 2113  . magnesium hydroxide (MILK OF MAGNESIA) suspension 30 mL  30 mL Oral Daily PRN Nanine Means, NP      . montelukast (SINGULAIR) tablet 10 mg  10 mg Oral Daily Nanine Means, NP   10 mg at 04/13/14 0749  . nicotine (NICODERM CQ - dosed in mg/24 hours) patch 21 mg  21 mg Transdermal Daily Nehemiah Massed, MD   21 mg at 04/13/14 0748  . Oxcarbazepine (TRILEPTAL) tablet 300 mg  300 mg Oral BID Nanine Means, NP   300 mg at 04/12/14 1832  . pramipexole (MIRAPEX) tablet 0.5 mg  0.5 mg Oral QHS Rachael Fee, MD   0.5 mg at 04/12/14 2113  . prazosin (MINIPRESS) capsule 2 mg  2 mg Oral QHS Nanine Means, NP   2 mg at 04/12/14 2113  . zolpidem (AMBIEN) tablet 10 mg  10 mg Oral QHS PRN Rachael Fee, MD   10 mg at 04/12/14 2113    Lab Results: No results found for this or any previous visit (from the past 48 hour(s)).  Physical Findings: AIMS: Facial and Oral Movements Muscles of Facial Expression: None, normal Lips and Perioral Area: None, normal Jaw: None, normal Tongue: None, normal,Extremity Movements Upper (arms, wrists, hands, fingers): None, normal Lower (legs, knees, ankles, toes): None, normal, Trunk Movements Neck, shoulders, hips: None, normal, Overall Severity Severity of abnormal movements (highest score from questions above): None, normal Incapacitation due to abnormal movements: None, normal Patient's awareness of abnormal movements (rate only patient's report): No Awareness, Dental Status Current problems with teeth and/or dentures?: No Does patient usually wear dentures?: No  CIWA:    COWS:     Treatment Plan Summary: Daily contact with patient to assess and evaluate symptoms and progress in treatment Medication management  Plan:   1. Continue crisis management and stabilization.  2. Medication management:   -Continue Thorazine 10 mg BID, 50 mg hs for mood control. -Continue Celexa 40 mg daily for depressive symptoms -Will increase Neurontin 200 mg TID for anxiety and fibromyalgia pain -Continue Minipress 2 mg hs for PTSD symptoms -Continue Trileptal 300 mg BID for mood control.  3. Encouraged patient to attend groups and participate in group counseling sessions and activities.  4. Discharge plan in progress.  5. Continue current treatment plan.  6. Address health issues: Vitals reviewed and stable.   Medical Decision Making Problem Points:  Established problem, stable/improving (1) and Review of psycho-social stressors (1) Data Points:  Review of medication regiment & side effects (2) Review of new medications or change in dosage (2)  I certify  that inpatient services furnished can reasonably be expected to improve the patient's condition.   Malachy Chamber S FNP-BC  04/13/2014, 8:56 AM- I agreed with findings and treatment plan of this patient

## 2014-04-13 NOTE — Plan of Care (Signed)
Problem: Ineffective individual coping Goal: STG: Patient will remain free from self harm Outcome: Progressing Pt not engaging in self harm, denies suicidal ideation at this time

## 2014-04-13 NOTE — [facility] Group Notes (Signed)
[facility] Group Notes: (Clinical Social Work)   04/13/2014      Type of Therapy:  Group Therapy   Participation Level:  Did Not Attend    Ambrose Mantle, LCSW 04/13/2014, 12:39 PM

## 2014-04-13 NOTE — Progress Notes (Signed)
Psychoeducational Group Note  Date:  04/13/2014 Time: 2100 Group Topic/Focus:  wrap up group  Participation Level: Did Not Attend  Participation Quality:  Not Applicable  Affect:  Not Applicable  Cognitive:  Not Applicable  Insight:  Not Applicable  Engagement in Group: Not Applicable  Additional Comments:  Pt was notified that group was beginning but remained in her bed.   Shelah Lewandowsky 04/13/2014, 10:40 PM

## 2014-04-13 NOTE — Progress Notes (Signed)
D.  Pt pleasant on approach, did not attend evening wrap up group, remained in bed.  No complaints voiced other than anxiety at times.  Denies SI/HI/hallucinations at this time.  Interacting appropriately with peers on unit.  A.  Support and encouragement offered  R.  Pt remains safe on unit, will continue to monitor.

## 2014-04-13 NOTE — Progress Notes (Signed)
Patient ID: Morgan [name], female   DOB: 10-11-1990, 23 y.o.   MRN: [id number]   Type of Therapy:  Psychoeducational Skills  Participation Level:  Active  Participation Quality:  Appropriate  Affect:  Appropriate  Cognitive:  Appropriate  Insight:  Appropriate  Engagement in Group:  Engaged  Modes of Intervention:  Discussion  Summary of Progress/Problems: Pt did attend self inventory group, pt reported that she was negative SI/HI, no AH/VH noted. Pt rated her depression as a 3, and her helplessness/hopelessness as a 3.     Pt reported no issues or concerns.

## 2014-04-13 NOTE — Progress Notes (Signed)
D) Pt has been attending the groups and interacting with her peers. Affect and mood are appropriate. Pt rates her depression and hopelessness both at a 4 and her anxiety at a 9. Denies SI and HI. A) Given support, reassurance and praise. Encouraged to go to the groups.  R) Denies SI and HI

## 2014-04-14 NOTE — Progress Notes (Signed)
Morgan [name] [facility] MD Progress Note  04/14/2014 3:33 PM Morgan [name]  MRN:  [id number] Subjective:  [name] endorses that she was given an Ativan to help her sleep last night. States she is still having the ruminative thinking. Admits that the Thorazine has help some at night. Would rather not increase the dose as she is afraid of increased sedation during the day. She states she would like to be a little stronger when she leaves from here. She states her mother is mentally ill and interacting with her does trigger her symptoms. States she has to interact with her as she is her mother. She also admits that she is staying in the relationship with her BF as she needs his support but that she does not love him and without a job she does not have other option but to stay with him. In that sense she feels trapped. Admits that when she starts to think about her situation she gets overwhelmed and the suicidal thoughts come back as an alternative to get out of it.  Diagnosis:   DSM5: Trauma-Stressor Disorders:  Posttraumatic Stress Disorder (309.81) Depressive Disorders:  Major Depressive Disorder - Severe (296.23) Total Time spent with patient: 30 minutes  Axis I: Bipolar, Depressed  ADL's:  Intact  Sleep: slept after being given an Ativan  Appetite:  Fair  Psychiatric Specialty Exam: Physical Exam  Review of Systems  Constitutional: Negative.   HENT: Negative.   Eyes: Negative.   Respiratory: Negative.   Cardiovascular: Negative.   Gastrointestinal: Negative.   Genitourinary: Negative.   Musculoskeletal: Negative.   Skin: Negative.   Neurological: Negative.   Endo/Heme/Allergies: Negative.   Psychiatric/Behavioral: The patient is nervous/anxious and has insomnia.     Blood pressure 125/77, pulse 96, temperature 97.7 F (36.5 C), temperature source Oral, resp. rate 16, height  (1.651 m), weight 94.802 kg (209 lb), last menstrual period 03/14/2014, SpO2 100.00%.Body mass index is 34.78 kg/(m^2).   General Appearance: Fairly Groomed  Patent attorney::  Fair  Speech:  Clear and Coherent  Volume:  Decreased  Mood:  Anxious and worried  Affect:  anxious, worried  Thought Process:  Coherent and Goal Directed  Orientation:  Full (Time, Place, and Person)  Thought Content:  symptoms worries concerns  Suicidal Thoughts:  No  Homicidal Thoughts:  No  Memory:  Immediate;   Fair Recent;   Fair Remote;   Fair  Judgement:  Fair  Insight:  Present  Psychomotor Activity:  Normal  Concentration:  Fair  Recall:  [employer] of Knowledge:NA  Language: Fair  Akathisia:  No  Handed:    AIMS (if indicated):     Assets:  Desire for Improvement Housing  Sleep:  Number of Hours: 6.75   Musculoskeletal: Strength & Muscle Tone: within normal limits Gait & Station: normal Patient leans: N/A  Current Medications: Current Facility-Administered Medications  Medication Dose Route Frequency Provider Last Rate Last Dose  . acetaminophen (TYLENOL) tablet 650 mg  650 mg Oral Q6H PRN Nanine Means, NP   650 mg at 04/11/14 1116  . albuterol (PROVENTIL HFA;VENTOLIN HFA) 108 (90 BASE) MCG/ACT inhaler 2 puff  2 puff Inhalation Q4H PRN Nanine Means, NP      . alum & mag hydroxide-simeth (MAALOX/MYLANTA) 200-200-20 MG/5ML suspension 30 mL  30 mL Oral Q4H PRN Nanine Means, NP      . chlorproMAZINE (THORAZINE) tablet 10 mg  10 mg Oral BID Rachael Fee, MD   10 mg at 04/14/14 0751  .  chlorproMAZINE (THORAZINE) tablet 50 mg  50 mg Oral QHS Rachael Fee, MD   50 mg at 04/13/14 2146  . citalopram (CELEXA) tablet 40 mg  40 mg Oral Daily Rachael Fee, MD   40 mg at 04/14/14 0751  . diphenhydrAMINE (BENADRYL) capsule 25 mg  25 mg Oral Q6H PRN Nanine Means, NP      . gabapentin (NEURONTIN) capsule 200 mg  200 mg Oral TID Truman Hayward, FNP   200 mg at 04/14/14 1210  . loratadine (CLARITIN) tablet 10 mg  10 mg Oral Daily Nanine Means, NP   10 mg at 04/14/14 0751  . LORazepam (ATIVAN) tablet 1 mg  1 mg Oral Q6H PRN  Nanine Means, NP   1 mg at 04/13/14 2146  . magnesium hydroxide (MILK OF MAGNESIA) suspension 30 mL  30 mL Oral Daily PRN Nanine Means, NP      . montelukast (SINGULAIR) tablet 10 mg  10 mg Oral Daily Nanine Means, NP   10 mg at 04/14/14 0946  . nicotine (NICODERM CQ - dosed in mg/24 hours) patch 21 mg  21 mg Transdermal Daily Nehemiah Massed, MD   21 mg at 04/13/14 0748  . Oxcarbazepine (TRILEPTAL) tablet 300 mg  300 mg Oral BID Nanine Means, NP   300 mg at 04/14/14 0947  . pramipexole (MIRAPEX) tablet 0.5 mg  0.5 mg Oral QHS Rachael Fee, MD   0.5 mg at 04/13/14 2145  . prazosin (MINIPRESS) capsule 2 mg  2 mg Oral QHS Nanine Means, NP   2 mg at 04/13/14 2146  . zolpidem (AMBIEN) tablet 10 mg  10 mg Oral QHS PRN Rachael Fee, MD   10 mg at 04/13/14 2146    Lab Results: No results found for this or any previous visit (from the past 48 hour(s)).  Physical Findings: AIMS: Facial and Oral Movements Muscles of Facial Expression: None, normal Lips and Perioral Area: None, normal Jaw: None, normal Tongue: None, normal,Extremity Movements Upper (arms, wrists, hands, fingers): None, normal Lower (legs, knees, ankles, toes): None, normal, Trunk Movements Neck, shoulders, hips: None, normal, Overall Severity Severity of abnormal movements (highest score from questions above): None, normal Incapacitation due to abnormal movements: None, normal Patient's awareness of abnormal movements (rate only patient's report): No Awareness, Dental Status Current problems with teeth and/or dentures?: No Does patient usually wear dentures?: No  CIWA:    COWS:     Treatment Plan Summary: Daily contact with patient to assess and evaluate symptoms and progress in treatment Medication management  Plan: Supportive approach/coping skills           Will continue the Thorazine at 10 mg BID, 50 mg HS           Will work on CBT: identify the cognitive distortions: catastrophic thinking, personalization, filtering.  Will work on developing more accurate ways of perceiving her situation            Medical Decision Making Problem Points:  Review of psychosocial stressors Data Points:  Review of medication regiment & side effects (2)  I certify that inpatient services furnished can reasonably be expected to improve the patient's condition.   Dajuana Palen A 04/14/2014, 3:33 PM

## 2014-04-14 NOTE — Progress Notes (Signed)
D: Pt reports that she feels much better today and is looking forward to going home soon.  A: Support/encouragement given. R: Pt. Receptive, remains safe. Denies SI/HI.

## 2014-04-14 NOTE — Progress Notes (Signed)
D   Pt is appropriate and pleasant   She reports feeling good enough for discharge but is feeling a little anxious about how it will go but thinks that's probably normal   She smiles and expresses thanks for helping her while at [facility]   A   Verbal support given   Medications administered and effectiveness monitored   Q 15 min checks   Encouraged and reassured pt about her feelings about being discharged R   Pt was receptive and is safe at present

## 2014-04-14 NOTE — Progress Notes (Signed)
The focus of this group is to help patients review their daily goal of treatment and discuss progress on daily workbooks. Pt stated her goal for the day was to worry less and beat herself up less. Pt stated that she has been feeling anxious today re: her discharge. Pt reported she coped with her anxiety today by sleeping. Pt stated that she has learned new ways of coping while here at [facility], such as giving herself positive affirmations and communicating with others in a more healthy manner. Pt was engage and supportive throughout group.

## 2014-04-14 NOTE — [facility] Group Notes (Signed)
[facility] LCSW Group Therapy 04/14/2014  1:15 pm  Type of Therapy: Group Therapy Participation Level: Active  Participation Quality: Attentive, Sharing and Supportive  Affect: Appropriate  Cognitive: Alert and Oriented  Insight: Developing/Improving and Engaged  Engagement in Therapy: Developing/Improving and Engaged  Modes of Intervention: Clarification, Confrontation, Discussion, Education, Exploration,  Limit-setting, Orientation, Problem-solving, Rapport Building, Dance movement psychotherapist, Socialization and Support  Summary of Progress/Problems: Pt identified obstacles faced currently and processed barriers involved in overcoming these obstacles. Pt identified steps necessary for overcoming these obstacles and explored motivation (internal and external) for facing these difficulties head on. Pt further identified one area of concern in their lives and chose a goal to focus on for today. Patient shared that she has a tendency to beat herself up and ruminate on things she has done in the past and feelings of guilt. She discussed that she has difficulty forgiving herself for things done in the past and fears that she is less than others. CSW's and other group members offered emotional support and discussed the importance of forgiving oneself.   Samuella Bruin, MSW, [employer] Clinical Social Worker [facility] [phone]

## 2014-04-14 NOTE — [facility] Group Notes (Signed)
   [facility] LCSW Aftercare Discharge Planning Group Note  04/14/2014  8:45 AM   Participation Quality: Alert, Appropriate and Oriented  Mood/Affect: Appropriate  Depression Rating: 2  Anxiety Rating: 2  Thoughts of Suicide: Pt denies SI/HI  Will you contract for safety? Yes  Current AVH: Pt denies  Plan for Discharge/Comments: Pt attended discharge planning group and actively participated in group. CSW provided pt with today's workbook. Patient reports that she is feeling "pretty good" today and feels ready to discharge home today to follow up with [facility].   Transportation Means: Pt reports access to transportation  Supports: Patient's boyfriend has been identified as a strong support.  Samuella Bruin, MSW, [employer] Clinical Social Worker [facility] [phone]

## 2014-04-15 MED ORDER — PRAMIPEXOLE DIHYDROCHLORIDE 0.25 MG PO TABS: 0.2500 mg | ORAL_TABLET | Freq: Every day | ORAL | Status: DC

## 2014-04-15 MED ORDER — ALBUTEROL SULFATE HFA 108 (90 BASE) MCG/ACT IN AERS: 2.0000 | INHALATION_SPRAY | RESPIRATORY_TRACT | Status: DC | PRN

## 2014-04-15 MED ORDER — OXCARBAZEPINE 300 MG PO TABS
300.0000 mg | ORAL_TABLET | Freq: Two times a day (BID) | ORAL | Status: DC
Start: 2014-04-15 — End: 2014-05-21

## 2014-04-15 MED ORDER — ZOLPIDEM TARTRATE 10 MG PO TABS: 10.0000 mg | ORAL_TABLET | Freq: Every evening | ORAL | Status: AC | PRN

## 2014-04-15 MED ORDER — GABAPENTIN 100 MG PO CAPS: 200.0000 mg | ORAL_CAPSULE | Freq: Three times a day (TID) | ORAL | Status: DC

## 2014-04-15 MED ORDER — PRAZOSIN HCL 2 MG PO CAPS: 2.0000 mg | ORAL_CAPSULE | Freq: Every day | ORAL | Status: DC

## 2014-04-15 MED ORDER — CETIRIZINE HCL 10 MG PO TABS: 10.0000 mg | ORAL_TABLET | Freq: Two times a day (BID) | ORAL | Status: DC

## 2014-04-15 MED ORDER — CITALOPRAM HYDROBROMIDE 40 MG PO TABS: 40.0000 mg | ORAL_TABLET | Freq: Every day | ORAL | Status: DC

## 2014-04-15 MED ORDER — CHLORPROMAZINE HCL 50 MG PO TABS: 50.0000 mg | ORAL_TABLET | Freq: Every day | ORAL | Status: DC

## 2014-04-15 MED ORDER — MONTELUKAST SODIUM 10 MG PO TABS: 10.0000 mg | ORAL_TABLET | Freq: Every day | ORAL | Status: DC

## 2014-04-15 MED ORDER — PRAMIPEXOLE DIHYDROCHLORIDE 0.25 MG PO TABS
0.2500 mg | ORAL_TABLET | Freq: Every day | ORAL | Status: DC
  Filled 2014-04-15: qty 14

## 2014-04-15 MED ORDER — DIPHENHYDRAMINE HCL 25 MG PO CAPS: 25.0000 mg | ORAL_CAPSULE | Freq: Four times a day (QID) | ORAL | Status: DC | PRN

## 2014-04-15 MED ORDER — LORAZEPAM 1 MG PO TABS: 1.0000 mg | ORAL_TABLET | Freq: Four times a day (QID) | ORAL | Status: DC | PRN

## 2014-04-15 NOTE — Progress Notes (Signed)
[facility] Adult Case Management Discharge Plan :  Will you be returning to the same living situation after discharge: Yes,  patient will return to current living situation At discharge, do you have transportation home?:Yes,  patient reports having transportation home Do you have the ability to pay for your medications:Yes,  patient will be provided with necessary medication samples and prescriptions at discharge.  Release of information consent forms completed and in the chart;  Patient's signature needed at discharge.  Patient to Follow up at: Follow-up Information   Follow up with [facility]. (Walk in between 8am-12pm Monday through Friday for hospital follow-up/medication management. )    Contact information:   [address] [place] [address] Phone: [phone] Fax: [phone]      Follow up with [facility] On 04/17/2014. (Appt. at 11:00AM with Rudi Rummage for therapy. )    Contact information:   [facility] [address] [place], [place] [address] Phone: [phone] Fax: [phone]      Patient denies SI/HI:   Yes,  denies    Safety Planning and Suicide Prevention discussed:  Yes,  with patient and boyfriend  Kollins Fenter, [place] 04/15/2014, 9:40 AM

## 2014-04-15 NOTE — Progress Notes (Signed)
Pt alert and oriented x4. Pt calm and cooperative. Pt was smiling and laughing and interacting well with others. 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 Pt is hopeful and says she will continue to meet with her support groups to stay sober. Pt denies any SI/HI/AH/VH. Pt received discharge instructions. No questions or concerns. Pt received prescriptions. Pt has been discharged.

## 2014-04-15 NOTE — Tx Team (Signed)
Interdisciplinary Treatment Plan Update (Adult) Date: 04/15/2014   Time Reviewed: 9:30 AM  Progress in Treatment: Attending groups: Yes Participating in groups: Yes Taking medication as prescribed: Yes Tolerating medication: Yes Family/Significant other contact made: Yes, CSW contacted boyfriend Patient understands diagnosis: Yes Discussing patient identified problems/goals with staff: Yes Medical problems stabilized or resolved: Yes Denies suicidal/homicidal ideation: Patient denies SI/HI Issues/concerns per patient self-inventory: Yes Other:  New problem(s) identified: N/A  Discharge Plan or Barriers: Patient agreeable to follow up with [facility] for outpatient services. Discharge anticipated for today 9/15, patient able to return home. She feels safe to discharge and can contract for safety.  Reason for Continuation of Hospitalization:  Depression Anxiety Medication Stabilization   Comments: N/A  Estimated length of stay: D/C anticipated for today 9/15  For review of initial/current patient goals, please see plan of care.  Patient and CSW reviewed pt's identified goals and treatment plan. Patient's goals are: mood stabilization with medication management; establishing aftercare with [facility]; and gaining coping skills through group attendance and participation. Pt verbalized understanding and agreed to treatment plan. Patient's goals reviewed with treatment team.   Attendees: Patient:    Family:    Physician: Dr. Jama Flavors; Dr. Dub Mikes 04/15/2014 9:30 AM  Nursing: Carney Living, Quintella Reichert, RN 04/15/2014 9:30 AM  Clinical Social Worker: Belenda Cruise Rabiah Goeser,  LCSWA 04/15/2014 9:30 AM  Other: Juline Patch, LCSW 04/15/2014 9:30 AM  Other: Leisa Lenz, Vesta Mixer Liaison 04/15/2014 9:30 AM  Other: Serena Colonel, NP 04/15/2014 9:30 AM  Other: Santa Genera, LCSW 04/15/2014 9:30 AM  Other:  Onnie Boer, Case Manager 04/15/2014  9:30 AM  Other:    Other:    Other:        Scribe for Treatment Team:  Samuella Bruin, MSW, [employer] [phone]
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
## 2014-04-15 NOTE — Discharge Summary (Signed)
Physician Discharge Summary Note  Patient:  Morgan [name] is an 23 y.o., female MRN:  [id number] DOB:  1991-05-24 Patient phone:  [phone] (home)  Patient address:   [address] [place] [address],  Total Time spent with patient: Greater than 30 minutes  Date of Admission:  04/07/2014 Date of Discharge: 04/15/14  Reason for Admission:  Mood stabilization treatment  Discharge Diagnoses: Active Problems:   PTSD (post-traumatic stress disorder)   Bipolar affective disorder, current episode depressed   Bipolar affective disorder, depressed, severe   Psychiatric Specialty Exam: Physical Exam  ROS  Blood pressure 106/65, pulse 95, temperature 97.6 F (36.4 C), temperature source Oral, resp. rate 18, height  (1.651 m), weight 94.802 kg (209 lb), last menstrual period 03/14/2014, SpO2 100.00%.Body mass index is 34.78 kg/(m^2).  General Appearance: Fairly Groomed  Patent attorney:: Fair  Speech: Clear and Coherent  Volume: Normal  Mood: Euthymic  Affect: Appropriate  Thought Process: Coherent and Goal Directed  Orientation: Full (Time, Place, and Person)  Thought Content: plans as she moves on  Suicidal Thoughts: No  Homicidal Thoughts: No Memory: Immediate; Fair  Recent; Fair  Remote; Fair  Judgement: Fair  Insight: Present  Psychomotor Activity: Normal  Concentration: Fair  Recall: [employer] of Knowledge:NA  Language: Fair  Akathisia: No  Handed: Ambidextrous  AIMS (if indicated): Assets: Desire for Improvement  Housing  Social Support  Transportation  Sleep: Number of Hours: 6.75   Past Psychiatric History: Diagnosis: Bipolar affective disorder, current episode depressed  Hospitalizations: [facility] adult unit  Outpatient Care: [facility]  Substance Abuse Care: NA  Self-Mutilation: NA  Suicidal Attempts: NA  Violent Behaviors: NA   Musculoskeletal: Strength & Muscle Tone: within normal limits Gait & Station: normal Patient leans:  N/A  DSM5: Schizophrenia Disorders:  NA Obsessive-Compulsive Disorders:  NA Trauma-Stressor Disorders:  Posttraumatic Stress Disorder (309.81) Substance/Addictive Disorders:  NA Depressive Disorders:  Bipolar affective disorder, current episode depressed  Axis Diagnosis:  AXIS I:  Bipolar affective disorder, current episode depressed AXIS II:  Deferred AXIS III:   Past Medical History  Diagnosis Date  . Eczema   . TMJ (dislocation of temporomandibular joint)   . Depression   . ADHD (attention deficit hyperactivity disorder)   . PTSD (post-traumatic stress disorder)   . Anxiety   . Asthma    AXIS IV:  other psychosocial or environmental problems and mental illness, chronic AXIS V:  63  Level of Care:  OP  Hospital Course:  23 Y/o States that after she left here last time in June, she went to [facility] for follow up. Claims compliance. She stayed with her boyfriend. States that about a month ago she started having some of the symptoms coming back. States she completely stop smoking pot. State that she has had tremors since she left here. States that they never got better. The insomnia got worst. Could not shut her brain up as she could not stop thinking. States she can't focus on anything else. State she had a manic episode in April she obsesses over that, obssess over money, the depression, as it will not get better, will not be able to find the right medications. States she feels she is at the end of her ropes.  Ketzia received medication management for mood stabilization while a patient in this hospital. She was medicated and discharged on; Citalopram 40 mg daily for depression, Gabapentin 200 mg three tim daily for agitation, Lorazepam 1 mg  Q 6 hours prn for anxiety, Trileptal 300 mg twice daily for mood stabilization, Prazosin 2 mg Q bedtime for prevention of nightmares and Ambien 10 mg Q bedtime for sleep. She was also resumed on her pertinent home medications for her other  pre-existing medical issues and or concerns. She tolerated her treatment regimen without any adverse effects and or reactions. [place] participated in the group counseling sessions being offered and held on this unit. She learned coping skills.  Liya's symptoms responded well to her treatment regimen. This is evidenced by her reports of improved mood, stable sleep pattern, better focus/concentration and absence of suicidal ideations. She is currently being discharged to continue further psychiatric treatment at the [facility] and  [facility]. She is provided with all the necessary information required to make these appointments without problem.   Upon discharge, she adamantly denies any SIHI, AVH, delusional thoughts and or paranoia. She received from the [facility] pharmacy, a 14 days worth, supply samples of her discharge medications. She left [facility] with all personal belongings in no apparent distress. Transportation per patient's arrangement. Current lab values reviewed, stable.  Consults:  psychiatry  Significant Diagnostic Studies:  labs: CBC with diff, CMP, UDS, toxicology tests, U/A  Discharge Vitals:   Blood pressure 106/65, pulse 95, temperature 97.6 F (36.4 C), temperature source Oral, resp. rate 18, height  (1.651 m), weight 94.802 kg (209 lb), last menstrual period 03/14/2014, SpO2 100.00%. Body mass index is 34.78 kg/(m^2). Lab Results:   No results found for this or any previous visit (from the past 72 hour(s)).  Physical Findings: AIMS: Facial and Oral Movements Muscles of Facial Expression: None, normal Lips and Perioral Area: None, normal Jaw: None, normal Tongue: None, normal,Extremity Movements Upper (arms, wrists, hands, fingers): None, normal Lower (legs, knees, ankles, toes): None, normal, Trunk Movements Neck, shoulders, hips: None, normal, Overall Severity Severity of abnormal movements (highest score from questions above): None, normal Incapacitation  due to abnormal movements: None, normal Patient's awareness of abnormal movements (rate only patient's report): No Awareness, Dental Status Current problems with teeth and/or dentures?: No Does patient usually wear dentures?: No  CIWA:    COWS:     Psychiatric Specialty Exam: See Psychiatric Specialty Exam and Suicide Risk Assessment completed by Attending Physician prior to discharge.  Discharge destination:  Home  Is patient on multiple antipsychotic therapies at discharge:  No   Has Patient had three or more failed trials of antipsychotic monotherapy by history:  No  Recommended Plan for Multiple Antipsychotic Therapies: NA    Medication List    STOP taking these medications       ARIPiprazole 2 MG tablet  Commonly known as:  ABILIFY      TAKE these medications     Indication   albuterol 108 (90 BASE) MCG/ACT inhaler  Commonly known as:  VENTOLIN HFA  Inhale 2 puffs into the lungs every 4 (four) hours as needed for wheezing or shortness of breath.   Indication:  Asthma     cetirizine 10 MG tablet  Commonly known as:  ZYRTEC  Take 1 tablet (10 mg total) by mouth 2 (two) times daily. For allergies   Indication:  Perennial Rhinitis, Hayfever     chlorproMAZINE 50 MG tablet  Commonly known as:  THORAZINE  Take 1 tablet (50 mg total) by mouth at bedtime. For mood control/agitation   Indication:  Mood control     citalopram 40 MG tablet  Commonly known as:  CELEXA  Take 1 tablet (40 mg total) by mouth daily. For depression   Indication:  For depression     diphenhydrAMINE 25 mg capsule  Commonly known as:  BENADRYL  Take 1 capsule (25 mg total) by mouth every 6 (six) hours as needed for allergies.   Indication:  Allergies     gabapentin 100 MG capsule  Commonly known as:  NEURONTIN  Take 2 capsules (200 mg total) by mouth 3 (three) times daily. For agitation/pain   Indication:  Agitation, Fibromyalgia Syndrome, Pain     LORazepam 1 MG tablet  Commonly known  as:  ATIVAN  Take 1 tablet (1 mg total) by mouth every 6 (six) hours as needed (agitation).   Indication:  Feeling Anxious     montelukast 10 MG tablet  Commonly known as:  SINGULAIR  Take 1 tablet (10 mg total) by mouth daily. For asthma   Indication:  Asthma     Oxcarbazepine 300 MG tablet  Commonly known as:  TRILEPTAL  Take 1 tablet (300 mg total) by mouth 2 (two) times daily. For mood stabilization   Indication:  Mood stabilization     pramipexole 0.25 MG tablet  Commonly known as:  MIRAPEX  Take 1 tablet (0.25 mg total) by mouth at bedtime. For restless legs   Indication:  Restless Leg Syndrome     prazosin 2 MG capsule  Commonly known as:  MINIPRESS  Take 1 capsule (2 mg total) by mouth at bedtime. For nightmares   Indication:  Nightmares     zolpidem 10 MG tablet  Commonly known as:  AMBIEN  Take 1 tablet (10 mg total) by mouth at bedtime as needed for sleep.   Indication:  Trouble Sleeping       Follow-up Information   Follow up with [facility]. (Walk in between 8am-12pm Monday through Friday for hospital follow-up/medication management. )    Contact information:   [address], [place] [address] Phone: [phone] Fax: [phone]      [facility] with [facility] On 04/17/2014. (Appt. at 11:00AM with Rudi Rummage for therapy. )    Contact information:   [facility] [address] [place], [place] [address] Phone: [phone] Fax: [phone]     Follow-up recommendations:  Activity:  As tolerated Diet: As recommended by your primary care doctor. Keep all scheduled follow-up appointments as recommended.  Comments:  Take all your medications as prescribed by your mental healthcare provider. Report any adverse effects and or reactions from your medicines to your outpatient provider promptly. Patient is instructed and cautioned to not engage in alcohol and or illegal drug use while on prescription medicines. In the event of  worsening symptoms, patient is instructed to call the crisis hotline, 911 and or go to the nearest ED for appropriate evaluation and treatment of symptoms. Follow-up with your primary care provider for your other medical issues, concerns and or health care needs.   Total Discharge Time:  Greater than 30 minutes.  Signed: Sanjuana Kava, PMHNP-BC 04/15/2014, 2:42 PM I personally assessed the patient and formulated the plan Madie Reno A. Dub Mikes, M.D.

## 2014-04-17 NOTE — Progress Notes (Signed)
Patient Discharge Instructions:  After Visit Summary (AVS):   Faxed to:  04/17/14 Discharge Summary Note:   Faxed to:  04/17/14 Psychiatric Admission Assessment Note:   Faxed to:  04/17/14 Suicide Risk Assessment - Discharge Assessment:   Faxed to:  04/17/14 Faxed/Sent to the Next Level Care provider:  04/17/14 Faxed to [facility] @ [phone] Faxed to [facility] @ [phone] Jerelene Redden, 04/17/2014, 3:54 PM

## 2014-05-21 ENCOUNTER — Emergency Department (HOSPITAL_COMMUNITY)
Admission: EM | Admit: 2014-05-21 | Discharge: 2014-05-21 | Disposition: A | Discharge status: Other Acute Inpatient Hospital | Payer: Self-pay | Attending: Emergency Medicine | Admitting: Emergency Medicine

## 2014-05-21 ENCOUNTER — Inpatient Hospital Stay (HOSPITAL_COMMUNITY)
Admission: AD | Admit: 2014-05-21 | Discharge: 2014-05-26 | DRG: 885 | Disposition: A | Discharge status: Home / Self Care | Payer: Self-pay | Source: Intra-hospital | Attending: Psychiatry | Admitting: Psychiatry

## 2014-05-21 ENCOUNTER — Encounter (HOSPITAL_COMMUNITY): Payer: Self-pay

## 2014-05-21 ENCOUNTER — Encounter (HOSPITAL_COMMUNITY): Payer: Self-pay | Admitting: Emergency Medicine

## 2014-05-21 DIAGNOSIS — Z818 Family history of other mental and behavioral disorders: Secondary | ICD-10-CM

## 2014-05-21 DIAGNOSIS — G4700 Insomnia, unspecified: Secondary | ICD-10-CM | POA: Diagnosis present

## 2014-05-21 DIAGNOSIS — F1410 Cocaine abuse, uncomplicated: Secondary | ICD-10-CM | POA: Insufficient documentation

## 2014-05-21 DIAGNOSIS — Z87891 Personal history of nicotine dependence: Secondary | ICD-10-CM | POA: Insufficient documentation

## 2014-05-21 DIAGNOSIS — Z3202 Encounter for pregnancy test, result negative: Secondary | ICD-10-CM | POA: Insufficient documentation

## 2014-05-21 DIAGNOSIS — F332 Major depressive disorder, recurrent severe without psychotic features: Principal | ICD-10-CM | POA: Diagnosis present

## 2014-05-21 DIAGNOSIS — M797 Fibromyalgia: Secondary | ICD-10-CM | POA: Diagnosis present

## 2014-05-21 DIAGNOSIS — Z79899 Other long term (current) drug therapy: Secondary | ICD-10-CM | POA: Insufficient documentation

## 2014-05-21 DIAGNOSIS — Z872 Personal history of diseases of the skin and subcutaneous tissue: Secondary | ICD-10-CM | POA: Insufficient documentation

## 2014-05-21 DIAGNOSIS — F321 Major depressive disorder, single episode, moderate: Secondary | ICD-10-CM | POA: Insufficient documentation

## 2014-05-21 DIAGNOSIS — J45909 Unspecified asthma, uncomplicated: Secondary | ICD-10-CM | POA: Insufficient documentation

## 2014-05-21 DIAGNOSIS — F1220 Cannabis dependence, uncomplicated: Secondary | ICD-10-CM | POA: Diagnosis present

## 2014-05-21 DIAGNOSIS — F1210 Cannabis abuse, uncomplicated: Secondary | ICD-10-CM | POA: Insufficient documentation

## 2014-05-21 DIAGNOSIS — F902 Attention-deficit hyperactivity disorder, combined type: Secondary | ICD-10-CM | POA: Diagnosis present

## 2014-05-21 DIAGNOSIS — R45851 Suicidal ideations: Secondary | ICD-10-CM

## 2014-05-21 DIAGNOSIS — Z8739 Personal history of other diseases of the musculoskeletal system and connective tissue: Secondary | ICD-10-CM | POA: Insufficient documentation

## 2014-05-21 DIAGNOSIS — F419 Anxiety disorder, unspecified: Secondary | ICD-10-CM | POA: Insufficient documentation

## 2014-05-21 DIAGNOSIS — Z8249 Family history of ischemic heart disease and other diseases of the circulatory system: Secondary | ICD-10-CM

## 2014-05-21 DIAGNOSIS — F331 Major depressive disorder, recurrent, moderate: Secondary | ICD-10-CM

## 2014-05-21 DIAGNOSIS — Z599 Problem related to housing and economic circumstances, unspecified: Secondary | ICD-10-CM

## 2014-05-21 DIAGNOSIS — Z833 Family history of diabetes mellitus: Secondary | ICD-10-CM

## 2014-05-21 DIAGNOSIS — F4310 Post-traumatic stress disorder, unspecified: Secondary | ICD-10-CM | POA: Diagnosis present

## 2014-05-21 LAB — COMPREHENSIVE METABOLIC PANEL
ALT: 12 U/L (ref 0–35)
AST: 13 U/L (ref 0–37)
Albumin: 4 g/dL (ref 3.5–5.2)
Alkaline Phosphatase: 78 U/L (ref 39–117)
Anion gap: 18 — ABNORMAL HIGH (ref 5–15)
BILIRUBIN TOTAL: 0.3 mg/dL (ref 0.3–1.2)
BUN: 13 mg/dL (ref 6–23)
CHLORIDE: 103 meq/L (ref 96–112)
CO2: 20 meq/L (ref 19–32)
Calcium: 9.7 mg/dL (ref 8.4–10.5)
Creatinine, Ser: 0.77 mg/dL (ref 0.50–1.10)
GFR calc Af Amer: >90 mL/min (ref >90)
Glucose, Bld: 112 mg/dL — ABNORMAL HIGH (ref 70–99)
Potassium: 3.7 mEq/L (ref 3.7–5.3)
SODIUM: 141 meq/L (ref 137–147)
Total Protein: 8.3 g/dL (ref 6.0–8.3)

## 2014-05-21 LAB — RAPID URINE DRUG SCREEN, HOSP PERFORMED
AMPHETAMINES: NOT DETECTED
BARBITURATES: NOT DETECTED
BENZODIAZEPINES: NOT DETECTED
Cocaine: POSITIVE — AB
Opiates: NOT DETECTED
Tetrahydrocannabinol: POSITIVE — AB

## 2014-05-21 LAB — CBC
HCT: 42.2 % (ref 36.0–46.0)
Hemoglobin: 14.3 g/dL (ref 12.0–15.0)
MCH: 28.4 pg (ref 26.0–34.0)
MCHC: 33.9 g/dL (ref 30.0–36.0)
MCV: 83.7 fL (ref 78.0–100.0)
PLATELETS: 299 10*3/uL (ref 150–400)
RBC: 5.04 MIL/uL (ref 3.87–5.11)
RDW: 14.5 % (ref 11.5–15.5)
WBC: 7.1 10*3/uL (ref 4.0–10.5)

## 2014-05-21 LAB — SALICYLATE LEVEL: Salicylate Lvl: <2 mg/dL — ABNORMAL LOW (ref 2.8–20.0)

## 2014-05-21 LAB — ETHANOL: Alcohol, Ethyl (B): <11 mg/dL (ref 0–11)

## 2014-05-21 LAB — ACETAMINOPHEN LEVEL: Acetaminophen (Tylenol), Serum: <15 ug/mL (ref 10–30)

## 2014-05-21 LAB — POC URINE PREG, ED: PREG TEST UR: NEGATIVE

## 2014-05-21 MED ORDER — ACETAMINOPHEN 325 MG PO TABS: 650.0000 mg | ORAL_TABLET | Freq: Four times a day (QID) | ORAL | Status: DC | PRN

## 2014-05-21 MED ORDER — TRAZODONE HCL 50 MG PO TABS
50.0000 mg | ORAL_TABLET | Freq: Every evening | ORAL | Status: DC | PRN
  Administered 2014-05-21: 50 mg via ORAL
  Filled 2014-05-21 (×4): qty 1

## 2014-05-21 MED ORDER — FLUOXETINE HCL 20 MG PO CAPS
20.0000 mg | ORAL_CAPSULE | Freq: Every day | ORAL | Status: DC
  Administered 2014-05-22 – 2014-05-25 (×4): 20 mg via ORAL
  Filled 2014-05-21 (×6): qty 1

## 2014-05-21 MED ORDER — LORAZEPAM 1 MG PO TABS: 1.0000 mg | ORAL_TABLET | Freq: Three times a day (TID) | ORAL | Status: DC | PRN

## 2014-05-21 MED ORDER — ALBUTEROL SULFATE HFA 108 (90 BASE) MCG/ACT IN AERS: 2.0000 | INHALATION_SPRAY | RESPIRATORY_TRACT | Status: DC | PRN

## 2014-05-21 MED ORDER — MONTELUKAST SODIUM 10 MG PO TABS
10.0000 mg | ORAL_TABLET | Freq: Every day | ORAL | Status: DC
  Administered 2014-05-22 – 2014-05-26 (×4): 10 mg via ORAL
  Filled 2014-05-21 (×7): qty 1

## 2014-05-21 MED ORDER — PRAMIPEXOLE DIHYDROCHLORIDE 0.25 MG PO TABS
0.2500 mg | ORAL_TABLET | Freq: Every day | ORAL | Status: DC
  Administered 2014-05-21: 0.25 mg via ORAL
  Filled 2014-05-21 (×3): qty 1

## 2014-05-21 MED ORDER — ALUM & MAG HYDROXIDE-SIMETH 200-200-20 MG/5ML PO SUSP: 30.0000 mL | ORAL | Status: DC | PRN

## 2014-05-21 MED ORDER — MAGNESIUM HYDROXIDE 400 MG/5ML PO SUSP
30.0000 mL | Freq: Every day | ORAL | Status: DC | PRN
Start: 2014-05-21 — End: 2014-05-26

## 2014-05-21 MED ORDER — IBUPROFEN 400 MG PO TABS: 600.0000 mg | ORAL_TABLET | Freq: Three times a day (TID) | ORAL | Status: DC | PRN

## 2014-05-21 MED ORDER — LORAZEPAM 1 MG PO TABS
1.0000 mg | ORAL_TABLET | Freq: Four times a day (QID) | ORAL | Status: DC | PRN
  Administered 2014-05-22 – 2014-05-25 (×4): 1 mg via ORAL
  Filled 2014-05-21 (×4): qty 1

## 2014-05-21 MED ORDER — LORATADINE 10 MG PO TABS
10.0000 mg | ORAL_TABLET | Freq: Every day | ORAL | Status: DC
  Administered 2014-05-22 – 2014-05-26 (×4): 10 mg via ORAL
  Filled 2014-05-21 (×7): qty 1

## 2014-05-21 MED ORDER — ZOLPIDEM TARTRATE 5 MG PO TABS: 5.0000 mg | ORAL_TABLET | Freq: Every evening | ORAL | Status: DC | PRN

## 2014-05-21 MED ORDER — GABAPENTIN 100 MG PO CAPS
200.0000 mg | ORAL_CAPSULE | Freq: Three times a day (TID) | ORAL | Status: DC
  Filled 2014-05-21 (×4): qty 2

## 2014-05-21 NOTE — ED Notes (Signed)
Called to request a sitter, called security to wand pt.
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
## 2014-05-21 NOTE — Tx Team (Signed)
Initial Interdisciplinary Treatment Plan   PATIENT STRESSORS: Financial difficulties Health problems Loss of significant relationship   PROBLEM LIST: Problem List/Patient Goals Date to be addressed Date deferred Reason deferred Estimated date of resolution  depression 05/21/2014     anxiety 05/21/2014     Loss of job 05/21/2014     Hx of fibromyagia 05/21/2014                                    DISCHARGE CRITERIA:  Improved stabilization in mood, thinking, and/or behavior Motivation to continue treatment in a less acute level of care Need for constant or close observation no longer present Verbal commitment to aftercare and medication compliance  PRELIMINARY DISCHARGE PLAN: Participate in family therapy Return to previous living arrangement  PATIENT/FAMIILY INVOLVEMENT: This treatment plan has been presented to and reviewed with the patient, Galvin ProfferJenna A Redondo, and/or family member,  The patient and family have been given the opportunity to ask questions and make suggestions.  JEHU-APPIAH, Alfredia Desanctis K 05/21/2014, 11:02 PM

## 2014-05-21 NOTE — BH Assessment (Signed)
Patient discussed with provider Beau FannyJohn C Withrow, FNP; patient meets criteria and accepted [facility] bed [facility], Gretta ArabKenisha Herbin, RN. Patient's attending currently is Dr Ezequiel GanserZavitz ho was informed of disposition along with pt's RN Jacky KindleAshura Lovvorn who will be calling [facility] into [facility] Carney Bernatherine C Shaleah Nissley, LCSW

## 2014-05-21 NOTE — BH Assessment (Signed)
Completed tele assessment with patient at 4:20 PM; patient gave verbal consent for writer to contact boyfriend Thayer OhmChris at [phone] for collateral information. Thayer OhmChris agreeable top talk with caller after clocking out at work.  Carney Bernatherine C Harrill, LCSW

## 2014-05-21 NOTE — ED Notes (Signed)
[facility] called. 

## 2014-05-21 NOTE — ED Notes (Signed)
Asked pt to provide urine specimen pt stated she could not provide one at this time.  

## 2014-05-21 NOTE — BH Assessment (Signed)
[facility] spoke with patient's attending, Dr Rhunette CroftNanavati, at 3:35 PM before arranging TTS assessment. Patient presented to ED with SI, reportedly was [facility] one [facility] ago. Following recent change in medications patient reports decompensation. History of depression.  Carney Bernatherine C Taft Worthing, LCSW

## 2014-05-21 NOTE — ED Notes (Signed)
Pt reports being very depressed and having suicidal thoughts, hx of suicide attempt in past but has made no attempt or have any plan today. Pt tearful at triage.

## 2014-05-21 NOTE — ED Provider Notes (Signed)
CSN: [id number]     Arrival date & time 05/21/14  1221 History   First MD Initiated Contact with Patient 05/21/14 1342     Chief Complaint  Patient presents with  . Suicidal  . Medical Clearance     (Consider location/radiation/quality/duration/timing/severity/associated sxs/prior Treatment) HPI Comments: Pt comes in with cc of suicidal ideations. States that she has been depressed and feeling suicidal for the past 2 weeks. No definite plan. There is hx of overdose attempt 1 year ago. Pt has increase stress in her life, mostly financial. Last admission 1 week ago, at [facility]. No si, hi, Pt denies nausea, emesis, fevers, chills, chest pains, shortness of breath, headaches, abdominal pain, uti like symptoms.   The history is provided by the patient.    Past Medical History  Diagnosis Date  . Eczema   . TMJ (dislocation of temporomandibular joint)   . Depression   . ADHD (attention deficit hyperactivity disorder)   . PTSD (post-traumatic stress disorder)   . Anxiety   . Asthma    Past Surgical History  Procedure Laterality Date  . Addenoid    . Foot surgery     Family History  Problem Relation Age of Onset  . Depression Mother   . Hypertension Mother   . Hypertension Maternal Grandmother   . Hypertension Maternal Grandfather   . Diabetes Maternal Grandfather   . Hypertension Paternal Grandmother   . Diabetes Paternal Grandmother   . Heart murmur Mother    History  Substance Use Topics  . Smoking status: Former Smoker -- 0.50 packs/day for 2 years    Types: Cigarettes  . Smokeless tobacco: Current User  . Alcohol Use: 1.2 oz/week    2 Shots of liquor per week     Comment: social / twice a month   OB History   Grav Para Term Preterm Abortions TAB SAB Ect Mult Living                 Review of Systems  Constitutional: Negative for activity change.  Respiratory: Negative for shortness of breath.   Cardiovascular: Negative for chest pain.   Gastrointestinal: Negative for nausea, vomiting and abdominal pain.  Genitourinary: Negative for dysuria.  Musculoskeletal: Negative for neck pain.  Neurological: Negative for headaches.  Psychiatric/Behavioral: Positive for suicidal ideas.      Allergies  Food; Peanut-containing drug products; Soy allergy; Geodon; Lactose intolerance (gi); Vitamin c; and Sulfa antibiotics  Home Medications   Prior to Admission medications   Medication Sig Start Date End Date Taking? Authorizing Provider  albuterol (VENTOLIN HFA) 108 (90 BASE) MCG/ACT inhaler Inhale 2 puffs into the lungs every 4 (four) hours as needed for wheezing or shortness of breath. 04/15/14   Sanjuana KavaAgnes I Nwoko, NP  cetirizine (ZYRTEC) 10 MG tablet Take 1 tablet (10 mg total) by mouth 2 (two) times daily. For allergies 04/15/14   Sanjuana KavaAgnes I Nwoko, NP  chlorproMAZINE (THORAZINE) 50 MG tablet Take 1 tablet (50 mg total) by mouth at bedtime. For mood control/agitation 04/15/14   Sanjuana KavaAgnes I Nwoko, NP  citalopram (CELEXA) 40 MG tablet Take 1 tablet (40 mg total) by mouth daily. For depression 04/15/14   Sanjuana KavaAgnes I Nwoko, NP  diphenhydrAMINE (BENADRYL) 25 mg capsule Take 1 capsule (25 mg total) by mouth every 6 (six) hours as needed for allergies. 04/15/14   Sanjuana KavaAgnes I Nwoko, NP  gabapentin (NEURONTIN) 100 MG capsule Take 2 capsules (200 mg total) by mouth 3 (three) times daily. For agitation/pain  04/15/14   Sanjuana KavaAgnes I Nwoko, NP  LORazepam (ATIVAN) 1 MG tablet Take 1 tablet (1 mg total) by mouth every 6 (six) hours as needed (agitation). 04/15/14   Sanjuana KavaAgnes I Nwoko, NP  montelukast (SINGULAIR) 10 MG tablet Take 1 tablet (10 mg total) by mouth daily. For asthma 04/15/14   Sanjuana KavaAgnes I Nwoko, NP  Oxcarbazepine (TRILEPTAL) 300 MG tablet Take 1 tablet (300 mg total) by mouth 2 (two) times daily. For mood stabilization 04/15/14   Sanjuana KavaAgnes I Nwoko, NP  pramipexole (MIRAPEX) 0.25 MG tablet Take 1 tablet (0.25 mg total) by mouth at bedtime. For restless legs 04/15/14   Sanjuana KavaAgnes I Nwoko, NP   prazosin (MINIPRESS) 2 MG capsule Take 1 capsule (2 mg total) by mouth at bedtime. For nightmares 04/15/14   Sanjuana KavaAgnes I Nwoko, NP   BP 108/71  Pulse 72  Temp(Src) 98.1 F (36.7 C) (Oral)  Resp 18  Ht 5\' 5"  (1.651 m)  Wt 210 lb (95.255 kg)  BMI 34.95 kg/m2  SpO2 98%  LMP 05/07/2014 Physical Exam  Nursing note and vitals reviewed. Constitutional: She appears well-developed.  HENT:  Head: Normocephalic.  Eyes: Conjunctivae are normal.  Neck: Neck supple.  Cardiovascular: Normal rate.   Pulmonary/Chest: Effort normal.  Abdominal: Soft. She exhibits no distension. There is no tenderness.    ED Course  Procedures (including critical care time) Labs Review Labs Reviewed  COMPREHENSIVE METABOLIC PANEL - Abnormal; Notable for the following:    Glucose, Bld 112 (*)    Anion gap 18 (*)    All other components within normal limits  SALICYLATE LEVEL - Abnormal; Notable for the following:    Salicylate Lvl <2.0 (*)    All other components within normal limits  URINE RAPID DRUG SCREEN (HOSP PERFORMED) - Abnormal; Notable for the following:    Cocaine POSITIVE (*)    Tetrahydrocannabinol POSITIVE (*)    All other components within normal limits  ACETAMINOPHEN LEVEL  CBC  ETHANOL  POC URINE PREG, ED    Imaging Review No results found.   EKG Interpretation None      MDM   Final diagnoses:  Suicidal ideations  MDD (major depressive disorder), recurrent episode, moderate   Pt comes in with cc of SI. Pt has hx of major depression. No complains at all currently, from medical side. No chest pain or dib, + cocaine in the system. Will get psych to eval. Pt is voluntary, she has no plan and no attempt - so we will not get IVC paperwork done.   Derwood KaplanAnkit Darci Lykins, MD 05/21/14 [phone]

## 2014-05-21 NOTE — ED Notes (Signed)
Pt placed into scrubs and wanded by security and belongings bagged labeled and out of pts room.

## 2014-05-21 NOTE — BH Assessment (Signed)
Support paperwork faxed to patient's RN at [phone] after RN agreed to have patient sign. Carney Bernatherine C Harrill, LCSW

## 2014-05-21 NOTE — ED Notes (Signed)
TTS at bedside. 

## 2014-05-21 NOTE — ED Notes (Signed)
Pt attempted to call mom, did not answer.

## 2014-05-22 ENCOUNTER — Encounter (HOSPITAL_COMMUNITY): Payer: Self-pay

## 2014-05-22 DIAGNOSIS — F1220 Cannabis dependence, uncomplicated: Secondary | ICD-10-CM

## 2014-05-22 DIAGNOSIS — R45851 Suicidal ideations: Secondary | ICD-10-CM

## 2014-05-22 LAB — LIPID PANEL
CHOL/HDL RATIO: 3.1 ratio
CHOLESTEROL: 176 mg/dL (ref 0–200)
HDL: 57 mg/dL (ref >39)
LDL CALC: 103 mg/dL — AB (ref 0–99)
Triglycerides: 79 mg/dL (ref <150)
VLDL: 16 mg/dL (ref 0–40)

## 2014-05-22 LAB — HEMOGLOBIN A1C
Hgb A1c MFr Bld: 5.4 % (ref <5.7)
MEAN PLASMA GLUCOSE: 108 mg/dL (ref <117)

## 2014-05-22 LAB — TSH: TSH: 1.45 u[IU]/mL (ref 0.350–4.500)

## 2014-05-22 MED ORDER — ARIPIPRAZOLE 2 MG PO TABS
2.0000 mg | ORAL_TABLET | Freq: Every day | ORAL | Status: DC
  Administered 2014-05-22 – 2014-05-26 (×5): 2 mg via ORAL
  Filled 2014-05-22 (×7): qty 1

## 2014-05-22 MED ORDER — MIRTAZAPINE 15 MG PO TABS
15.0000 mg | ORAL_TABLET | Freq: Every day | ORAL | Status: DC
  Administered 2014-05-22 – 2014-05-25 (×4): 15 mg via ORAL
  Filled 2014-05-22 (×6): qty 1

## 2014-05-22 MED ORDER — GABAPENTIN 100 MG PO CAPS
200.0000 mg | ORAL_CAPSULE | Freq: Two times a day (BID) | ORAL | Status: DC
  Filled 2014-05-22 (×11): qty 2
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
## 2014-05-22 NOTE — Clinical Social Work Note (Signed)
CSW left voicemail for therapist Malcolm MetroJokena Islam at [facility] regarding scheduling upcoming appointment. Awaiting return call.  Samuella BruinKristin Jadene Stemmer, MSW, [employer]CSWA Clinical Social Worker [facility] [phone]

## 2014-05-22 NOTE — Progress Notes (Signed)
Patient ID: Morgan ProfferJenna A [name], female   DOB: Jul 09, 1991, 23 y.o.   MRN: [id number] Admission note: D:Patient is a voluntary admission in no acute distress for depression, SI with a plan to OD on her medications. Pt reports increase depression for the past two weeks and feels like her medication are not working. Pt reports current stressor as relationship and financial.  Pt reports she quit her job at [employer] because of her fibromyalgia. Pt reports issues with her current boyfriend but states "they are working it out". Pt endorses passive suicidal ideation without a plan and verbally contract to come to staff. Pt denies HI/AVH and pain.    A: Pt admitted to unit per protocol, skin assessment and belonging search done. No skin issues noted. Consent signed by pt. Pt educated on therapeutic milieu rules. Pt was introduced to milieu by nursing staff. Fall risk safety plan explained to the patient. 15 minutes checks started for safety.  R: Pt was receptive to education. Writer offered support.

## 2014-05-22 NOTE — [facility] Suicide Risk Assessment (Signed)
[facility] INPATIENT:  Family/Significant Other Suicide Prevention Education  Suicide Prevention Education:  Education Completed; boyfriend Morgan [name] [phone],  (name of family member/significant other) has been identified by the patient as the family member/significant other with whom the patient will be residing, and identified as the person(s) who will aid the patient in the event of a mental health crisis (suicidal ideations/suicide attempt).  With written consent from the patient, the family member/significant other has been provided the following suicide prevention education, prior to the and/or following the discharge of the patient.  The suicide prevention education provided includes the following:  Suicide risk factors  Suicide prevention and interventions  National Suicide Hotline telephone number  [facility] assessment telephone number  [facility] Emergency Assistance 911  [facility] and/or Residential Mobile Crisis Unit telephone number  Request made of family/significant other to:  Remove weapons (e.g., guns, rifles, knives), all items previously/currently identified as safety concern.    Remove drugs/medications (over-the-counter, prescriptions, illicit drugs), all items previously/currently identified as a safety concern.  The family member/significant other verbalizes understanding of the suicide prevention education information provided.  The family member/significant other agrees to remove the items of safety concern listed above.  Morgan [name], Morgan [name] 05/22/2014, 4:23 PM

## 2014-05-22 NOTE — Progress Notes (Signed)
D: Pt presents with flat affect and depressed mood. Pt reports increased depressed today and continues to worry about finances. Pt denies suicidal thoughts at this time but contracts for safety. Pt agreed to talk to staff if symptoms worsen. Pt requested prn meds for anxiety. Prn med administered as ordered at pts request.  Pt refused to take neurontin because she do not like the way it makes her feel.  A: Medications administered as ordered per MD. Verbal support given. Pt encouraged to attend groups. 15 minute checks performed for safety.  R: Pt safety maintained at this time. Pt receptive to treatment.
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
## 2014-05-22 NOTE — Clinical Social Work Note (Signed)
CSW attempted to contact patient's boyfriend Mitzi HansenChris Walters [phone], left voicemail. Awaiting return call.  Samuella BruinKristin Mia Milan, MSW, [employer]CSWA Clinical Social Worker [facility] [phone]

## 2014-05-22 NOTE — [facility] Group Notes (Signed)
[facility] LCSW Group Therapy 05/22/2014  1:15 pm   Type of Therapy: Group Therapy Participation Level: Active  Participation Quality: Attentive, Sharing and Supportive  Affect: Depressed and Flat  Cognitive: Alert and Oriented  Insight: Developing/Improving and Engaged  Engagement in Therapy: Developing/Improving and Engaged  Modes of Intervention: Clarification, Confrontation, Discussion, Education, Exploration, Limit-setting, Orientation, Problem-solving, Rapport Building, Dance movement psychotherapisteality Testing, Socialization and Support  Summary of Progress/Problems: The topic for group was balance in life. Today's group focused on defining balance in one's own words, identifying things that can knock one off balance, and exploring healthy ways to maintain balance in life. Group members were asked to provide an example of a time when they felt off balance, describe how they handled that situation,and process healthier ways to regain balance in the future. Group members were asked to share the most important tool for maintaining balance that they [facility] and how they plan to apply this method after discharge. Patient shared that balance in life means finding hope. Patient shared that she has supportive people in her life and would like to get her medications adjusted. CSW's provided emotional support and encouragement.  Samuella BruinKristin Cassandra Harbold, MSW, [employer]CSWA Clinical Social Worker [facility] [phone]

## 2014-05-22 NOTE — Progress Notes (Signed)
[facility] Group Notes:  (Nursing/MHT/Case Management/Adjunct)  Date:  05/22/2014  Time: 2100 Type of Therapy:  wrap up group  Participation Level:  Active  Participation Quality:  Appropriate, Attentive, Sharing and Supportive  Affect:  Appropriate  Cognitive:  Appropriate  Insight:  Lacking  Engagement in Group:  Limited  Modes of Intervention:  Clarification, Education and Support  Summary of Progress/Problems: Pt reports that she is still in a depressed state but her medication has been changed. Pt describes her last year as awful and needs to start feeling better first before she can get perspective on the future. Pt does report wanting to exercise more and get "back into the job game".  Shelah LewandowskySquires, Bela Nyborg Carol 05/22/2014, 11:12 PM

## 2014-05-22 NOTE — Progress Notes (Signed)
Patient ID: Galvin ProfferJenna A Gaskins, female   DOB: 03/17/1991, 23 y.o.   MRN: [id number] D: client visible on unit, had visit from BF this evening reports depression at "7" of 10, says she started doing drugs again "I did quit for 48 days"  Client notes a decrease in depression. Client says having BF here has helped. Client also notes groups are supportive, she talked about group with SW, client focus was on information given to "take one day at a time." client talked about her job and wanting to work again. A: Writer introduced self to client, provided emotional support, noting that jobs promote a drug free environment and randomly test for such. Writer encouraged client to think about what things she like to do and move forward towards a career in that area. Staff will monitor q1115min for safety. R: client is safe on the unit, attended group.

## 2014-05-23 NOTE — Plan of Care (Signed)
Problem: Alteration in mood Goal: STG-Patient reports thoughts of self-harm to staff Outcome: Progressing Pt is denying and suicidal ideations and does verbally contract to come to staff.

## 2014-05-23 NOTE — [facility] Group Notes (Signed)
[facility] LCSW Group Therapy 05/23/2014  1:15 PM   Type of Therapy: Group Therapy  Participation Level: Did Not Attend.   Samuella BruinKristin Cammeron Greis, MSW, [employer]CSWA Clinical Social Worker [facility] [phone]

## 2014-05-23 NOTE — Progress Notes (Signed)
Patient ID: Galvin ProfferJenna A Meiser, female   DOB: [birth year], 23 y.o.   MRN: [id number] D: Client visible on the unit, had visit from BF today. Client reports depression and anxiety at "7" of 10. A: Writer reviewed medications, administered as ordered. Writer encouraged client to report any concerns. Staff will monitor q7815min for safety. R: Client is safe on the unit, attended group.

## 2014-05-23 NOTE — [facility] Group Notes (Signed)
Adult Psychoeducational Group Note  Date:  05/23/2014 Time:  10:25 PM  Group Topic/Focus:  AA Meeting  Participation Level:  Active  Participation Quality:  Appropriate  Affect:  Appropriate  Cognitive:  Appropriate  Insight: Appropriate  Engagement in Group:  Engaged  Modes of Intervention:  Discussion and Education  Additional Comments:  Eileen StanfordJenna was engaged during group.  She explained her struggle with substance abuse.  Caroll RancherLindsay, Momina Hunton A 05/23/2014, 10:25 PM

## 2014-05-23 NOTE — Progress Notes (Signed)
[facility] MD Progress Note  05/23/2014 11:06 AM Morgan [name]  MRN:  [id number] Subjective: Patient states that she feels " tired" today, but does state that she is feeling " a little better" compared to admission. Objective:  I have discussed case with treatment team and also met with patient. Patient continues to present with depression and subdued, constricted affect, soft speech. However, she does smile briefly at times. She states that her boyfriend came to visit  Yesterday evening and she did  Brighten briefly during this visit. She does not endorse any medication side effects, and is reporting hope that the medications will help. As per staff, patient has been going to some groups, although she does tend to isolate in her room at times. No disruptive behaviors on unit.  Today we reviewed the importance of abstaining from cannabis, which patient uses regularly, as it could be having a negative impact on her ability to function and her mood disorder.  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 rate 18, height 5' 2.99" (1.6 m), weight 98.431 kg (217 lb), last menstrual period 05/07/2014, SpO2 100.00%.Body mass index is 38.45 kg/(m^2).  General Appearance: Disheveled  Eye Sport and exercise psychologist::  Fair  Speech:  Slow  Volume:  Decreased  Mood:  Depressed  Affect:  Constricted- she does smile briefly at times   Thought Process:  Linear  Orientation:  Other:  fully alert and attentive  Thought Content:  denies hallucinations, no delusions are expressed and does not appear internally preoccupied    Suicidal Thoughts:  No- at this time denies any active suicidal thoughts or plan or intent  , contracts for safety on the unit  Homicidal Thoughts:  No  Memory:  recent and remote grossly intact   Judgement:  Fair  Insight:  Fair  Psychomotor Activity:  Decreased  Concentration:  Good  Recall:  Good  Fund of Knowledge:Good  Language: Good  Akathisia:  No  Handed:  Right  AIMS (if indicated):     Assets:  Desire for Improvement Resilience Social Support  Sleep:  Number of Hours: 6.75   Musculoskeletal: Strength & Muscle Tone: within normal limits Gait & Station: normal Patient leans: N/A  Current Medications: Current Facility-Administered Medications  Medication Dose Route Frequency Provider Last Rate Last Dose  . acetaminophen (TYLENOL) tablet 650 mg  650 mg Oral Q6H PRN Laverle Hobby, PA-C      . albuterol (PROVENTIL HFA;VENTOLIN HFA) 108 (90 BASE) MCG/ACT inhaler 2 puff  2 puff Inhalation Q4H PRN Laverle Hobby, PA-C      . alum & mag hydroxide-simeth (MAALOX/MYLANTA) 200-200-20 MG/5ML suspension 30 mL  30 mL Oral Q4H PRN Laverle Hobby, PA-C      . ARIPiprazole (ABILIFY) tablet 2 mg  2 mg Oral Daily Neita Garnet, MD   2 mg at 05/23/14 0827  . FLUoxetine (PROZAC) capsule 20 mg  20 mg Oral Daily Laverle Hobby, PA-C   20 mg at 05/23/14 0827  . gabapentin (NEURONTIN) capsule 200 mg  200 mg Oral BID Neita Garnet, MD      .  loratadine (CLARITIN) tablet 10 mg  10 mg Oral Daily Laverle Hobby, PA-C   10 mg at 05/22/14 0802  . LORazepam (ATIVAN) tablet 1 mg  1 mg Oral Q6H PRN Laverle Hobby, PA-C   1 mg at 05/22/14 1152  . magnesium hydroxide (MILK OF MAGNESIA) suspension 30 mL  30 mL Oral Daily PRN Laverle Hobby, PA-C      . mirtazapine (REMERON) tablet 15 mg  15 mg Oral QHS Neita Garnet, MD   15 mg at 05/22/14 2128  . montelukast (SINGULAIR) tablet 10 mg  10 mg Oral Daily Laverle Hobby, PA-C   10 mg at 05/22/14 5361    Lab Results:  Results for orders placed  during the hospital encounter of 05/21/14 (from the past 48 hour(s))  TSH     Status: None   Collection Time    05/22/14  6:30 AM      Result Value Ref Range   TSH 1.450  0.350 - 4.500 uIU/mL   Comment: Performed at [facility]  LIPID PANEL     Status: Abnormal   Collection Time    05/22/14  6:30 AM      Result Value Ref Range   Cholesterol 176  0 - 200 mg/dL   Triglycerides 79  <150 mg/dL   HDL 57  >39 mg/dL   Total CHOL/HDL Ratio 3.1     VLDL 16  0 - 40 mg/dL   LDL Cholesterol 103 (*) 0 - 99 mg/dL   Comment:            Total Cholesterol/HDL:CHD Risk     Coronary Heart Disease Risk Table                         Men   Women      1/2 Average Risk   3.4   3.3      Average Risk       5.0   4.4      2 X Average Risk   9.6   7.1      3 X Average Risk  23.4   11.0                Use the calculated Patient Ratio     above and the CHD Risk Table     to determine the patient's CHD Risk.                ATP III CLASSIFICATION (LDL):      <100     mg/dL   Optimal      100-129  mg/dL   Near or Above                        Optimal      130-159  mg/dL   Borderline      160-189  mg/dL   High      >190     mg/dL   Very High     Performed at [facility]     Status: None   Collection Time    05/22/14  6:30 AM      Result Value Ref Range   Hemoglobin A1C 5.4  <5.7 %   Comment: (NOTE)  According to the ADA Clinical Practice Recommendations for 2011, when     HbA1c is used as a screening test:      >=6.5%   Diagnostic of Diabetes Mellitus               (if abnormal result is confirmed)     5.7-6.4%   Increased risk of developing Diabetes Mellitus     References:Diagnosis and Classification of Diabetes Mellitus,Diabetes     XJDB,5208,02(MVVKP 1):S62-S69 and Standards of Medical Care in             Diabetes - 2011,Diabetes QAES,9753,00 (Suppl 1):S11-S61.   Mean Plasma Glucose 108   <117 mg/dL   Comment: Performed at [facility]    Physical Findings: AIMS: Facial and Oral Movements Muscles of Facial Expression: None, normal Lips and Perioral Area: None, normal Jaw: None, normal Tongue: None, normal,Extremity Movements Upper (arms, wrists, hands, fingers): None, normal Lower (legs, knees, ankles, toes): None, normal, Trunk Movements Neck, shoulders, hips: None, normal, Overall Severity Severity of abnormal movements (highest score from questions above): None, normal Incapacitation due to abnormal movements: None, normal Patient's awareness of abnormal movements (rate only patient's report): No Awareness, Dental Status Current problems with teeth and/or dentures?: Yes (grinding at night) Does patient usually wear dentures?: No  CIWA:    COWS:     Assessment: Patient reports some improvement compared to admission. She still presents  Isolative, fairly groomed, sad, and with a constricted, although briefly reactive, affect. At this time denies suicidal ideations and is tolerating medications well. Denies any symptoms of psychosis at present.  Treatment Plan Summary: Daily contact with patient to assess and evaluate symptoms and progress in treatment Medication management See below  Plan: Continue inpatient treatment/ milieu/support Encourage increased milieu/group participation Continue Abilify 2 mgrs a day Continue Prozac 20 mgrs a day Continue Remeron 15 mgrs QHS     Medical Decision Making Problem Points:  Established problem, stable/improving (1), Review of last therapy session (1) and Review of psycho-social stressors (1) Data Points:  Review or order clinical lab tests (1) Review of medication regiment & side effects (2)  I certify that inpatient services furnished can reasonably be expected to improve the patient's condition.   COBOS, [place] 05/23/2014, 11:06 AM

## 2014-05-23 NOTE — [facility] Group Notes (Addendum)
[facility] LCSW Group Therapy        Feelings Around Relapse        1:15-2:30 PM    05/23/2014 3:06 PM  Type of Therapy:  Group Therapy   Participation Level:  Did Not Attend - patient in bed.  Wynn BankerHodnett, Rosalea Withrow Hairston 05/23/2014, 3:06 PM

## 2014-05-23 NOTE — Progress Notes (Signed)
[facility] Group Notes:  (Nursing/MHT/Case Management/Adjunct)  Date:  05/23/2014  Time:  1:05 PM  Type of Therapy:  Therapeutic Activity  Participation Level:  Did not attend. Pt was in bed asleep.   Morgan [name], Morgan [name] 05/23/2014, 1:05 PM
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
## 2014-05-23 NOTE — Progress Notes (Signed)
Pt has been in bed most of the morning.  She did get up for her 0800 medications.  We discussed her self-inventory she rated both depression and hopelessness a 5 and anxiety a 7. Her goal today, "communicate well with my doctor" and she did c/o feeling sleepy. She plans to talk with the doctor about her medications.

## 2014-05-24 DIAGNOSIS — F332 Major depressive disorder, recurrent severe without psychotic features: Principal | ICD-10-CM

## 2014-05-24 NOTE — Progress Notes (Signed)
Patient ID: [name], female   DOB: November 23, 1990, 23 y.o.   MRN: [id number] D)  Came to the med window this evening before group, was tearful, anxious, stated was feeling upset with herself, has made a lot of mistakes this past year, upset with herself and was beginning to feel that she had no purpose, no reason to live.   A)  Was able to discuss finding her purpose, and using her life and her experiences to help others. Was able to see how she could turn a negative into something positive.  Was still having some anxiety, requested and given ativan.  Will continue to monitor for safety R)  When checked back, was feeling less anxious, feeling better, and able to contract for safety..  Didn't attend group this evening, but will encourage her to go tomorrow.  Remains safe on unit.

## 2014-05-24 NOTE — [facility] Group Notes (Signed)
[facility] Group Notes:  (Nursing/MHT/Case Management/Adjunct)  Date:  05/24/2014  Time:  1:55 PM  Type of Therapy:  Psychoeducational Skills  Participation Level:  Did Not Attend  Morgan [name] 05/24/2014, 1:55 PM 

## 2014-05-24 NOTE — Progress Notes (Signed)
Patient did not attend the evening speaker AA meeting. Pt was notified that group was beginning but remained in her room.   

## 2014-05-24 NOTE — Plan of Care (Signed)
Problem: Diagnosis: Increased Risk For Suicide Attempt Goal: STG-Patient Will Comply With Medication Regime Outcome: Progressing Client has been compliant with medications.

## 2014-05-24 NOTE — [facility] Group Notes (Addendum)
[facility] Group Notes: (Clinical Social Work)   05/24/2014      Type of Therapy:  Group Therapy   Participation Level:  Did Not Attend - refused   Demontray Franta Grossman-Orr, LCSW 05/24/2014, 1:34 PM     

## 2014-05-24 NOTE — Progress Notes (Signed)
Patient ID: Morgan ProfferJenna A Kaser, female   DOB: 1991/02/23, 23 y.o.   MRN: [id number]   D: Pt has been very flat and depressed on the unit today. Pt has also been very isolative and guarded. Pt has attended groups, however has had minimal participation. Pt reported that her depression was a 6, and her helplessness/hopelessness was a 7. Pt reported being negative SI/HI, no AH/VH noted. A: 15 min checks continued for patient safety. R: Pt safety maintained.

## 2014-05-24 NOTE — Progress Notes (Signed)
[facility]  05/24/2014 11:54 AM [name]  MRN:  [id number]  Subjective:   Patient in bed at 11:55, awakens easily to verbal stimulation.  Reports her Remeron is making her "tired".  Continues to have fleeting thoughts of hopelessness and the idea of taking an OD of pills crosses her mind, she contracts for safety     Rate depression 7/10 and Anxiety 8/10  Denies AVH  She expresses her hope to get better .  Her goal is to attend group today.       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 She appears well-developed and well-nourished.  HENT:  Head: Normocephalic.  Neck: Normal range of motion. Neck supple.  Musculoskeletal: Normal range of motion.  Neurological: She is alert and oriented to person, place, and time.  Skin: Skin is warm and dry.    ROS  Blood pressure 119/67, pulse 77, temperature 98.4 F (36.9 C), temperature source Oral, resp. rate 16, height 5' 2.99" (1.6 m), weight 98.431 kg (217 lb), last menstrual period 05/07/2014, SpO2 100.00%.Body mass index is 38.45 kg/(m^2).  General Appearance: Disheveled  Eye SolicitorContact::  Fair  Speech:  Slow  Volume:  Decreased  Mood:  Depressed  Affect:  Constricted-    Thought Process:  Linear  Orientation:  Other:  fully alert and attentive  Thought Content:  Denies AVH  Suicidal Thoughts: Yes contracts for safety on the unit, sometimes she thinks about taking pills or getting a gun to take her life  Homicidal Thoughts:  No  Memory:  intact  Judgement:  Fair  Insight:  Fair  Psychomotor Activity:  Decreased   Concentration:  Good  Recall:  Good  Fund of Knowledge:Good  Language: Good  Akathisia:  No  Handed:  Right  AIMS (if indicated):     Assets:  Desire for Improvement Resilience Social Support  Sleep:  Number of Hours: 6.75   Musculoskeletal: Strength & Muscle Tone: within normal limits Gait & Station: normal Patient leans: N/A  Current Medications: Current Facility-Administered Medications  Medication Dose Route Frequency Provider Last Rate Last Dose  . acetaminophen (TYLENOL) tablet 650 mg  650 mg Oral Q6H PRN Kerry HoughSpencer E Simon, PA-C      . albuterol (PROVENTIL HFA;VENTOLIN HFA) 108 (90 BASE) MCG/ACT inhaler 2 puff  2 puff Inhalation Q4H PRN Kerry HoughSpencer E Simon, PA-C      . alum & mag hydroxide-simeth (MAALOX/MYLANTA) 200-200-20 MG/5ML suspension 30 mL  30 mL Oral Q4H PRN Kerry HoughSpencer E Simon, PA-C      . ARIPiprazole (ABILIFY) tablet 2 mg  2 mg Oral Daily Nehemiah MassedFernando Cobos, MD   2 mg at 05/24/14 0955  . FLUoxetine (PROZAC) capsule 20 mg  20 mg Oral Daily Kerry HoughSpencer E Simon, PA-C   20 mg at 05/24/14 0955  . gabapentin (NEURONTIN) capsule 200 mg  200 mg Oral BID Nehemiah MassedFernando Cobos, MD      . loratadine (CLARITIN) tablet 10 mg  10 mg Oral Daily Kerry HoughSpencer E Simon, PA-C   10 mg at 05/24/14 0956  . LORazepam (ATIVAN) tablet 1 mg  1 mg Oral  Q6H PRN Kerry HoughSpencer E Simon, PA-C   1 mg at 05/23/14 2131  . magnesium hydroxide (MILK OF MAGNESIA) suspension 30 mL  30 mL Oral Daily PRN Kerry HoughSpencer E Simon, PA-C      . mirtazapine (REMERON) tablet 15 mg  15 mg Oral QHS Nehemiah MassedFernando Cobos, MD   15 mg at 05/23/14 2131  . montelukast (SINGULAIR) tablet 10 mg  10 mg Oral Daily Kerry HoughSpencer E Simon, PA-C   10 mg at 05/24/14 [id number]    Lab Results:  No results found for this or any previous visit (from the past 48 hour(s)).  Physical Findings: AIMS: Facial and Oral Movements Muscles of Facial Expression: None, normal Lips and Perioral Area: None, normal Jaw: None, normal Tongue: None, normal,Extremity Movements Upper (arms, wrists,  hands, fingers): None, normal Lower (legs, knees, ankles, toes): None, normal, Trunk Movements Neck, shoulders, hips: None, normal, Overall Severity Severity of abnormal movements (highest score from questions above): None, normal Incapacitation due to abnormal movements: None, normal Patient's awareness of abnormal movements (rate only patient's report): No Awareness, Dental Status Current problems with teeth and/or dentures?: Yes (grinding at night) Does patient usually wear dentures?: No  CIWA:  CIWA-Ar Total: 2 COWS:       Treatment Plan Summary:   Daily contact with patient to assess and evaluate symptoms and progress in treatment Medication management See below  Plan: Continue inpatient treatment Encourage group participation Continue Abilify 2 mgrs a day Continue Prozac 20 mgrs a day Continue Remeron 15 mgrs QHS  We discussed the power of positive thoughts and actions     Medical Decision Making Problem Points:  Established problem, stable/improving (1), Review of last therapy session (1) and Review of psycho-social stressors (1) Data Points:  Review or order clinical lab tests (1) Review of medication regiment & side effects (2)  I certify that inpatient services furnished can reasonably be expected to improve the patient's condition.   Lorinda CreedLARACH, [name] PMHNP 05/24/2014, 11:54 AM I agreed with findings and treatment plan of this patient

## 2014-05-25 MED ORDER — FLUOXETINE HCL 20 MG PO CAPS
40.0000 mg | ORAL_CAPSULE | Freq: Every day | ORAL | Status: DC
  Administered 2014-05-26: 40 mg via ORAL
  Filled 2014-05-25 (×3): qty 2

## 2014-05-25 NOTE — Progress Notes (Signed)
Patient did attend the evening speaker AA meeting.  

## 2014-05-25 NOTE — Progress Notes (Signed)
[facility]  05/25/2014 9:26 AM Galvin ProfferJenna A Sarris  MRN:  [id number]  Subjective:   Patient in bed at 0930, awakens easily to verbal stimulation.  Reports having " a really bad night".  Her depression "took her way down, had thoughts about not wanting to live.  Presently she denies SI and contracts for safety.   Denies AVH   Rates Depression 6/10 Anxiety 4/10.   Discussed increasing her Prozac, higher dose was more effective in the past.   Strongly encouraged her to attend group today.       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 She appears well-developed and well-nourished.  HENT:  Head: Normocephalic.  Neck: Normal range of motion. Neck supple.  Musculoskeletal: Normal range of motion.  Neurological: She is alert and oriented to person, place, and time.  Skin: Skin is warm and dry.    ROS  Blood pressure 118/70, pulse 74, temperature 98.4 F (36.9 C), temperature source Oral, resp. rate 16, height 5' 2.99" (1.6 m), weight 98.431 kg (217 lb), last menstrual period 05/07/2014, SpO2 100.00%.Body mass index is 38.45 kg/(m^2).  General Appearance: Disheveled  Eye SolicitorContact::  Fair  Speech:  Slow  Volume:  Decreased  Mood:  Depressed  Affect:  Constricted-    Thought Process:  Linear  Orientation:  Other:  fully alert and attentive  Thought Content:  Denies AVH  Suicidal thoughts: denies presently, contracts for safety  Homicidal Thoughts:  No  Memory:  intact  Judgement:  Fair  Insight:  Fair  Psychomotor Activity:  Decreased  Concentration:  Good  Recall:  Good  Fund of  Knowledge:Good  Language: Good  Akathisia:  No  Handed:  Right  AIMS (if indicated):     Assets:  Desire for Improvement Resilience Social Support  Sleep:  Number of Hours: 6   Musculoskeletal: Strength & Muscle Tone: within normal limits Gait & Station: normal Patient leans: N/A  Current Medications: Current Facility-Administered Medications  Medication Dose Route Frequency Provider Last Rate Last Dose  . acetaminophen (TYLENOL) tablet 650 mg  650 mg Oral Q6H PRN Kerry HoughSpencer E Simon, PA-C      . albuterol (PROVENTIL HFA;VENTOLIN HFA) 108 (90 BASE) MCG/ACT inhaler 2 puff  2 puff Inhalation Q4H PRN Kerry HoughSpencer E Simon, PA-C      . alum & mag hydroxide-simeth (MAALOX/MYLANTA) 200-200-20 MG/5ML suspension 30 mL  30 mL Oral Q4H PRN Kerry HoughSpencer E Simon, PA-C      . ARIPiprazole (ABILIFY) tablet 2 mg  2 mg Oral Daily Nehemiah MassedFernando Cobos, MD   2 mg at 05/25/14 0830  . FLUoxetine (PROZAC) capsule 20 mg  20 mg Oral Daily Kerry HoughSpencer E Simon, PA-C   20 mg at 05/25/14 [id number]  . gabapentin (NEURONTIN) capsule 200 mg  200 mg Oral BID Nehemiah MassedFernando Cobos, MD      . loratadine (CLARITIN) tablet 10 mg  10 mg Oral Daily Kerry HoughSpencer E Simon, PA-C   10 mg at 05/25/14 0827  . LORazepam (ATIVAN) tablet 1 mg  1 mg Oral Q6H PRN Kerry HoughSpencer E Simon, PA-C   1 mg at 05/24/14 1944  .  magnesium hydroxide (MILK OF MAGNESIA) suspension 30 mL  30 mL Oral Daily PRN Kerry HoughSpencer E Simon, PA-C      . mirtazapine (REMERON) tablet 15 mg  15 mg Oral QHS Nehemiah MassedFernando Cobos, MD   15 mg at 05/24/14 2131  . montelukast (SINGULAIR) tablet 10 mg  10 mg Oral Daily Kerry HoughSpencer E Simon, PA-C   10 mg at 05/25/14 [id number]    Lab Results:  No results found for this or any previous visit (from the past 48 hour(s)).  Physical Findings: AIMS: Facial and Oral Movements Muscles of Facial Expression: None, normal Lips and Perioral Area: None, normal Jaw: None, normal Tongue: None, normal,Extremity Movements Upper (arms, wrists, hands, fingers): None, normal Lower (legs, knees,  ankles, toes): None, normal, Trunk Movements Neck, shoulders, hips: None, normal, Overall Severity Severity of abnormal movements (highest score from questions above): None, normal Incapacitation due to abnormal movements: None, normal Patient's awareness of abnormal movements (rate only patient's report): No Awareness, Dental Status Current problems with teeth and/or dentures?: Yes (grinding at night) Does patient usually wear dentures?: No  CIWA:  CIWA-Ar Total: 2 COWS:       Treatment Plan Summary:   Daily contact with patient to assess and evaluate symptoms and progress in treatment Medication management See below  Plan: Continue inpatient treatment Encourage group participation Continue Abilify 2 mgrs a day -Increase  Prozac to  40 mgs a day Continue Remeron 15 mgrs QHS O/C of side effects  We discussed the power of positive thoughts and actions     Medical Decision Making Problem Points:  Established problem, stable/improving (1), Review of last therapy session (1) and Review of psycho-social stressors (1) Data Points:  Review or order clinical lab tests (1) Review of medication regiment & side effects (2)  I certify that inpatient services furnished can reasonably be expected to improve the patient's condition.   Lorinda CreedLARACH, [name] PMHNP 05/25/2014, 9:26 AM I agreed with findings and treatment plan of this patient

## 2014-05-25 NOTE — [facility] Group Notes (Signed)
[facility] Group Notes:  (Clinical Social Work)  05/25/2014  10:00-11:00AM  Summary of Progress/Problems:   The main focus of today's process group was to   1)  discuss the importance of adding supports  2)  define health supports versus unhealthy supports  3)  identify the patient's current unhealthy supports and plan how to handle them  4)  Identify the patient's current healthy supports and plan what to add.  An emphasis was placed on using counselor, doctor, therapy groups, 12-step groups, and problem-specific support groups to expand supports.    The patient expressed full comprehension of the concepts presented, and agreed that there is a need to add more supports.  The patient stated her boyfriend and mother are her healthy supports currently.  She feels that she herself is her most unhealthy support, because she engages in a great deal of negative self-talk.  She indicated a willingness to utilize her healthy supports as accountability partners to help her work on her unhealthy coping skill so that she can become a better self-support.  Type of Therapy:  Process Group with Motivational Interviewing  Participation Level:  Active  Participation Quality:  Attentive and Sharing  Affect:  Depressed and Flat  Cognitive:  Alert and Appropriate  Insight:  Engaged  Engagement in Therapy:  Engaged  Modes of Intervention:   Education, Support and Processing, Activity  Pilgrim's PrideMareida Grossman-Orr, LCSW 05/25/2014, 12:15pm

## 2014-05-25 NOTE — Progress Notes (Signed)
D: Patient is alert and oriented. Pt's mood is depressed and affect is sullen/sad. Pt reports depression, hopelessness, and anxiety 7/10. Pt states her goal for the day is to "get out of bed." Pt denies SI/HI and AVH. Pt observed laying in bed most of the day. Pt reports feeling "tired." A: Medications administered per providers orders (See MAR). Pt encouraged to attend groups today. 15 minute checks completed per protocol for pt safety. R: Pt cooperative and receptive to nursing interventions.

## 2014-05-26 DIAGNOSIS — F3130 Bipolar disorder, current episode depressed, mild or moderate severity, unspecified: Secondary | ICD-10-CM

## 2014-05-26 DIAGNOSIS — F4310 Post-traumatic stress disorder, unspecified: Secondary | ICD-10-CM

## 2014-05-26 DIAGNOSIS — F902 Attention-deficit hyperactivity disorder, combined type: Secondary | ICD-10-CM

## 2014-05-26 MED ORDER — LORATADINE 10 MG PO TABS: 10.0000 mg | ORAL_TABLET | Freq: Every day | ORAL | Status: AC

## 2014-05-26 MED ORDER — MIRTAZAPINE 15 MG PO TABS: 15.0000 mg | ORAL_TABLET | Freq: Every day | ORAL | Status: DC

## 2014-05-26 MED ORDER — MONTELUKAST SODIUM 10 MG PO TABS: 10.0000 mg | ORAL_TABLET | Freq: Every day | ORAL | Status: AC

## 2014-05-26 MED ORDER — FLUOXETINE HCL 40 MG PO CAPS
40.0000 mg | ORAL_CAPSULE | Freq: Every day | ORAL | Status: DC
Start: 2014-05-26 — End: 2014-11-19

## 2014-05-26 MED ORDER — ARIPIPRAZOLE 2 MG PO TABS: 2.0000 mg | ORAL_TABLET | Freq: Every day | ORAL | Status: DC

## 2014-05-26 MED ORDER — ALBUTEROL SULFATE HFA 108 (90 BASE) MCG/ACT IN AERS: 2.0000 | INHALATION_SPRAY | RESPIRATORY_TRACT | Status: DC | PRN

## 2014-05-26 MED ORDER — GABAPENTIN 100 MG PO CAPS: 200.0000 mg | ORAL_CAPSULE | Freq: Two times a day (BID) | ORAL | Status: DC

## 2014-05-26 NOTE — Progress Notes (Signed)
Patient ID: Morgan ProfferJenna A Vasudevan, female   DOB: August 11, 1990, 23 y.o.   MRN: [id number] D)  Has been more active this evening and has been in the dayroom.  Stated she had a visit today from her father, had been a surprise, and he had been supportive of her being here.  Attended AA group this evening and stayed through the meeting, feels she really wants to work on being compliant and successful.  Denies thoughts of self harm this evening, remains rather quiet and reserved, but pleasant.  Also stated she would like to go back to school, is trying to set goals for future. A)  Will continue to monitor for safety, support and encouragement given, continue POC R)  Appreciative, safety maintained.

## 2014-05-26 NOTE — Progress Notes (Signed)
[facility] Discharge [facility] :  Will you be returning to the same living situation after discharge: Yes,  patient will return home At discharge, do you have transportation home?:Yes,  patient reports that her boyfriend can provide transportation Do you have the ability to pay for your medications:Yes,  patient will be provided with medication samples and prescriptions at discharge.  Release of information consent forms completed and in the chart;  Patient's signature needed at discharge.  Patient to Follow up at: Follow-up Information   [facility] ([facility]-Friday from 8am to 3 pm for hospital follow up appointment.)    [facility]   Contact information:   [address] [place] [address] [phone]       Patient denies SI/HI:   Yes, denies    Safety Planning and Suicide Prevention discussed: yes, with patient and boyfriend  Shameria Trimarco, [name] 05/26/2014, 10:59 AM

## 2014-05-26 NOTE — Progress Notes (Signed)
Adult Psychoeducational Group Note  Date:  05/26/2014 Time:  10:00am Group Topic/Focus:  Wellness Toolbox:   The focus of this group is to discuss various aspects of wellness, balancing those aspects and exploring ways to increase the ability to experience wellness.  Patients will create a wellness toolbox for use upon discharge.  Participation Level:  Active  Participation Quality:  Appropriate and Attentive  Affect:  Appropriate  Cognitive:  Alert and Appropriate  Insight: Appropriate  Engagement in Group:  Engaged  Modes of Intervention:  Discussion and Education  Additional Comments:  Pt attended and participated in group. Question was asked What is you good and bad quality? Pt stated her good quality is she is a caring person and her bad quality is a perfectionist.  [name], Morgan [name] 05/26/2014, 10:38 AM

## 2014-05-26 NOTE — [facility] Suicide Risk Assessment (Signed)
Demographic Factors:  23 year old single female, no children.   Total Time spent with patient: 30 minutes  Psychiatric Specialty Exam: Physical Exam  ROS  Blood pressure 122/76, pulse 72, temperature 98.3 F (36.8 C), temperature source Oral, resp. rate 16, height 5' 2.99" (1.6 m), weight 98.431 kg (217 lb), last menstrual period 05/07/2014, SpO2 100.00%.Body mass index is 38.45 kg/(m^2).  General Appearance: Well Groomed  Patent attorneyye Contact::  Good  Speech:  Normal Rate  Volume:  Normal  Mood:  improved and today denies depression and seems euthymic  Affect:  Appropriate and full in range   Thought Process:  Goal Directed and Linear  Orientation:  Full (Time, Place, and Person)  Thought Content:  denies hallucinations, no delusions  Suicidal Thoughts:  No  Homicidal Thoughts:  No  Memory:  recent and remote grossly intact   Judgement:  Fair  Insight:  Fair  Psychomotor Activity:  Normal  Concentration:  Good  Recall:  Good  Fund of Knowledge:Good  Language: Good  Akathisia:  Negative  Handed:  Right  AIMS (if indicated):     Assets:  Desire for Improvement Physical Health Resilience Social Support  Sleep:  Number of Hours: 5.25    Musculoskeletal: Strength & Muscle Tone: within normal limits Gait & Station: normal Patient leans: N/A   Mental Status Per Nursing Assessment::   On Admission:  Suicidal ideation indicated by patient;Suicide plan;Plan includes specific time, place, or method;Self-harm thoughts;Intention to act on suicide plan  Current Mental Status by Physician: As noted, at this time patient is much improved compared to admission- improved grooming, improved eye contact, improved rate of speech, no thought disorder, today seems euthymic, with a full range of affect, not suicidal or homicidal , future oriented.  Loss Factors: Unemployment, limited support network other than boyfriend  Historical Factors: History of Depression, History of self cutting  years ago, not recently, history of overdose.   Risk Reduction Factors:   Living with another person, especially a relative, Positive social support and Positive coping skills or problem solving skills  Continued Clinical Symptoms:  At this time patient is improved, no longer feeling depressed, affect fuller in range, no thought disorder, no suicidal or homicidal ideations, no psychotic symptoms.   Cognitive Features That Contribute To Risk:  Alert and attentive , oriented x 3. Insight improved compared to admission  Suicide Risk:  Mild:  Suicidal ideation of limited frequency, intensity, duration, and specificity.  There are no identifiable plans, no associated intent, mild dysphoria and related symptoms, good self-control (both objective and subjective assessment), few other risk factors, and identifiable protective factors, including available and accessible social support.  Discharge Diagnoses:   AXIS I:  MDD Severe without Psychotic Symptoms, Cannabis Dependence   AXIS II:  Deferred AXIS III:   Past Medical History  Diagnosis Date  . Eczema   . TMJ (dislocation of temporomandibular joint)   . Depression   . ADHD (attention deficit hyperactivity disorder)   . PTSD (post-traumatic stress disorder)   . Anxiety   . Asthma    AXIS IV:  Unemployed, limited support network AXIS V:  60-65 at this time   Plan Of Care/Follow-up recommendations:  Activity:  As tolerated Diet:  Regular Tests:  NA Other:  See below  Is patient on multiple antipsychotic therapies at discharge:  No   Has Patient had three or more failed trials of antipsychotic monotherapy by history:  No  Recommended Plan for Multiple Antipsychotic Therapies: NA  Patient is leaving in good spirits. She plans to return home. She plans to follow up as  Follows- [facility] ([facility] Monday-Friday from 8am to 3 pm for hospital follow up appointment.)  Specialty:  Behavioral Health  States she goes to a local Urgent Care for medical treatment as needed.    COBOS, FERNANDO 05/26/2014, 12:39 PM

## 2014-05-26 NOTE — Progress Notes (Signed)
Patient ID: [name], female   DOB: May 01, 1991, 23 y.o.   MRN: [id number] She has been discharged and was picked up by her boyfriend. She voiced understanding of discharge teaching about follow up care and about medications. She denies thoughts of SI and all her belongs were taken home with her.

## 2014-05-26 NOTE — [facility] Group Notes (Signed)
   [facility]  05/26/2014  8:45 AM   Participation Quality: Alert, Appropriate and Oriented  Mood/Affect: Appropriate  Depression Rating: 3  Anxiety Rating: 8  Thoughts of Suicide: Pt denies SI/HI  Will you contract for safety? Yes  Current AVH: Pt denies  Plan for Discharge/Comments: Pt attended discharge planning group and actively participated in group. CSW provided pt with today's workbook. Patient reports that she is "feeling better, ready to discharge" today. [facility].  Transportation Means: Pt reports access to transportation  Supports: Patient's boyfriend has been identified as a strong support.  Samuella BruinKristin Robecca Fulgham, MSW, [employer] IncLCSWA Clinical Social Worker [facility] [phone]

## 2014-05-29 NOTE — Discharge Summary (Signed)
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 Psychiatric: She has a normal mood and affect. Her speech is normal and behavior is normal. Judgment and thought content normal. Cognition and memory are normal.    Review of Systems  Constitutional: Negative.   HENT: Negative.   Eyes: Negative.   Respiratory: Negative.   Cardiovascular: Negative.   Gastrointestinal: Negative.   Genitourinary: Negative.   Musculoskeletal: Negative.   Skin: Negative.   Neurological: Negative.   Endo/Heme/Allergies: Negative.   Psychiatric/Behavioral: Positive for depression (Hx of, chronic, stabilized). Negative for suicidal ideas, hallucinations, memory loss and substance abuse. The patient is not nervous/anxious and does not have insomnia.     Blood pressure 122/76, pulse 72, temperature 98.3 F (36.8 C), temperature source Oral, resp. rate 16, height 5' 2.99" (1.6 m), weight 98.431 kg (217 lb), last menstrual period 05/07/2014, SpO2 100.00%.Body mass index is 38.45 kg/(m^2).   Past Psychiatric History:  Diagnosis: Patient Has been diagnosed with Bipolar Disorder, but is endorsing symptoms and history of repeated and severe depression with only one isolated episode of hypomania, occuring during cocaine use   Hospitalizations: Several recent psychiatric admissions ( 4 this year) , [facility] ( from 9/02-06-14)   Outpatient Care: None at  this time- had been [facility]   Substance Abuse Care: None currently   Self-Mutilation: (+) self cutting as a teenager, not in many yeas   Suicidal Attempts:Overdose in January 2015   Violent Behaviors: Denies    Musculoskeletal: Strength & Muscle Tone: within normal limits Gait & Station: normal Patient leans: N/A  DSM5:  Schizophrenia Disorders:  NA Obsessive-Compulsive Disorders:  NA Trauma-Stressor Disorders:  Substance/Addictive Disorders:  NA Depressive Disorders:  MDD (major depressive disorder), recurrent severe, without psychosis  Axis Diagnosis:   AXIS I:  ADHD, combined type, Bipolar, Depressed and Post Traumatic Stress Disorder AXIS II:  Deferred AXIS III:   Past Medical History  Diagnosis Date  . Eczema   . TMJ (dislocation of temporomandibular joint)   . Depression   . ADHD (attention deficit hyperactivity disorder)   . PTSD (post-traumatic stress disorder)   . Anxiety   . Asthma    AXIS IV:  economic problems, occupational problems and other psychosocial or environmental problems AXIS V:  61-70 mild symptoms  Level of Care:  OP  Hospital Course:   Morgan [name] is 23 year old female, who presented to the ED.  Reported depression. She presented quite depressed, with constricted affect and obvious sadness.  She stated that her " medications seem to have stopped working" (was taking Neurontin, Celexa. One week ago she was switched from Celexa to Prozac).  Patient has had recent psychiatric admissions - was in our unit in early September and soon after her discharge from here was admitted to another hospital [facility]([place] from where she was just recently discharged.  She states that she is supposed to [facility], but has not gone "  because I've been in the hospital".  Patient endorsed significant neuro-vegetative symptoms of depression and suicidal ideations (overdose).  At time of admission, she denied plan or intention of hurting herself and contracts  for safety on the unit.  She denied psychotic symptoms.  Morgan [name] was [facility] for crisis [facility].  Plan of care included medication management.  She was placed on a trial of FLUoxetine (PROZAC) 40 MG  And reported no adverse S/E's.  Additionally, she was given mirtazapine (REMERON) 15 MG tablet.  Gabapentin (NEURONTIN) 100 MG BID, as patient requested to be tapered down from TID, dose kept the same.  [facility] were [facility] and prescriptions handed to patient as well.  Patient was strongly encouraged her to attend group milieu therapy.  She participated and was engaged in some group discussion.  Her interaction with other patients and with nursing staff was pleasant and she did not exhibit disruptive behavior on the unit.    Patient is leaving in good spirits.  She is returning home and will follow [facility].   She states having her boyfriend as a support is helpful to her.  She verbalized understanding of discharge instructions per nursing staff.  Consults:  psychiatry  Significant Diagnostic Studies:  labs: Per ED  Discharge Vitals:   Blood pressure 122/76, pulse 72, temperature 98.3 F (36.8 C), temperature source Oral, resp. rate 16, height 5' 2.99" (1.6 m), weight 98.431 kg (217 lb), last menstrual period 05/07/2014, SpO2 100.00%. Body mass index is 38.45 kg/(m^2). Lab Results:   No results found for this or any previous visit (from the past 72 hour(s)).  Physical Findings: AIMS: Facial and Oral Movements Muscles of Facial Expression: None, normal Lips and Perioral Area: None, normal Jaw: None, normal Tongue: None, normal,Extremity Movements Upper (arms, wrists, hands, fingers): None, normal Lower (legs, knees, ankles, toes): None, normal, Trunk Movements Neck, shoulders, hips: None, normal, Overall Severity Severity of abnormal movements (highest score from questions above): None, normal Incapacitation due to abnormal movements: None, normal Patient's  awareness of abnormal movements (rate only patient's report): No Awareness, Dental Status Current problems with teeth and/or dentures?: Yes (grinding at night) Does patient usually wear dentures?: No  CIWA:  CIWA-Ar Total: 2 COWS:     Psychiatric Specialty Exam: See Psychiatric Specialty Exam and Suicide Risk Assessment completed by Attending Physician prior to discharge.  Discharge destination:  Home  Is patient on multiple antipsychotic therapies at discharge:  No   Has Patient had three or more failed trials of antipsychotic monotherapy by history:  No  Recommended Plan for Multiple Antipsychotic Therapies: NA     Medication List    STOP taking these medications       cetirizine 10 MG tablet  Commonly known as:  ZYRTEC  Replaced by:  loratadine 10 MG tablet     diphenhydrAMINE 25 mg capsule  Commonly known as:  BENADRYL     LORazepam 1 MG tablet  Commonly known as:  ATIVAN     pramipexole 0.25 MG tablet  Commonly known as:  MIRAPEX      TAKE these medications     Indication   albuterol 108 (90 BASE) MCG/ACT inhaler  Commonly known as:  VENTOLIN HFA  Inhale 2 puffs into the lungs every 4 (four) hours as needed for wheezing or shortness of breath.   Indication:  Asthma     ARIPiprazole 2 MG tablet  Commonly known as:  ABILIFY  Take 1 tablet (2 mg  total) by mouth daily. For Depression, mood stabilization   Indication:  Major Depressive Disorder, Mood stabilization     FLUoxetine 40 MG capsule  Commonly known as:  PROZAC  Take 1 capsule (40 mg total) by mouth daily.   Indication:  Major Depressive Disorder     gabapentin 100 MG capsule  Commonly known as:  NEURONTIN  Take 2 capsules (200 mg total) by mouth 2 (two) times daily.   Indication:  Agitation, Fibromyalgia Syndrome, Pain     loratadine 10 MG tablet  Commonly known as:  CLARITIN  Take 1 tablet (10 mg total) by mouth daily.   Indication:  Hayfever     mirtazapine 15 MG tablet  Commonly known as:   REMERON  Take 1 tablet (15 mg total) by mouth at bedtime.   Indication:  Trouble Sleeping, Major Depressive Disorder     montelukast 10 MG tablet  Commonly known as:  SINGULAIR  Take 1 tablet (10 mg total) by mouth daily.   Indication:  Asthma           Follow-up Information   Follow up with [facility] (Please present to [facility] walk-in clinic Monday-Friday from 8am to 3 pm for hospital follow up appointment.)    Specialty:  [facility] information:   [address] [place] [address] [phone]       Follow-up recommendations:  Activity:  As tolerated Diet:  As tolerated  Comments:  1.  Take all your medications as prescribed.              2.  Report any adverse side effects to outpatient provider.                       3.  Patient instructed to not use alcohol or illegal drugs while on prescription medicines.            4.  In the event of worsening symptoms, instructed patient to call 911, the crisis hotline or go to nearest emergency room for evaluation of symptoms.  Total Discharge Time:  Greater than 30 minutes.  SignedAdonis Brook MAY, AGNP-BC 05/29/2014, 5:13 PM   Patient seen, Suicide Assessment Completed.  Disposition Plan Reviewed

## 2014-05-29 NOTE — Progress Notes (Signed)
Patient Discharge Instructions:  After Visit Summary (AVS):   Faxed to:  05/29/14 Discharge Summary Note:   Faxed to:  05/29/14 Psychiatric Admission Assessment Note:   Faxed to:  05/29/14 Suicide Risk Assessment - Discharge Assessment:   Faxed to:  05/29/14 Faxed/Sent to the Next Level Care provider:  05/29/14 Faxed to [facility] @ [phone]  Jerelene ReddenSheena E Tatums, 05/29/2014, 2:35 PM

## 2014-11-19 ENCOUNTER — Ambulatory Visit (INDEPENDENT_AMBULATORY_CARE_PROVIDER_SITE_OTHER): Payer: Managed Care, Other (non HMO) | Admitting: Family Medicine

## 2014-11-19 VITALS — BP 148/90 | HR 73 | Temp 97.9°F | Resp 16 | Ht 66.0 in | Wt 227.0 lb

## 2014-11-19 DIAGNOSIS — F4323 Adjustment disorder with mixed anxiety and depressed mood: Secondary | ICD-10-CM | POA: Diagnosis not present

## 2014-11-19 DIAGNOSIS — F329 Major depressive disorder, single episode, unspecified: Secondary | ICD-10-CM

## 2014-11-19 DIAGNOSIS — J4520 Mild intermittent asthma, uncomplicated: Secondary | ICD-10-CM | POA: Diagnosis not present

## 2014-11-19 DIAGNOSIS — F32A Depression, unspecified: Secondary | ICD-10-CM

## 2014-11-19 MED ORDER — ALBUTEROL SULFATE HFA 108 (90 BASE) MCG/ACT IN AERS: 2.0000 | INHALATION_SPRAY | RESPIRATORY_TRACT | Status: AC | PRN

## 2014-11-19 MED ORDER — FLUOXETINE HCL 40 MG PO CAPS: 40.0000 mg | ORAL_CAPSULE | Freq: Every day | ORAL | Status: AC

## 2014-11-19 MED ORDER — ALPRAZOLAM 0.25 MG PO TABS: 0.2500 mg | ORAL_TABLET | Freq: Two times a day (BID) | ORAL | Status: AC | PRN

## 2014-11-19 NOTE — Patient Instructions (Signed)
Next visit is due in 3 months

## 2014-11-19 NOTE — Progress Notes (Signed)
Patient ID: [name], female   DOB: 03/17/1991, 24 y.o.   MRN: [id number]   This chart was scribed for [name], MD by [facility], medical scribe at [facility].The patient was seen in exam room 04 and the patient's care was started at 5:51 PM.  Patient ID: LUNABELLA BADGETT MRN: [id number], DOB: 07/12/1991, 23 y.o. Date of Encounter: 11/19/2014  Primary Physician: [name], MD  Chief Complaint:  Chief Complaint  Patient presents with   Medication Refill    Prozac    HPI:  KATURAH KARAPETIAN is a 24 y.o. female who presents to [facility] for a medication refill. She would like a refill for her Prozac and albuterol. She would also like medication for her anxiety.   Currently taking Prozac which has been helpful. She is currently going to [place]. Sleeping has improved but is having a lot of anxiety. Still has pain due to the fibromyalgia. She recently stopped seeing her counselor but will return now she has insurance. Her breathing has been ok, but has not needed to take her inhaler.  Pt has been working at [employer] since January 2016. She lives in [place].  Past Medical History  Diagnosis Date   Eczema    TMJ (dislocation of temporomandibular joint)    Depression    ADHD (attention deficit hyperactivity disorder)    PTSD (post-traumatic stress disorder)    Anxiety    Asthma     Home Meds: Prior to Admission medications   Medication Sig Start Date End Date Taking? Authorizing Provider  FLUoxetine (PROZAC) 40 MG capsule Take 1 capsule (40 mg total) by mouth daily. 05/26/14  Yes Adonis Brook, NP  loratadine (CLARITIN) 10 MG tablet Take 1 tablet (10 mg total) by mouth daily. 05/26/14  Yes Adonis Brook, NP  Multiple Vitamins-Minerals (MULTIVITAMIN WITH MINERALS) tablet Take 1 tablet by mouth daily.   Yes Historical Provider, MD  montelukast (SINGULAIR) 10 MG tablet Take 1 tablet (10 mg total) by mouth  daily. Patient not taking: Reported on 11/19/2014 05/26/14   Adonis Brook, NP    Allergies:  Allergies  Allergen Reactions   Food Anaphylaxis    Melons: any type in the melon family with cause anaphylaxis    Peanut-Containing Drug Products Anaphylaxis   Soy Allergy Anaphylaxis   Geodon [Ziprasidone Hcl] Other (See Comments)    Hypersomnolence. All atypical antipsychotics.      Lactose Intolerance (Gi) Other (See Comments)    Reaction: GI upset   Vitamin C Itching    Most food which have vitamin c causes itching   Sulfa Antibiotics Rash    Childhood allergy    History   Social History   Marital Status: Single    Spouse Name: N/A   Number of Children: N/A   Years of Education: N/A   Occupational History   Not on file.   Social History Main Topics   Smoking status: Former Smoker -- 0.50 packs/day for 2 years    Types: Cigarettes   Smokeless tobacco: Current User   Alcohol Use: No     Comment: social / twice a month   Drug Use: Yes    Special: Marijuana, Cocaine     Comment: last used 19 days ago, recently started using cocaine   Sexual Activity: Yes    Birth Control/ Protection: Inserts   Other Topics Concern   Not on file   Social History Narrative  Review of Systems: Constitutional: negative for chills, fever, night sweats, weight changes, or fatigue  HEENT: negative for vision changes, hearing loss, congestion, rhinorrhea, ST, epistaxis, or sinus pressure Cardiovascular: negative for chest pain or palpitations Respiratory: negative for hemoptysis, wheezing, shortness of breath, or cough Abdominal: negative for abdominal pain, nausea, vomiting, diarrhea, or constipation Dermatological: negative for rash Neurologic: negative for headache, dizziness, or syncope All other systems reviewed and are otherwise negative with the exception to those above and in the HPI.  Physical Exam: Blood pressure 148/90, pulse 73, temperature 97.9 F (36.6  C), temperature source Oral, resp. rate 16, height 5\' 6"  (1.676 m), weight 227 lb (102.967 kg), last menstrual period 11/12/2014, SpO2 99 %., Body mass index is 36.66 kg/(m^2). General: Well developed, well nourished, in no acute distress. Head: Normocephalic, atraumatic, eyes without discharge, sclera non-icteric, nares are without discharge. Bilateral auditory canals clear, TM's are without perforation, pearly grey and translucent with reflective cone of light bilaterally. Oral cavity moist, posterior pharynx without exudate, erythema, peritonsillar abscess, or post nasal drip.  Neck: Supple. No thyromegaly. Full ROM. No lymphadenopathy. Lungs: Clear bilaterally to auscultation without wheezes, rales, or rhonchi. Breathing is unlabored. Heart: RRR with S1 S2. No murmurs, rubs, or gallops appreciated. Abdomen: Soft, non-tender, non-distended with normoactive bowel sounds. No hepatomegaly. No rebound/guarding. No obvious abdominal masses. Msk:  Strength and tone normal for age. Extremities/Skin: Warm and dry. No clubbing or cyanosis. No edema. No rashes or suspicious lesions. Neuro: Alert and oriented X 3. Moves all extremities spontaneously. Gait is normal. CNII-XII grossly in tact. Psych:  Responds to questions appropriately with a normal affect.    ASSESSMENT AND PLAN:  24 y.o. year old female with  This chart was scribed in my presence and reviewed by me personally.    ICD-9-CM ICD-10-CM   1. Adjustment disorder with mixed anxiety and depressed mood 309.28 F43.23 FLUoxetine (PROZAC) 40 MG capsule     ALPRAZolam (XANAX) 0.25 MG tablet  2. Depression 311 F32.9 FLUoxetine (PROZAC) 40 MG capsule  3. Asthma, chronic, mild intermittent, uncomplicated 493.90 J45.20 albuterol (VENTOLIN HFA) 108 (90 BASE) MCG/ACT inhaler     Signed, Elvina SidleKurt Lauenstein, MD  Signed, Elvina SidleKurt Lauenstein, MD 11/19/2014 5:51 PM

## 2015-10-26 IMAGING — CR DG CHEST 2V
2 series · 2 of 2 positions shown · non-contrast
Comparison: 10/09/2013.

CLINICAL DATA: Heart palpitations and shortness of Breath.

EXAM:
CHEST  2 VIEW

[w chest pa]
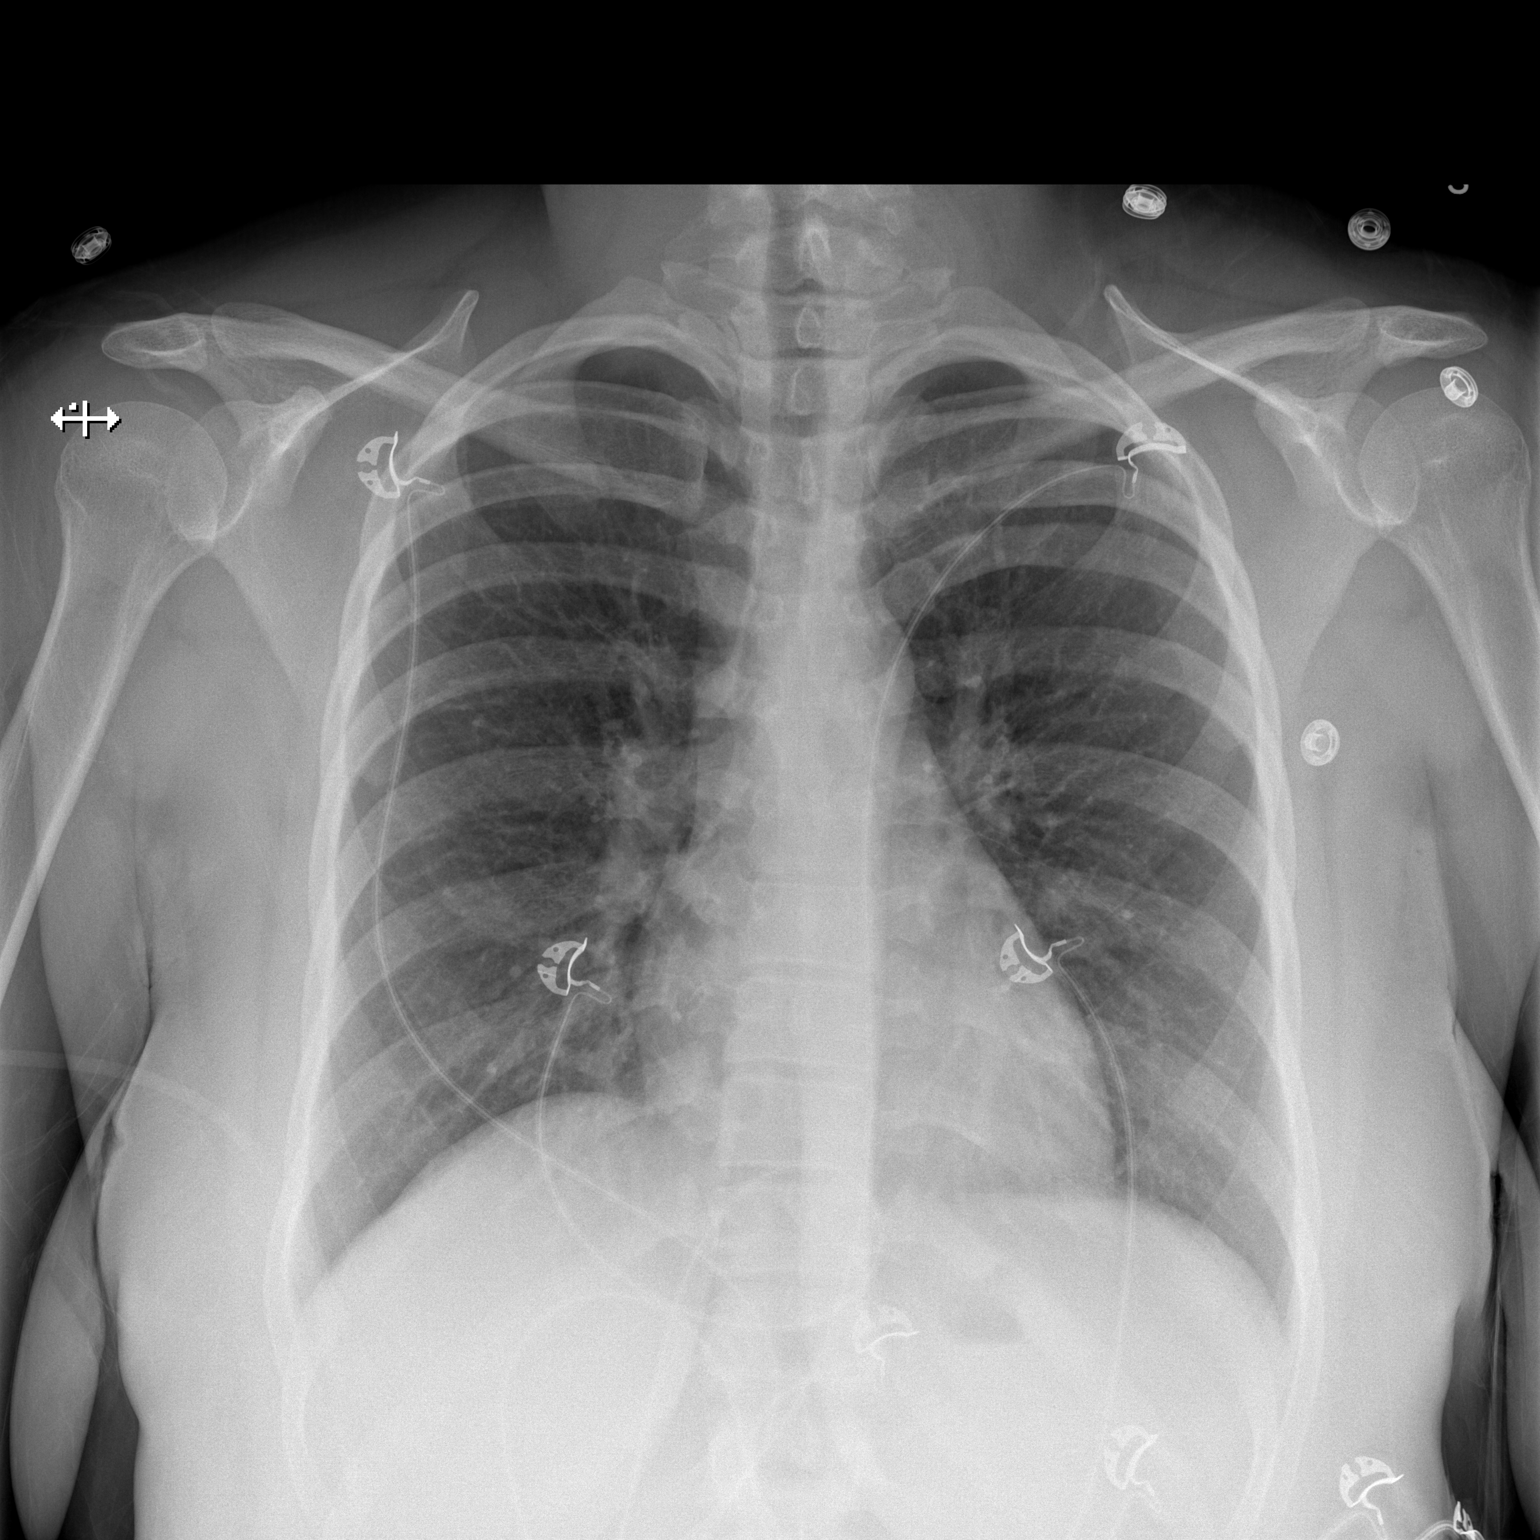

[w chest lat]
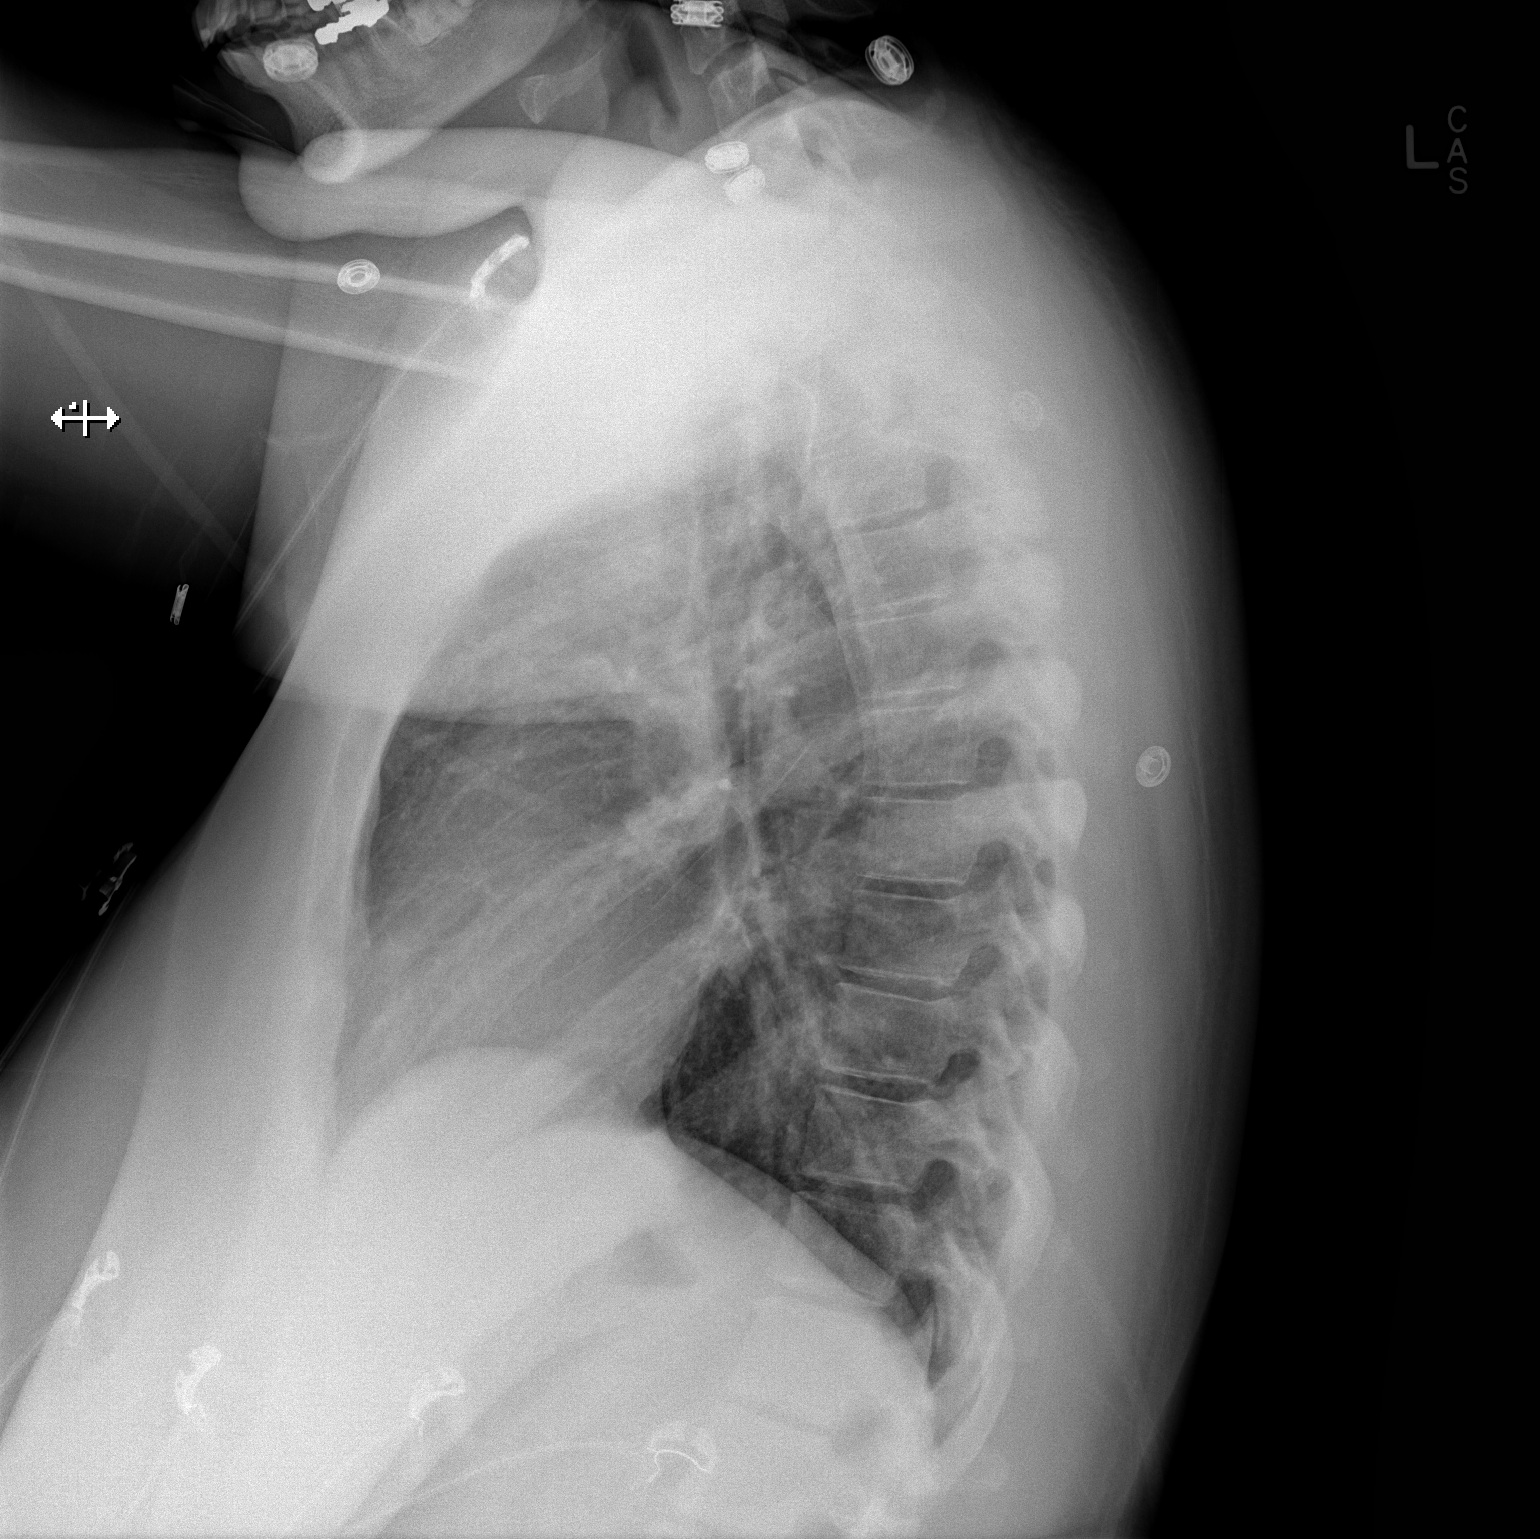

[2 of 2 positions shown; findings below may reference images not displayed]

FINDINGS: The heart size and mediastinal contours are within normal limits.
Both lungs are clear. The visualized skeletal structures are
unremarkable.
IMPRESSION: No active cardiopulmonary disease.

## 2016-05-20 ENCOUNTER — Emergency Department (HOSPITAL_COMMUNITY)
Admission: EM | Admit: 2016-05-20 | Discharge: 2016-05-20 | Disposition: A | Discharge status: Home / Self Care | Payer: Self-pay | Attending: Emergency Medicine | Admitting: Emergency Medicine

## 2016-05-20 ENCOUNTER — Encounter (HOSPITAL_COMMUNITY): Payer: Self-pay

## 2016-05-20 DIAGNOSIS — Z79899 Other long term (current) drug therapy: Secondary | ICD-10-CM | POA: Insufficient documentation

## 2016-05-20 DIAGNOSIS — Z91010 Allergy to peanuts: Secondary | ICD-10-CM | POA: Insufficient documentation

## 2016-05-20 DIAGNOSIS — R1032 Left lower quadrant pain: Secondary | ICD-10-CM | POA: Insufficient documentation

## 2016-05-20 DIAGNOSIS — F909 Attention-deficit hyperactivity disorder, unspecified type: Secondary | ICD-10-CM | POA: Insufficient documentation

## 2016-05-20 DIAGNOSIS — J45909 Unspecified asthma, uncomplicated: Secondary | ICD-10-CM | POA: Insufficient documentation

## 2016-05-20 DIAGNOSIS — R1030 Lower abdominal pain, unspecified: Secondary | ICD-10-CM

## 2016-05-20 DIAGNOSIS — Z87891 Personal history of nicotine dependence: Secondary | ICD-10-CM | POA: Insufficient documentation

## 2016-05-20 LAB — I-STAT BETA HCG BLOOD, ED (MC, WL, AP ONLY)

## 2016-05-20 LAB — URINE MICROSCOPIC-ADD ON

## 2016-05-20 LAB — CBC
HEMATOCRIT: 41.9 % (ref 36.0–46.0)
HEMOGLOBIN: 14.1 g/dL (ref 12.0–15.0)
MCH: 27.9 pg (ref 26.0–34.0)
MCHC: 33.7 g/dL (ref 30.0–36.0)
MCV: 83 fL (ref 78.0–100.0)
Platelets: 280 10*3/uL (ref 150–400)
RBC: 5.05 MIL/uL (ref 3.87–5.11)
RDW: 15.3 % (ref 11.5–15.5)
WBC: 6.7 10*3/uL (ref 4.0–10.5)

## 2016-05-20 LAB — COMPREHENSIVE METABOLIC PANEL
ALK PHOS: 68 U/L (ref 38–126)
ALT: 13 U/L — ABNORMAL LOW (ref 14–54)
ANION GAP: 7 (ref 5–15)
AST: 18 U/L (ref 15–41)
Albumin: 4.3 g/dL (ref 3.5–5.0)
BILIRUBIN TOTAL: 0.9 mg/dL (ref 0.3–1.2)
BUN: 11 mg/dL (ref 6–20)
CO2: 25 mmol/L (ref 22–32)
Calcium: 9.5 mg/dL (ref 8.9–10.3)
Chloride: 107 mmol/L (ref 101–111)
Creatinine, Ser: 0.68 mg/dL (ref 0.44–1.00)
Glucose, Bld: 98 mg/dL (ref 65–99)
POTASSIUM: 3.7 mmol/L (ref 3.5–5.1)
Sodium: 139 mmol/L (ref 135–145)
TOTAL PROTEIN: 8 g/dL (ref 6.5–8.1)

## 2016-05-20 LAB — URINALYSIS, ROUTINE W REFLEX MICROSCOPIC
Bilirubin Urine: NEGATIVE
Glucose, UA: NEGATIVE mg/dL
Ketones, ur: 15 mg/dL — AB
NITRITE: NEGATIVE
Protein, ur: 30 mg/dL — AB
SPECIFIC GRAVITY, URINE: 1.027 (ref 1.005–1.030)
pH: 8.5 — ABNORMAL HIGH (ref 5.0–8.0)

## 2016-05-20 LAB — LIPASE, BLOOD: Lipase: 19 U/L (ref 11–51)

## 2016-05-20 MED ORDER — SODIUM CHLORIDE 0.9 % IV BOLUS (SEPSIS)
1000.0000 mL | Freq: Once | INTRAVENOUS | Status: AC
  Administered 2016-05-20: 1000 mL via INTRAVENOUS

## 2016-05-20 NOTE — ED Provider Notes (Signed)
[facility]-EMERGENCY DEPT Provider Note   CSN: [id number] Arrival date & time: 05/20/16  1244     History   Chief Complaint Chief Complaint  Patient presents with  . Abdominal Pain    HPI Morgan [name] is a 25 y.o. female.  Patient is a 25 year old female who presents with abdominal pain. She states this morning she noted some cramping to her lower abdomen radiating up her left side. She states she had associated nausea vomiting and diarrhea. She had several episodes of vomiting which was nonbloody and nonbilious. She had 4-5 episodes of watery, nonbloody diarrhea. She states currently she is feeling much better and currently does not have any abdominal pain. She started her menstrual cycle yesterday which was the expected time. She denies any vaginal discharge. She denies any known fevers. No urinary symptoms. She states she frequently has crampy abdominal pain although it's mostly in her upper abdomen. She denies any prior abdominal surgeries.    Abdominal Pain   Associated symptoms include diarrhea, nausea and vomiting. Pertinent negatives include fever, frequency, hematuria, headaches and arthralgias.    Past Medical History:  Diagnosis Date  . ADHD (attention deficit hyperactivity disorder)   . Anxiety   . Asthma   . Depression   . Eczema   . PTSD (post-traumatic stress disorder)   . TMJ (dislocation of temporomandibular joint)     Patient Active Problem List   Diagnosis Date Noted  . MDD (major depressive disorder), recurrent severe, without psychosis (HCC) 05/21/2014  . Bipolar affective disorder, depressed, severe (HCC) 04/09/2014  . Bipolar affective disorder, current episode depressed (HCC) 04/07/2014  . Recurrent major depression-severe (HCC) 08/09/2013  . PTSD (post-traumatic stress disorder) 08/09/2013  . Back pain 06/24/2013  . ADHD (attention deficit hyperactivity disorder) 11/03/2011    Past Surgical History:  Procedure Laterality Date  . addenoid    .  FOOT SURGERY      OB History    No data available       Home Medications    Prior to Admission medications   Medication Sig Start Date End Date Taking? Authorizing Provider  albuterol (VENTOLIN HFA) 108 (90 BASE) MCG/ACT inhaler Inhale 2 puffs into the lungs every 4 (four) hours as needed for wheezing or shortness of breath. 11/19/14  Yes Elvina SidleKurt Lauenstein, MD  clonazePAM (KLONOPIN) 0.5 MG tablet Take 0.5 mg by mouth 2 (two) times daily as needed for anxiety.   Yes Historical Provider, MD  FLUoxetine (PROZAC) 40 MG capsule Take 1 capsule (40 mg total) by mouth daily. 11/19/14  Yes Elvina SidleKurt Lauenstein, MD  phentermine (ADIPEX-P) 37.5 MG tablet Take 37.5 mg by mouth daily before breakfast.   Yes Historical Provider, MD  zolpidem (AMBIEN) 10 MG tablet Take 10 mg by mouth at bedtime as needed for sleep.   Yes Historical Provider, MD  ALPRAZolam (XANAX) 0.25 MG tablet Take 1 tablet (0.25 mg total) by mouth 2 (two) times daily as needed for anxiety. Patient not taking: Reported on 05/20/2016 11/19/14   Elvina SidleKurt Lauenstein, MD  loratadine (CLARITIN) 10 MG tablet Take 1 tablet (10 mg total) by mouth daily. Patient not taking: Reported on 05/20/2016 05/26/14   Adonis BrookSheila Agustin, NP  montelukast (SINGULAIR) 10 MG tablet Take 1 tablet (10 mg total) by mouth daily. Patient not taking: Reported on 11/19/2014 05/26/14   Adonis BrookSheila Agustin, NP    Family History Family History  Problem Relation Age of Onset  . Depression Mother   . Hypertension Mother   . Heart murmur  Mother   . Hypertension Maternal Grandmother   . Hypertension Maternal Grandfather   . Diabetes Maternal Grandfather   . Hypertension Paternal Grandmother   . Diabetes Paternal Grandmother     Social History Social History  Substance Use Topics  . Smoking status: Former Smoker    Packs/day: 0.50    Years: 2.00    Types: Cigarettes  . Smokeless tobacco: Current User  . Alcohol use No     Comment: social / twice a month     Allergies     Food; Peanut-containing drug products; Soy allergy; Geodon [ziprasidone hcl]; Lactose intolerance (gi); Vitamin c; and Sulfa antibiotics   Review of Systems Review of Systems  Constitutional: Negative for chills, diaphoresis, fatigue and fever.  HENT: Negative for congestion, rhinorrhea and sneezing.   Eyes: Negative.   Respiratory: Negative for cough, chest tightness and shortness of breath.   Cardiovascular: Negative for chest pain and leg swelling.  Gastrointestinal: Positive for abdominal pain, diarrhea, nausea and vomiting. Negative for blood in stool.  Genitourinary: Positive for vaginal bleeding. Negative for difficulty urinating, flank pain, frequency, hematuria and vaginal discharge.  Musculoskeletal: Negative for arthralgias and back pain.  Skin: Negative for rash.  Neurological: Negative for dizziness, speech difficulty, weakness, numbness and headaches.     Physical Exam Updated Vital Signs BP 124/70   Pulse 76   Temp 97.6 F (36.4 C) (Oral)   Resp 20   Ht 5\' 5"  (1.651 m)   Wt 210 lb (95.3 kg)   LMP 05/19/2016   SpO2 100%   BMI 34.95 kg/m   Physical Exam  Constitutional: She is oriented to person, place, and time. She appears well-developed and well-nourished.  HENT:  Head: Normocephalic and atraumatic.  Eyes: Pupils are equal, round, and reactive to light.  Neck: Normal range of motion. Neck supple.  Cardiovascular: Normal rate, regular rhythm and normal heart sounds.   Pulmonary/Chest: Effort normal and breath sounds normal. No respiratory distress. She has no wheezes. She has no rales. She exhibits no tenderness.  Abdominal: Soft. Bowel sounds are normal. There is no tenderness. There is no rebound and no guarding.  Musculoskeletal: Normal range of motion. She exhibits no edema.  Lymphadenopathy:    She has no cervical adenopathy.  Neurological: She is alert and oriented to person, place, and time.  Skin: Skin is warm and dry. No rash noted.   Psychiatric: She has a normal mood and affect.     ED Treatments / Results  Labs (all labs ordered are listed, but only abnormal results are displayed) Labs Reviewed  COMPREHENSIVE METABOLIC PANEL - Abnormal; Notable for the following:       Result Value   ALT 13 (*)    All other components within normal limits  URINALYSIS, ROUTINE W REFLEX MICROSCOPIC (NOT AT [facility]) - Abnormal; Notable for the following:    Color, Urine AMBER (*)    APPearance CLOUDY (*)    pH 8.5 (*)    Hgb urine dipstick LARGE (*)    Ketones, ur 15 (*)    Protein, ur 30 (*)    Leukocytes, UA SMALL (*)    All other components within normal limits  URINE MICROSCOPIC-ADD ON - Abnormal; Notable for the following:    Squamous Epithelial / LPF 6-30 (*)    Bacteria, UA FEW (*)    All other components within normal limits  LIPASE, BLOOD  CBC  I-STAT BETA HCG BLOOD, ED ([facility], [facility], [facility] ONLY)  EKG  EKG Interpretation None       Radiology No results found.  Procedures Procedures (including critical care time)  Medications Ordered in ED Medications  sodium chloride 0.9 % bolus 1,000 mL (1,000 mLs Intravenous New Bag/Given 05/20/16 1527)     Initial Impression / Assessment and Plan / ED Course  I have reviewed the triage vital signs and the nursing notes.  Pertinent labs & imaging results that were available during my care of the patient were reviewed by me and considered in my medical decision making (see chart for details).  Clinical Course    Patient presents with abdominal pain. It's fairly diffuse but seem to be more across the lower abdomen in the left side. There is no associated back pain. When I saw her she was completely asymptomatic and feeling much better. She felt a little dehydrated and was given IV fluids. Her labs are non-concerning. She doesn't have any pain over her gallbladder. There is no current pelvic pain and she doesn't present with any pelvic symptoms. She does have some blood in  her urine however this is likely because she's currently on her menstrual cycle. It does look like a contaminated specimen with a lot of squamous cells. She doesn't have any signs of a urinary tract infection. She's currently asymptomatic side don't feel that we need to do imaging studies to look for a kidney stone. I did advise her that if she has recurrent pain she needs to come back for reevaluation and may need a CT scan or other imaging studies at that point. Currently her symptoms are most consistent with a viral syndrome and her pain is completely resolved. She is tolerating oral fluids and has had no vomiting in the ED.  Final Clinical Impressions(s) / ED Diagnoses   Final diagnoses:  Lower abdominal pain    New Prescriptions New Prescriptions   No medications on file     Rolan Bucco, MD 05/20/16 1617

## 2016-05-20 NOTE — ED Triage Notes (Addendum)
Pt c/o abd pain in her LUQ and LLQ that started about an hour ago along with N/V/D that started this morning. Pt stated she started her pd yesterday and she usually has cramping but this pain is worse than before. Pt tearful and guarding during assessment. Pt last BM an hour ago with loose stools. Pt denies any blood in her stool.

## 2016-05-20 NOTE — ED Notes (Signed)
Pt is aware that a urine sample is needed but is unable to obtain one at this time. 

## 2019-01-04 ENCOUNTER — Other Ambulatory Visit: Payer: Self-pay

## 2019-01-04 ENCOUNTER — Emergency Department (HOSPITAL_COMMUNITY)
Admission: EM | Admit: 2019-01-04 | Discharge: 2019-01-04 | Disposition: A | Discharge status: Home / Self Care | Payer: Self-pay | Attending: Emergency Medicine | Admitting: Emergency Medicine

## 2019-01-04 ENCOUNTER — Emergency Department (HOSPITAL_COMMUNITY): Payer: Self-pay

## 2019-01-04 DIAGNOSIS — R102 Pelvic and perineal pain: Secondary | ICD-10-CM | POA: Insufficient documentation

## 2019-01-04 DIAGNOSIS — E669 Obesity, unspecified: Secondary | ICD-10-CM | POA: Insufficient documentation

## 2019-01-04 DIAGNOSIS — N898 Other specified noninflammatory disorders of vagina: Secondary | ICD-10-CM | POA: Insufficient documentation

## 2019-01-04 DIAGNOSIS — Z6837 Body mass index (BMI) 37.0-37.9, adult: Secondary | ICD-10-CM | POA: Insufficient documentation

## 2019-01-04 DIAGNOSIS — Z20828 Contact with and (suspected) exposure to other viral communicable diseases: Secondary | ICD-10-CM | POA: Insufficient documentation

## 2019-01-04 DIAGNOSIS — B349 Viral infection, unspecified: Secondary | ICD-10-CM | POA: Insufficient documentation

## 2019-01-04 DIAGNOSIS — R112 Nausea with vomiting, unspecified: Secondary | ICD-10-CM | POA: Insufficient documentation

## 2019-01-04 LAB — CBC WITH DIFFERENTIAL/PLATELET
Abs Immature Granulocytes: 0.04 10*3/uL (ref 0.00–0.07)
Basophils Absolute: 0 10*3/uL (ref 0.0–0.1)
Basophils Relative: 0 %
Eosinophils Absolute: 0.2 10*3/uL (ref 0.0–0.5)
Eosinophils Relative: 2 %
HCT: 42.7 % (ref 36.0–46.0)
Hemoglobin: 14.3 g/dL (ref 12.0–15.0)
Immature Granulocytes: 0 %
Lymphocytes Relative: 8 %
Lymphs Abs: 0.8 10*3/uL (ref 0.7–4.0)
MCH: 27.9 pg (ref 26.0–34.0)
MCHC: 33.5 g/dL (ref 30.0–36.0)
MCV: 83.2 fL (ref 80.0–100.0)
Monocytes Absolute: 0.4 10*3/uL (ref 0.1–1.0)
Monocytes Relative: 4 %
Neutro Abs: 8.7 10*3/uL — ABNORMAL HIGH (ref 1.7–7.7)
Neutrophils Relative %: 86 %
Platelets: 293 10*3/uL (ref 150–400)
RBC: 5.13 MIL/uL — ABNORMAL HIGH (ref 3.87–5.11)
RDW: 13.9 % (ref 11.5–15.5)
WBC: 10.1 10*3/uL (ref 4.0–10.5)
nRBC: 0 % (ref 0.0–0.2)

## 2019-01-04 LAB — URINALYSIS, ROUTINE W REFLEX MICROSCOPIC
Bilirubin Urine: NEGATIVE
Glucose, UA: NEGATIVE mg/dL
Ketones, ur: 80 mg/dL — AB
Leukocytes,Ua: NEGATIVE
Nitrite: NEGATIVE
Protein, ur: 100 mg/dL — AB
Specific Gravity, Urine: 1.019 (ref 1.005–1.030)
pH: 8 (ref 5.0–8.0)

## 2019-01-04 LAB — COMPREHENSIVE METABOLIC PANEL
ALT: 18 U/L (ref 0–44)
AST: 26 U/L (ref 15–41)
Albumin: 4.4 g/dL (ref 3.5–5.0)
Alkaline Phosphatase: 72 U/L (ref 38–126)
Anion gap: 15 (ref 5–15)
BUN: 5 mg/dL — ABNORMAL LOW (ref 6–20)
CO2: 16 mmol/L — ABNORMAL LOW (ref 22–32)
Calcium: 9.7 mg/dL (ref 8.9–10.3)
Chloride: 105 mmol/L (ref 98–111)
Creatinine, Ser: 0.9 mg/dL (ref 0.44–1.00)
GFR calc Af Amer: >60 mL/min (ref >60)
GFR calc non Af Amer: >60 mL/min (ref >60)
Glucose, Bld: 148 mg/dL — ABNORMAL HIGH (ref 70–99)
Potassium: 3.2 mmol/L — ABNORMAL LOW (ref 3.5–5.1)
Sodium: 136 mmol/L (ref 135–145)
Total Bilirubin: 0.6 mg/dL (ref 0.3–1.2)
Total Protein: 8.4 g/dL — ABNORMAL HIGH (ref 6.5–8.1)

## 2019-01-04 LAB — WET PREP, GENITAL
Clue Cells Wet Prep HPF POC: NONE SEEN
Sperm: NONE SEEN
Trich, Wet Prep: NONE SEEN
Yeast Wet Prep HPF POC: NONE SEEN

## 2019-01-04 LAB — I-STAT BETA HCG BLOOD, ED (MC, WL, AP ONLY): I-stat hCG, quantitative: <5 m[IU]/mL (ref <5)

## 2019-01-04 LAB — CBG MONITORING, ED: Glucose-Capillary: 133 mg/dL — ABNORMAL HIGH (ref 70–99)

## 2019-01-04 LAB — LIPASE, BLOOD: Lipase: 24 U/L (ref 11–51)

## 2019-01-04 MED ORDER — DIPHENHYDRAMINE HCL 50 MG/ML IJ SOLN
25.0000 mg | Freq: Once | INTRAMUSCULAR | Status: AC
  Administered 2019-01-04: 25 mg via INTRAVENOUS
  Filled 2019-01-04: qty 1

## 2019-01-04 MED ORDER — PROCHLORPERAZINE EDISYLATE 10 MG/2ML IJ SOLN
10.0000 mg | Freq: Once | INTRAMUSCULAR | Status: AC
  Administered 2019-01-04: 10 mg via INTRAVENOUS
  Filled 2019-01-04: qty 2

## 2019-01-04 MED ORDER — KETOROLAC TROMETHAMINE 15 MG/ML IJ SOLN
15.0000 mg | Freq: Once | INTRAMUSCULAR | Status: AC
  Administered 2019-01-04: 15 mg via INTRAVENOUS
  Filled 2019-01-04: qty 1

## 2019-01-04 MED ORDER — ALUM & MAG HYDROXIDE-SIMETH 200-200-20 MG/5ML PO SUSP
15.0000 mL | Freq: Once | ORAL | Status: AC
  Administered 2019-01-04: 15 mL via ORAL
  Filled 2019-01-04: qty 30

## 2019-01-04 MED ORDER — SODIUM CHLORIDE 0.9 % IV BOLUS
1000.0000 mL | Freq: Once | INTRAVENOUS | Status: AC
  Administered 2019-01-04: 1000 mL via INTRAVENOUS

## 2019-01-04 NOTE — ED Triage Notes (Signed)
Pt POV d/t having SOB, N/V that started this AM

## 2019-01-04 NOTE — ED Notes (Signed)
Pt called out stating that she was feeling better and that she was ready to leave- will inform provider

## 2019-01-04 NOTE — ED Provider Notes (Signed)
[facility] EMERGENCY DEPARTMENT Provider Note   CSN: [id number] Arrival date & time: 01/04/19  7253    History   Chief Complaint Chief Complaint  Patient presents with  . Nausea/weakness  . Shortness of Breath    HPI Morgan [name] is a 28 y.o. female.     28 yo F with multiple complaints.  Going on for about 7 hours.  Having pelvic pain.  N/v/d.  Sob.  Diffuse muscle aches.  No cough, no fevers.   The history is provided by the patient.  Shortness of Breath  Severity:  Moderate Onset quality:  Sudden Duration:  6 minutes Timing:  Constant Progression:  Worsening Chronicity:  New Relieved by:  Nothing Worsened by:  Nothing Ineffective treatments:  None tried Associated symptoms: abdominal pain and vomiting   Associated symptoms: no chest pain, no fever, no headaches and no wheezing     Past Medical History:  Diagnosis Date  . ADHD (attention deficit hyperactivity disorder)   . Anxiety   . Asthma   . Depression   . Eczema   . PTSD (post-traumatic stress disorder)   . TMJ (dislocation of temporomandibular joint)     Patient Active Problem List   Diagnosis Date Noted  . MDD (major depressive disorder), recurrent severe, without psychosis (HCC) 05/21/2014  . Bipolar affective disorder, depressed, severe (HCC) 04/09/2014  . Bipolar affective disorder, current episode depressed (HCC) 04/07/2014  . Recurrent major depression-severe (HCC) 08/09/2013  . PTSD (post-traumatic stress disorder) 08/09/2013  . Back pain 06/24/2013  . ADHD (attention deficit hyperactivity disorder) 11/03/2011    Past Surgical History:  Procedure Laterality Date  . addenoid    . FOOT SURGERY       OB History   No obstetric history on file.      Home Medications    Prior to Admission medications   Medication Sig Start Date End Date Taking? Authorizing Provider  albuterol (VENTOLIN HFA) 108 (90 BASE) MCG/ACT inhaler Inhale 2 puffs into the lungs every 4  (four) hours as needed for wheezing or shortness of breath. 11/19/14   [name], MD  ALPRAZolam Prudy Feeler) 0.25 MG tablet Take 1 tablet (0.25 mg total) by mouth 2 (two) times daily as needed for anxiety. Patient not taking: Reported on 05/20/2016 11/19/14   [name], MD  clonazePAM (KLONOPIN) 0.5 MG tablet Take 0.5 mg by mouth 2 (two) times daily as needed for anxiety.    [provider]  FLUoxetine (PROZAC) 40 MG capsule Take 1 capsule (40 mg total) by mouth daily. 11/19/14   [name], MD  loratadine (CLARITIN) 10 MG tablet Take 1 tablet (10 mg total) by mouth daily. Patient not taking: Reported on 05/20/2016 05/26/14   Adonis Brook, NP  montelukast (SINGULAIR) 10 MG tablet Take 1 tablet (10 mg total) by mouth daily. Patient not taking: Reported on 11/19/2014 05/26/14   Adonis Brook, NP  phentermine (ADIPEX-P) 37.5 MG tablet Take 37.5 mg by mouth daily before breakfast.    [provider]  zolpidem (AMBIEN) 10 MG tablet Take 10 mg by mouth at bedtime as needed for sleep.    [provider]    Family History Family History  Problem Relation Age of Onset  . Depression Mother   . Hypertension Mother   . Heart murmur Mother   . Hypertension Maternal Grandmother   . Hypertension Maternal Grandfather   . Diabetes Maternal Grandfather   . Hypertension Paternal Grandmother   . Diabetes Paternal  Grandmother     Social History Social History   Tobacco Use  . Smoking status: Former Smoker    Packs/day: 0.50    Years: 2.00    Pack years: 1.00    Types: Cigarettes  . Smokeless tobacco: Current User  Substance Use Topics  . Alcohol use: No    Comment: social / twice a month  . Drug use: No    Types: Marijuana, Cocaine    Comment: last used 19 days ago, recently started using cocaine     Allergies   Food; Peanut-containing drug products; Soy allergy; Geodon [ziprasidone hcl]; Lactose intolerance (gi); Vitamin c; and Sulfa antibiotics    Review of Systems Review of Systems  Constitutional: Negative for chills and fever.  HENT: Negative for congestion and rhinorrhea.   Eyes: Negative for redness and visual disturbance.  Respiratory: Positive for shortness of breath. Negative for wheezing.   Cardiovascular: Negative for chest pain and palpitations.  Gastrointestinal: Positive for abdominal pain, diarrhea, nausea and vomiting.  Genitourinary: Positive for pelvic pain. Negative for dysuria and urgency.  Musculoskeletal: Positive for arthralgias and myalgias.  Skin: Negative for pallor and wound.  Neurological: Negative for dizziness and headaches.     Physical Exam Updated Vital Signs BP (!) 144/76   Pulse 66   Temp 97.7 F (36.5 C) (Oral)   Ht  (1.676 m)   Wt 104.3 kg   SpO2 100%   BMI 37.12 kg/m   Physical Exam Vitals signs and nursing note reviewed.  Constitutional:      General: She is not in acute distress.    Appearance: She is well-developed. She is obese. She is not diaphoretic.  HENT:     Head: Normocephalic and atraumatic.  Eyes:     Pupils: Pupils are equal, round, and reactive to light.  Neck:     Musculoskeletal: Normal range of motion and neck supple.  Cardiovascular:     Rate and Rhythm: Normal rate and regular rhythm.     Heart sounds: No murmur. No friction rub. No gallop.   Pulmonary:     Effort: Pulmonary effort is normal.     Breath sounds: No wheezing or rales.  Abdominal:     General: There is no distension.     Palpations: Abdomen is soft.     Tenderness: There is no abdominal tenderness.     Comments: Benign abdominal exam  Genitourinary:    Comments: Brownish copious discharge.  Foul-smelling.  No cervical motion tenderness no adnexal tenderness or masses Musculoskeletal:        General: No tenderness.  Skin:    General: Skin is warm and dry.  Neurological:     Mental Status: She is alert and oriented to person, place, and time.  Psychiatric:        Behavior:  Behavior normal.      ED Treatments / Results  Labs (all labs ordered are listed, but only abnormal results are displayed) Labs Reviewed  WET PREP, GENITAL - Abnormal; Notable for the following components:      Result Value   WBC, Wet Prep HPF POC MANY (*)    All other components within normal limits  CBC WITH DIFFERENTIAL/PLATELET - Abnormal; Notable for the following components:   RBC 5.13 (*)    Neutro Abs 8.7 (*)    All other components within normal limits  COMPREHENSIVE METABOLIC PANEL - Abnormal; Notable for the following components:   Potassium 3.2 (*)    CO2 16 (*)  Glucose, Bld 148 (*)    BUN 5 (*)    Total Protein 8.4 (*)    All other components within normal limits  URINALYSIS, ROUTINE W REFLEX MICROSCOPIC - Abnormal; Notable for the following components:   Hgb urine dipstick SMALL (*)    Ketones, ur 80 (*)    Protein, ur 100 (*)    Bacteria, UA RARE (*)    All other components within normal limits  CBG MONITORING, ED - Abnormal; Notable for the following components:   Glucose-Capillary 133 (*)    All other components within normal limits  NOVEL CORONAVIRUS, NAA (HOSPITAL ORDER, SEND-OUT TO REF LAB)  LIPASE, BLOOD  I-STAT BETA HCG BLOOD, ED ([facility], [facility], [facility] ONLY)  GC/CHLAMYDIA PROBE AMP ([place]) NOT AT [facility]    EKG EKG Interpretation  Date/Time:  Friday January 04 2019 08:04:26 EDT Ventricular Rate:  82 PR Interval:  162 QRS Duration: 92 QT Interval:  450 QTC Calculation: 525 R Axis:   66 Text Interpretation:  Normal sinus rhythm Nonspecific T wave abnormality Prolonged QT Abnormal ECG Otherwise no significant change Confirmed by Melene PlanFloyd, Caisley [place] [phone]([id number]) on 01/04/2019 8:13:49 AM   Radiology Dg Chest Port 1 View  Result Date: 01/04/2019 CLINICAL DATA:  28 year old female with shortness of breath, nausea vomiting. EXAM: PORTABLE CHEST 1 VIEW COMPARISON:  12/31/2013 and earlier. FINDINGS: Portable AP upright view at 0811 hours. Lower lung volumes with mild diffuse  increased pulmonary interstitial opacity. Normal cardiac size and mediastinal contours. Visualized tracheal air column is within normal limits. No pneumothorax, pleural effusion or confluent pulmonary opacity. Paucity of bowel gas in the upper abdomen. No osseous abnormality identified. IMPRESSION: Lower lung volumes with mild diffuse increased pulmonary interstitial opacity. Consider interstitial edema and acute viral/atypical respiratory infection. Electronically Signed   By: Odessa FlemingH  Hall M.D.   On: 01/04/2019 08:40    Procedures Procedures (including critical care time)  Medications Ordered in ED Medications  sodium chloride 0.9 % bolus 1,000 mL (0 mLs Intravenous Stopped 01/04/19 0959)  prochlorperazine (COMPAZINE) injection 10 mg (10 mg Intravenous Given 01/04/19 0810)  diphenhydrAMINE (BENADRYL) injection 25 mg (25 mg Intravenous Given 01/04/19 0809)  ketorolac (TORADOL) 15 MG/ML injection 15 mg (15 mg Intravenous Given 01/04/19 0859)  alum & mag hydroxide-simeth (MAALOX/MYLANTA) 200-200-20 MG/5ML suspension 15 mL (15 mLs Oral Given 01/04/19 0859)     Initial Impression / Assessment and Plan / ED Course  I have reviewed the triage vital signs and the nursing notes.  Pertinent labs & imaging results that were available during my care of the patient were reviewed by me and considered in my medical decision making (see chart for details).        28 yo F with a chief complaint of diffuse myalgias nausea vomiting and diarrhea.  This been going on since the middle the night tonight.  She states that she normally is awake at that time.  Denies vaginal bleeding or discharge.  Denies likelihood of being pregnant.  Will obtain a laboratory evaluation and treat her nausea reassess.  The wet prep is with white blood cells but no other finding.  Without cervical motion tenderness so we will hold off on antibiotics.  Her urine is also negative for infection.  Chest x-ray concerning for atypical viral pneumonia.   Will send off an outpatient COVID test.  Have her self isolate.  Return for worsening shortness of breath.  10:13 AM:  I have discussed the diagnosis/risks/treatment options with the patient and believe the  pt to be eligible for discharge home to follow-up with PCP. We also discussed returning to the ED immediately if new or worsening sx occur. We discussed the sx which are most concerning (e.g., sudden worsening pain, fever, inability to tolerate by mouth, worsening sob) that necessitate immediate return. Medications administered to the patient during their visit and any new prescriptions provided to the patient are listed below.  Medications given during this visit Medications  sodium chloride 0.9 % bolus 1,000 mL (0 mLs Intravenous Stopped 01/04/19 0959)  prochlorperazine (COMPAZINE) injection 10 mg (10 mg Intravenous Given 01/04/19 0810)  diphenhydrAMINE (BENADRYL) injection 25 mg (25 mg Intravenous Given 01/04/19 0809)  ketorolac (TORADOL) 15 MG/ML injection 15 mg (15 mg Intravenous Given 01/04/19 0859)  alum & mag hydroxide-simeth (MAALOX/MYLANTA) 200-200-20 MG/5ML suspension 15 mL (15 mLs Oral Given 01/04/19 0859)     The patient appears reasonably screen and/or stabilized for discharge and I doubt any other medical condition or other [facility] requiring further screening, evaluation, or treatment in the ED at this time prior to discharge.    Final Clinical Impressions(s) / ED Diagnoses   Final diagnoses:  Viral syndrome  Pelvic pain    ED Discharge Orders    None       Melene Plan, DO 01/04/19 1014

## 2019-01-04 NOTE — ED Triage Notes (Signed)
Pt in with emesis x 6 hrs, states she is unable to keep anything down. Hx of pre-diabetes, feels weak and thinks her sugar is low

## 2019-01-04 NOTE — ED Notes (Signed)
Pt ripped out her IV, informed RN she wants to leave due to not being able to sleep in ED. RN awaiting MD consultation

## 2019-01-04 NOTE — Discharge Instructions (Signed)
Your chest x-ray looks like a viral pneumonia.  Unfortunately with the current pandemic there must be a concern for the novel coronavirus.  Please try to limit contact with anyone else.  Return for worsening shortness of breath.     Person Under Monitoring Name: Morgan [name]  Location: [id number] [place] [address]   Infection Prevention Recommendations for Individuals Confirmed to have, or Being Evaluated for, 2019 Novel Coronavirus (COVID-19) Infection Who Receive Care at Home  Individuals who are confirmed to have, or are being evaluated for, COVID-19 should follow the prevention steps below until a healthcare provider or local or state health department says they can return to normal activities.  Stay home except to get medical care You should restrict activities outside your home, except for getting medical care. Do not go to work, school, or public areas, and do not use public transportation or taxis.  Call ahead before visiting your doctor Before your medical appointment, call the healthcare provider and tell them that you have, or are being evaluated for, COVID-19 infection. This will help the healthcare providers office take steps to keep other people from getting infected. Ask your healthcare provider to call the local or state health department.  Monitor your symptoms Seek prompt medical attention if your illness is worsening (e.g., difficulty breathing). Before going to your medical appointment, call the healthcare provider and tell them that you have, or are being evaluated for, COVID-19 infection. Ask your healthcare provider to call the local or state health department.  Wear a facemask You should wear a facemask that covers your nose and mouth when you are in the same room with other people and when you visit a healthcare provider. People who live with or visit you should also wear a facemask while they are in the same room with  you.  Separate yourself from other people in your home As much as possible, you should stay in a different room from other people in your home. Also, you should use a separate bathroom, if available.  Avoid sharing household items You should not share dishes, drinking glasses, cups, eating utensils, towels, bedding, or other items with other people in your home. After using these items, you should wash them thoroughly with soap and water.  Cover your coughs and sneezes Cover your mouth and nose with a tissue when you cough or sneeze, or you can cough or sneeze into your sleeve. Throw used tissues in a lined trash can, and immediately wash your hands with soap and water for at least 20 seconds or use an alcohol-based hand rub.  Wash your [employer] your hands often and thoroughly with soap and water for at least 20 seconds. You can use an alcohol-based hand sanitizer if soap and water are not available and if your hands are not visibly dirty. Avoid touching your eyes, nose, and mouth with unwashed hands.   Prevention Steps for Caregivers and Household Members of Individuals Confirmed to have, or Being Evaluated for, COVID-19 Infection Being Cared for in the Home  If you live with, or provide care at home for, a person confirmed to have, or being evaluated for, COVID-19 infection please follow these guidelines to prevent infection:  Follow healthcare providers instructions Make sure that you understand and can help the patient follow any healthcare provider instructions for all care.  Provide for the patients basic needs You should help the patient with basic needs in the home and provide support  for getting groceries, prescriptions, and other personal needs.  Monitor the patients symptoms If they are getting sicker, call his or her medical provider and tell them that the patient has, or is being evaluated for, COVID-19 infection. This will help the healthcare providers office  take steps to keep other people from getting infected. Ask the healthcare provider to call the local or state health department.  Limit the number of people who have contact with the patient If possible, have only one caregiver for the patient. Other household members should stay in another home or place of residence. If this is not possible, they should stay in another room, or be separated from the patient as much as possible. Use a separate bathroom, if available. Restrict visitors who do not have an essential need to be in the home.  Keep older adults, very young children, and other sick people away from the patient Keep older adults, very young children, and those who have compromised immune systems or chronic health conditions away from the patient. This includes people with chronic heart, lung, or kidney conditions, diabetes, and cancer.  Ensure good ventilation Make sure that shared spaces in the home have good air flow, such as from an air conditioner or an opened window, weather permitting.  Wash your hands often Wash your hands often and thoroughly with soap and water for at least 20 seconds. You can use an alcohol based hand sanitizer if soap and water are not available and if your hands are not visibly dirty. Avoid touching your eyes, nose, and mouth with unwashed hands. Use disposable paper towels to dry your hands. If not available, use dedicated cloth towels and replace them when they become wet.  Wear a facemask and gloves Wear a disposable facemask at all times in the room and gloves when you touch or have contact with the patients blood, body fluids, and/or secretions or excretions, such as sweat, saliva, sputum, nasal mucus, vomit, urine, or feces.  Ensure the mask fits over your nose and mouth tightly, and do not touch it during use. Throw out disposable facemasks and gloves after using them. Do not reuse. Wash your hands immediately after removing your facemask and  gloves. If your personal clothing becomes contaminated, carefully remove clothing and launder. Wash your hands after handling contaminated clothing. Place all used disposable facemasks, gloves, and other waste in a lined container before disposing them with other household waste. Remove gloves and wash your hands immediately after handling these items.  Do not share dishes, glasses, or other household items with the patient Avoid sharing household items. You should not share dishes, drinking glasses, cups, eating utensils, towels, bedding, or other items with a patient who is confirmed to have, or being evaluated for, COVID-19 infection. After the person uses these items, you should wash them thoroughly with soap and water.  Wash laundry thoroughly Immediately remove and wash clothes or bedding that have blood, body fluids, and/or secretions or excretions, such as sweat, saliva, sputum, nasal mucus, vomit, urine, or feces, on them. Wear gloves when handling laundry from the patient. Read and follow directions on labels of laundry or clothing items and detergent. In general, wash and dry with the warmest temperatures recommended on the label.  Clean all areas the individual has used often Clean all touchable surfaces, such as counters, tabletops, doorknobs, bathroom fixtures, toilets, phones, keyboards, tablets, and bedside tables, every day. Also, clean any surfaces that may have blood, body fluids, and/or secretions or excretions on  them. Wear gloves when cleaning surfaces the patient has come in contact with. Use a diluted bleach solution (e.g., dilute bleach with 1 part bleach and 10 parts water) or a household disinfectant with a label that says EPA-registered for coronaviruses. To make a bleach solution at home, add 1 tablespoon of bleach to 1 quart (4 cups) of water. For a larger supply, add  cup of bleach to 1 gallon (16 cups) of water. Read labels of cleaning products and follow  recommendations provided on product labels. Labels contain instructions for safe and effective use of the cleaning product including precautions you should take when applying the product, such as wearing gloves or eye protection and making sure you have good ventilation during use of the product. Remove gloves and wash hands immediately after cleaning.  Monitor yourself for signs and symptoms of illness Caregivers and household members are considered close contacts, should monitor their health, and will be asked to limit movement outside of the home to the extent possible. Follow the monitoring steps for close contacts listed on the symptom monitoring form.   ? If you have additional questions, contact your local health department or call the epidemiologist on call at [phone] (available 24/7). ? This guidance is subject to change. For the most up-to-date guidance from [facility], please refer to their website: [email or web address]

## 2019-01-05 LAB — NOVEL CORONAVIRUS, NAA (HOSP ORDER, SEND-OUT TO REF LAB; TAT 18-24 HRS): SARS-CoV-2, NAA: NOT DETECTED

## 2019-01-07 LAB — GC/CHLAMYDIA PROBE AMP (~~LOC~~) NOT AT ARMC
Chlamydia: NEGATIVE
Neisseria Gonorrhea: NEGATIVE
